# Patient Record
Sex: Female | Born: 1960 | Race: White | Hispanic: No | Marital: Married | Smoking: Never smoker
Health system: Southern US, Community
[De-identification: ages and names within clinical notes are randomized; demographics above are authoritative.]

## PROBLEM LIST (undated history)

## (undated) DIAGNOSIS — G2581 Restless legs syndrome: Secondary | ICD-10-CM

## (undated) DIAGNOSIS — K219 Gastro-esophageal reflux disease without esophagitis: Secondary | ICD-10-CM

## (undated) DIAGNOSIS — R000 Tachycardia, unspecified: Secondary | ICD-10-CM

## (undated) DIAGNOSIS — J45909 Unspecified asthma, uncomplicated: Secondary | ICD-10-CM

## (undated) DIAGNOSIS — G43909 Migraine, unspecified, not intractable, without status migrainosus: Secondary | ICD-10-CM

## (undated) DIAGNOSIS — C4491 Basal cell carcinoma of skin, unspecified: Secondary | ICD-10-CM

## (undated) DIAGNOSIS — F419 Anxiety disorder, unspecified: Secondary | ICD-10-CM

## (undated) HISTORY — DX: Anxiety disorder, unspecified: F41.9

## (undated) HISTORY — PX: ABDOMINAL HYSTERECTOMY: SHX81

## (undated) HISTORY — PX: TONSILLECTOMY: SUR1361

## (undated) HISTORY — DX: Migraine, unspecified, not intractable, without status migrainosus: G43.909

## (undated) HISTORY — DX: Gastro-esophageal reflux disease without esophagitis: K21.9

## (undated) HISTORY — DX: Basal cell carcinoma of skin, unspecified: C44.91

---

## 1999-05-15 ENCOUNTER — Other Ambulatory Visit: Admission: RE | Admit: 1999-05-15 | Discharge: 1999-05-15 | Payer: Self-pay | Admitting: Gynecology

## 2002-04-15 ENCOUNTER — Other Ambulatory Visit: Admission: RE | Admit: 2002-04-15 | Discharge: 2002-04-15 | Payer: Self-pay | Admitting: Obstetrics and Gynecology

## 2002-04-22 ENCOUNTER — Encounter: Payer: Self-pay | Admitting: Obstetrics and Gynecology

## 2002-04-22 ENCOUNTER — Ambulatory Visit (HOSPITAL_COMMUNITY): Admission: RE | Admit: 2002-04-22 | Discharge: 2002-04-22 | Payer: Self-pay | Admitting: Obstetrics and Gynecology

## 2004-09-06 ENCOUNTER — Other Ambulatory Visit: Admission: RE | Admit: 2004-09-06 | Discharge: 2004-09-06 | Payer: Self-pay | Admitting: Obstetrics and Gynecology

## 2004-10-22 ENCOUNTER — Encounter (INDEPENDENT_AMBULATORY_CARE_PROVIDER_SITE_OTHER): Payer: Self-pay | Admitting: *Deleted

## 2004-10-22 ENCOUNTER — Inpatient Hospital Stay (HOSPITAL_COMMUNITY): Admission: RE | Admit: 2004-10-22 | Discharge: 2004-10-24 | Payer: Self-pay | Admitting: Obstetrics and Gynecology

## 2004-11-27 ENCOUNTER — Ambulatory Visit (HOSPITAL_COMMUNITY): Admission: RE | Admit: 2004-11-27 | Discharge: 2004-11-27 | Payer: Self-pay | Admitting: Obstetrics and Gynecology

## 2010-02-28 ENCOUNTER — Observation Stay (HOSPITAL_COMMUNITY): Admission: EM | Admit: 2010-02-28 | Discharge: 2010-02-28 | Payer: Self-pay | Admitting: Emergency Medicine

## 2010-12-09 LAB — POCT CARDIAC MARKERS
CKMB, poc: <1 ng/mL — ABNORMAL LOW (ref 1.0–8.0)
CKMB, poc: <1 ng/mL — ABNORMAL LOW (ref 1.0–8.0)
Myoglobin, poc: 55.1 ng/mL (ref 12–200)
Myoglobin, poc: 70.6 ng/mL (ref 12–200)
Troponin i, poc: <0.05 ng/mL (ref 0.00–0.09)
Troponin i, poc: <0.05 ng/mL (ref 0.00–0.09)

## 2011-02-07 NOTE — Op Note (Signed)
NAMENAJEE, Claudia Vega               ACCOUNT NO.:  [id number]   MEDICAL RECORD NO.:  [id number]          PATIENT TYPE:  AMB   LOCATION:  SDC                           FACILITY:  [facility]   PHYSICIAN:  Zenaida Niece, M.D.DATE OF BIRTH:  1960/11/24   DATE OF PROCEDURE:  11/27/2004  DATE OF DISCHARGE:                                 OPERATIVE REPORT   PREOPERATIVE AND POSTOPERATIVE DIAGNOSES:  Dysuria and hematuria.   PROCEDURES:  Cystoscopy.   SURGEON:  Zenaida Niece, M.D.   ANESTHESIA:  Monitored anesthesia care.   ESTIMATED BLOOD LOSS:  Minimal.   FINDINGS:  The patient had a normal bladder with a slightly stenotic  urethra. No sutures were seen in the bladder or urethra.   PROCEDURE IN DETAIL:  The patient was taken to the operating room and placed  in the dorsosupine position. She was given IV sedation and placed in mobile  stirrups. Perineum was then prepped and draped in the usual sterile fashion  and bladder drained with a latex-free catheter. The 70 cystoscope was then  inserted and the bladder filled with sterile fluid. Good visualization was  achieved. The entire bladder was visualized including the ureteral orifices  and no sutures were seen in the bladder. The length of the urethra was also  inspected and no sutures were seen there. The urethra was slightly stenotic  and did have some bleeding from the cystoscopy. The cystoscope was then  removed and the bladder drained with the same latex-free catheter. The  patient was awakened in the operating room, tolerated the procedure well and  was taken to the recovery room in stable condition. Counts were correct.      TDM/MEDQ  D:  11/27/2004  T:  11/27/2004  Job:  [id number]

## 2011-02-07 NOTE — H&P (Signed)
NAMESYNCERE, KAMINSKI NO.:  [id number]   MEDICAL RECORD NO.:  [id number]           PATIENT TYPE:   LOCATION:                                 FACILITY:   PHYSICIAN:  Zenaida Niece, M.D.     DATE OF BIRTH:   DATE OF ADMISSION:  11/27/2004  DATE OF DISCHARGE:                                HISTORY & PHYSICAL   CHIEF COMPLAINT:  Persistent dysuria and hematuria, status post Burch  urethropexy.   HISTORY OF PRESENT ILLNESS:  This is a 50 year old white female, para 0-0-2-  0, who was admitted on October 22, 2004, and had a total abdominal  hysterectomy and Burch urethropexy with cystoscopy performed.  Postoperatively, she initially did well; however, she has had some fairly  persistent urinary symptoms.  She did have a urinary tract infection which  was treated with Septra; however, she continues to have dysuria and  hematuria.  A recent urine culture has returned normal.  She also complained  of flank pain.  Labs performed in the office reveal a normal white count of  8, a normal hemoglobin of 13.9, normal platelets at 245,000.  Her basic  metabolic profile was normal with a creatinine of 0.8, and urine culture  performed at that time on November 22, 2004 was negative.  Due to her persistent  urinary symptoms, and the fact that she is status post a Burch urethropexy,  I am going to take her to the operating room for cystoscopy to look for any  sutures in the bladder or urethra.   PAST OBSTETRICAL HISTORY:  Two spontaneous abortion, and a history of  infertility.   PAST MEDICAL HISTORY:  1.  Asthma.  2.  Gastroesophageal reflux disease.  3.  Restless leg syndrome.   PAST SURGICAL HISTORY:  1.  Hysteroscopy and D&C.  2.  The above-mentioned total abdominal hysterectomy and Burch procedure.   ALLERGIES:  CODEINE.   MEDICATIONS:  1.  Advair.  2.  Flonase.  3.  Singulair.  4.  Over-the-counter Alavert.  5.  Nexium.  6.  Requip.   SOCIAL HISTORY:  The  patient is married, and denies significant alcohol,  tobacco, or drug use.   FAMILY HISTORY:  No GYN or colon cancer.   PHYSICAL EXAMINATION:  VITAL SIGNS:  Weight is approximately 165 pounds.  GENERAL:  This is a well-developed, well-nourished, white female who is in  no acute distress.  NECK:  Supple without lymphadenopathy or thyromegaly.  LUNGS:  Clear to auscultation.  HEART:  Regular rate and rhythm without murmur.  ABDOMEN:  Benign with a transverse incision that is healing well.  PELVIC:  Deferred at this point due to her recent postoperative status.   ASSESSMENT:  Persistent postoperative urinary symptoms, as well as  hematuria.  She did have a Burch urethropexy, and I did perform a cystoscopy  after that procedure, and did not notice any sutures in the bladder or  urethra at that time.  However, due to her persistent symptoms and recent  negative urine culture, I am suspicious that she has a suture  in the bladder  or urethra.  The patient understands the risks of cystoscopy.   PLAN:  The plan is to take the patient to the operating room and perform  cystoscopy with possible cutting of a suture if one is present.      TDM/MEDQ  D:  11/26/2004  T:  11/26/2004  Job:  [id number]

## 2011-02-07 NOTE — Op Note (Signed)
Claudia Vega, Claudia Vega               ACCOUNT NO.:  [id number]   MEDICAL RECORD NO.:  [id number]          PATIENT TYPE:  INP   LOCATION:  9399                          FACILITY:  [facility]   PHYSICIAN:  Zenaida Niece, M.D.DATE OF BIRTH:  [birth year]   DATE OF PROCEDURE:  10/22/2004  DATE OF DISCHARGE:                                 OPERATIVE REPORT   PREOPERATIVE DIAGNOSES:  Symptomatic leiomyomatous uterus and stress urinary  incontinence.   POSTOPERATIVE DIAGNOSES:  Symptomatic leiomyomatous uterus and stress  urinary incontinence.   PROCEDURE:  Total abdominal hysterectomy and Burch urethropexy with  cystoscopy.   SURGEON:  Zenaida Niece, M.D.   ASSISTANT:  Malachi Pro. Ambrose Mantle, M.D.   ANESTHESIA:  General endotracheal tube.   ESTIMATED BLOOD LOSS:  150 cc.   SPECIMENS:  Uterus.   FINDINGS:  Approximately 10 weeks size irregular uterus with normal tubes  and ovaries. Otherwise a small serosal tear of the sigmoid found after  packing the bowel back.   PROCEDURE IN DETAIL:  The patient was taken to the operating room and placed  in the dorsosupine position. General anesthesia was induced and she was  placed in low lithotomy position in mobile stirrups. Abdomen, perineum and  vagina were then prepped and draped in the usual sterile fashion and a Foley  catheter inserted. Abdomen was then entered via standard Pfannenstiel  incision. A self-retaining retractor was placed and bowels packed out of the  pelvis. Again a small serosal tear of the sigmoid colon was noticed upon  packing the bowels out of the pelvis. The uterus was inspected and found to  be approximately 10 weeks size and irregular, but very mobile. Tubes and  ovaries were normal. Kelly clamps were placed in each uterine cornu. Both  round ligaments were divided with electrocautery and the anterior portion of  the broad ligament was dissected across the anterior portion of the uterus  and the bladder was pushed  inferior. A window was made in an avascular  portion of the broad ligament and Zeppelin clamps were used to clamp the  utero-ovarian pedicles. These were transected and doubly ligated with #1  chromic. Uterine arteries were skeletonized and clamped with Zeppelin  clamps, transected and ligated with #1 chromic. Bladder was pushed inferior  and the cardinal ligaments and uterosacral ligaments were clamped,  transected and ligated on each side with #1 chromic. Vaginal angles were  clamped with Zeppelin clamps and cut and the vagina was entered. The  remainder of the uterus and cervix was removed intact. Vaginal angles were  sutured with #1 chromic and tagged for later use. The remaining vagina was  closed with interrupted figure-of-eight sutures of #1 chromic with good  closure and adequate hemostasis. Bladder was pushed well inferior during the  case and was not felt to be near any suture lines. Uterosacral ligaments  were then plicated in the midline with 2-0 silk and the previously tagged  uterosacral pedicles were also tied in the midline. Pelvis was irrigated and  found to be hemostatic. All pedicles were inspected and found to be  hemostatic.  Ureters appeared well below any incision lines.   Attention was turned to the Burch procedure. The space of Retzius was  developed bluntly and the Foley bulb was palpated. I put an overglove on my  left hand and placed this in the vagina. Using this as a guide, I placed two  sutures of 0 Ethibond suture on each side of the urethra. These were then  tagged. I then removed this glove. The abdomen was covered and cystoscopy  was performed. This was done with a 70 degree cystoscope and the bladder  appeared normal and I was unable to identify any sutures in the bladder.  This was done after the Foley catheter was removed. Foley catheter was then  replaced. I changed my gloves and reentered the surgical field. The Burch  sutures were then placed through  Cooper's ligament on each side and tied to  elevate the bladder neck, but not under undue tension. Bleeding at the entry  site of one of the sutures on the right side into Cooper's ligament was  controlled with a figure-of-eight suture of 3-0 Vicryl. Space of Retzius was  then inspected and found to be hemostatic. The serosal tear of the sigmoid  was then repaired with several interrupted sutures of 0 silk with good  closure and good hemostasis. The packings were then removed from the abdomen  and the remainder of the bowel found to be intact. The self-retaining  retractor was removed. Subfascial space was inspected and made hemostatic  with electrocautery. Fascia was closed in running fashion starting at both  ends and meeting in the middle with 0 Vicryl. Subcutaneous tissue was then  irrigated and made hemostatic with electrocautery. Subcutaneous tissue was  closed with running 2-0 plain gut suture on a large needle. Skin was then  closed with staples and a sterile dressing. The patient tolerated the  procedure well and was taken to recovery room in stable condition. Counts  were correct x2, she received Ancef 1 gram prior to the procedure, she had  PAS hose on throughout procedure.      TDM/MEDQ  D:  10/22/2004  T:  10/22/2004  Job:  [id number]

## 2011-02-07 NOTE — H&P (Signed)
NAMEBRADLEE, Claudia Vega               ACCOUNT NO.:  [id number]   MEDICAL RECORD NO.:  [id number]          PATIENT TYPE:  INP   LOCATION:  NA                            FACILITY:  [facility]   PHYSICIAN:  Zenaida Niece, M.D.DATE OF BIRTH:  October 15, 1960   DATE OF ADMISSION:  10/22/2004  DATE OF DISCHARGE:                                HISTORY & PHYSICAL   CHIEF COMPLAINT:  Symptomatic leiomyomatous uterus and stress incontinence.   HISTORY OF PRESENT ILLNESS:  This is a 50 year old white female, gravida 2,  para 0-0-2-0, whom I saw for an annual exam in December of 2005.  She  reports that she has regular menses that are slightly heavy.  She was also  mildly anemic with a hemoglobin of 11.4.  On physical exam, she had a 10-12  week size, slightly irregular, non-tender uterus, and a grade I urethrocele  with a poor Kegel exercise.  All nonsurgical and surgical options were  discussed with the patient for her fibroid uterus, and the patient wishes to  undergo definitive surgical therapy with hysterectomy.   PAST OBSTETRICAL HISTORY:  Significant for two spontaneous abortions.  She  does have a history of infertility, and went through several courses of  ovulation induction without success.   PAST MEDICAL HISTORY:  1.  Asthma.  2.  Gastroesophageal reflux disease.  3.  Restless leg syndrome.   PAST SURGICAL HISTORY:  Hysteroscopy and D&C.   ALLERGIES:  CODEINE.   CURRENT MEDICATIONS:  1.  Advair.  2.  Flonase.  3.  Singulair.  4.  Over-the-counter Alavert.  5.  Nexium.  6.  __________.  7.  Requip injection.   SOCIAL HISTORY:  The patient is married, and denies significant alcohol,  tobacco, or drug use.  She did recently adopt.   FAMILY HISTORY:  No GYN or colon cancer.   PHYSICAL EXAMINATION:  VITAL SIGNS:  Weight is 167 pounds.  Blood pressure  is 110/82.  GENERAL:  This is a well-developed, well-nourished white female in no acute  distress.  NECK:  Supple without  lymphadenopathy or thyromegaly.  LUNGS:  Clear to auscultation.  HEART:  Regular rate and rhythm without murmur.  ABDOMEN:  Soft, nontender, nondistended, without palpable masses.  EXTREMITIES:  No edema, and are nontender.  PELVIC:  External genitalia is within normal limits.  On speculum exam, the  cervix was normal, and she does have a grade I urethrocele.  On bimanual  exam, she has a 10-12 week size slightly irregular, non-tender uterus, and  no adnexal masses.  This is confirmed on rectovaginal exam.   ASSESSMENT:  1.  Symptomatic leiomyomatous uterus.  All surgical and nonsurgical options      have been discussed with the patient, and she wishes proceed with      definitive surgical therapy with hysterectomy.  Risks of surgery      including bleeding, infection, and damage to the surrounding organs have      been discussed with the patient.  She also understands that this will      preclude the ability to have  her own child.  The patient understands      these risks, and is willing to proceed.  2.  Stress urinary incontinence.  The patient complains of stress      incontinence and does have a urethrocele.  Options have been discussed      with the patient, and she does wish to undergo a Burch procedure at the      time of her hysterectomy.   PLAN:  Admit the patient for total abdominal hysterectomy with possible  bilateral salpingo-oophorectomy.  We will leave the ovaries, unless they  appear abnormal.  We will also do a Burch procedure and a cystoscopy.      TDM/MEDQ  D:  10/21/2004  T:  10/21/2004  Job:  [id number]

## 2011-02-07 NOTE — Discharge Summary (Signed)
NAMEJACYLN, Claudia Vega               ACCOUNT NO.:  [id number]   MEDICAL RECORD NO.:  [id number]          PATIENT TYPE:  INP   LOCATION:  9315                          FACILITY:  [facility]   PHYSICIAN:  Zenaida Niece, M.D.DATE OF BIRTH:  March 28, 1961   DATE OF ADMISSION:  10/22/2004  DATE OF DISCHARGE:  10/24/2004                                 DISCHARGE SUMMARY   ADMISSION DIAGNOSES:  1.  Symptomatic leiomyomatous uterus.  2.  Stress urinary incontinence.   DISCHARGE DIAGNOSES:  1.  Symptomatic leiomyomatous uterus.  2.  Stress urinary incontinence.   PROCEDURES:  On October 22, 2004, she had a total abdominal hysterectomy  with Burch urethropexy and cystoscopy.   HISTORY OF PRESENT ILLNESS:  Please see chart for full history and physical.  Briefly, this is a 50 year old white female, gravida 2, para 0-0-2-0, with  regular heavy menses and mild anemia with a 10-12 week size slightly  irregular uterus consistent with fibroids.  She also has a grade 1  urethrocele with poor Kegel and stress incontinence.  She wishes to undergo  definitive surgical therapy.   PHYSICAL EXAMINATION:  Significant for a benign abdomen without masses.  On  pelvic exam, the uterus was 10-12 weeks' size and slightly irregular and she  had no adnexal masses.   HOSPITAL COURSE:  The patient was admitted and underwent abdominal  hysterectomy with Burch urethropexy under general anesthesia with an  estimated blood loss of 150 mL without significant complications.  Postoperatively she had no significant complications.  She was able to void  after having her Foley catheter removed.  Preoperative hemoglobin 14.8 and  postoperative 13.3.  On postoperative day #2, her incision was healing well  and her staples were removed and Steri-Strips applied.  She was felt to be  stable enough for discharge home.   DIET:  Regular.   ACTIVITY:  Pelvic rest.  No strenuous activity.   FOLLOWUP:  In two weeks for an incision  check.   DISCHARGE MEDICATIONS:  Percocet #40 one to two p.o. q.4-6h. p.r.n. pain and  she is to continue all of her preadmission medications.      TDM/MEDQ  D:  10/24/2004  T:  10/24/2004  Job:  [id number]

## 2012-01-31 ENCOUNTER — Other Ambulatory Visit: Payer: Self-pay | Admitting: Family Medicine

## 2012-02-02 ENCOUNTER — Ambulatory Visit (INDEPENDENT_AMBULATORY_CARE_PROVIDER_SITE_OTHER): Payer: Managed Care, Other (non HMO) | Admitting: Internal Medicine

## 2012-02-02 VITALS — BP 101/69 | HR 81 | Temp 98.0°F | Resp 16 | Ht 61.0 in | Wt 195.0 lb

## 2012-02-02 DIAGNOSIS — J309 Allergic rhinitis, unspecified: Secondary | ICD-10-CM | POA: Insufficient documentation

## 2012-02-02 DIAGNOSIS — R238 Other skin changes: Secondary | ICD-10-CM

## 2012-02-02 DIAGNOSIS — I10 Essential (primary) hypertension: Secondary | ICD-10-CM | POA: Insufficient documentation

## 2012-02-02 DIAGNOSIS — R5381 Other malaise: Secondary | ICD-10-CM

## 2012-02-02 DIAGNOSIS — R5383 Other fatigue: Secondary | ICD-10-CM

## 2012-02-02 DIAGNOSIS — Z6836 Body mass index (BMI) 36.0-36.9, adult: Secondary | ICD-10-CM | POA: Insufficient documentation

## 2012-02-02 DIAGNOSIS — R42 Dizziness and giddiness: Secondary | ICD-10-CM

## 2012-02-02 DIAGNOSIS — G2581 Restless legs syndrome: Secondary | ICD-10-CM | POA: Insufficient documentation

## 2012-02-02 DIAGNOSIS — K219 Gastro-esophageal reflux disease without esophagitis: Secondary | ICD-10-CM | POA: Insufficient documentation

## 2012-02-02 DIAGNOSIS — J45909 Unspecified asthma, uncomplicated: Secondary | ICD-10-CM | POA: Insufficient documentation

## 2012-02-02 LAB — POCT CBC
Granulocyte percent: 70.7 %G (ref 37–80)
HCT, POC: 40.9 % (ref 37.7–47.9)
Hemoglobin: 14.3 g/dL (ref 12.2–16.2)
Lymph, poc: 2.6 (ref 0.6–3.4)
MCHC: 35 g/dL (ref 31.8–35.4)
MCV: 87.7 fL (ref 80–97)
POC Granulocyte: 7.4 — AB (ref 2–6.9)

## 2012-02-02 LAB — COMPREHENSIVE METABOLIC PANEL
ALT: 30 U/L (ref 0–35)
AST: 17 U/L (ref 0–37)
Albumin: 4.3 g/dL (ref 3.5–5.2)
CO2: 25 mEq/L (ref 19–32)
Calcium: 9.6 mg/dL (ref 8.4–10.5)
Chloride: 101 mEq/L (ref 96–112)
Potassium: 4.2 mEq/L (ref 3.5–5.3)

## 2012-02-02 LAB — POCT SEDIMENTATION RATE: POCT SED RATE: 55 mm/hr — AB (ref 0–22)

## 2012-02-02 NOTE — Progress Notes (Signed)
  Subjective:    Patient ID: Claudia Vega, female    DOB: 1961-06-08, 51 y.o.   MRN: [id number]  HPIOne-week history of fatigue with easy bruisability Despite sleeping well she feels hypersomnolent and tired all day. Bruises are appearing on her arms that she cannot account for. There is no bleeding from the gums or the nose. This started while at weekend soccer tournament. On Monday at work she had a dizzy spell with nausea and pallor that lasted for 15 minutes.She had chills and cold sweats at the time. On Wednesday she developed fever to 101 with diarrhea that lasted for 24 hours. She had 2 more brief dizzy spells over the weekend while doing regular household chores, but never syncope or palpitations or chest pain or shortness of breath.    Review of Systems Patient Active Problem List  Diagnoses  . HTN (hypertension)  . AR (allergic rhinitis)  . RLS (restless legs syndrome)  . Adult BMI 36.0-36.9 kg/sq m  . Asthma, allergic  . GERD (gastroesophageal reflux disease)  These medical problems are stable and are currently asymptomatic She had a root canal 2 weeks ago and was treated with amoxicillin in the aftermath She denies any vision changes, upper resp. 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 Objective:   Physical ExamVital exams are stable except weight HEENT is clear including no thyromegaly or nodules Lungs are clear Heart is regular without murmurs rubs or gallops The abdomen is soft and nontender without organomegaly Extremities have no edema Skin shows bruising on the forearms both small and large without petechia or palpable purpura 1 bruise on the left hand dorsum        Results for orders placed in visit on 02/02/12  POCT CBC      Component Value Range   WBC 10.5 (*) 4.6 - 10.2 (K/uL)   Lymph, poc 2.6  0.6 - 3.4    POC LYMPH PERCENT 25.2  10 - 50 (%L)   MID (cbc) 0.4  0 - 0.9    POC MID %  4.1  0 - 12 (%M)   POC Granulocyte 7.4 (*) 2 - 6.9    Granulocyte percent 70.7  37 - 80 (%G)   RBC 4.66  4.04 - 5.48 (M/uL)   Hemoglobin 14.3  12.2 - 16.2 (g/dL)   HCT, POC 56.2  13.0 - 47.9 (%)   MCV 87.7  80 - 97 (fL)   MCH, POC 30.7  27 - 31.2 (pg)   MCHC 35.0  31.8 - 35.4 (g/dL)   RDW, POC 86.5     Platelet Count, POC 321  142 - 424 (K/uL)   MPV 8.4  0 - 99.8 (fL)    Assessment & Plan:  Problem #1 easy bruisability Problem #2 fatigue Problem #3 recent gastroenteritis with fever Problem #4 recent antibiotic therapy Problem #5 dizzy spells  Cmet/tsh/esr This likely represents a prolonged viral gastroenteritis May resume work tomorrow/should be well within 5-7 days

## 2012-02-03 LAB — TSH: TSH: 1.737 u[IU]/mL (ref 0.350–4.500)

## 2012-02-09 ENCOUNTER — Encounter: Payer: Self-pay | Admitting: Internal Medicine

## 2012-02-28 ENCOUNTER — Other Ambulatory Visit: Payer: Self-pay | Admitting: Family Medicine

## 2012-03-04 ENCOUNTER — Other Ambulatory Visit: Payer: Self-pay | Admitting: Physician Assistant

## 2012-04-06 ENCOUNTER — Other Ambulatory Visit: Payer: Self-pay | Admitting: Physician Assistant

## 2012-04-24 ENCOUNTER — Other Ambulatory Visit: Payer: Self-pay | Admitting: Family Medicine

## 2012-04-26 ENCOUNTER — Other Ambulatory Visit: Payer: Self-pay | Admitting: Internal Medicine

## 2012-04-26 NOTE — Telephone Encounter (Signed)
DENY

## 2012-07-10 ENCOUNTER — Ambulatory Visit: Payer: 59

## 2012-07-10 ENCOUNTER — Ambulatory Visit (INDEPENDENT_AMBULATORY_CARE_PROVIDER_SITE_OTHER): Payer: 59 | Admitting: Family Medicine

## 2012-07-10 VITALS — BP 110/74 | HR 91 | Temp 97.9°F | Resp 16 | Ht 60.0 in | Wt 200.4 lb

## 2012-07-10 DIAGNOSIS — R7309 Other abnormal glucose: Secondary | ICD-10-CM

## 2012-07-10 DIAGNOSIS — R739 Hyperglycemia, unspecified: Secondary | ICD-10-CM

## 2012-07-10 LAB — GLUCOSE, POCT (MANUAL RESULT ENTRY): POC Glucose: 86 mg/dl (ref 70–99)

## 2012-07-10 LAB — POCT GLYCOSYLATED HEMOGLOBIN (HGB A1C): Hemoglobin A1C: 5.8

## 2012-07-10 NOTE — Patient Instructions (Signed)
Blood sugar normal here tonight, but with hemoglobin  A1C over 5.7, at risk for developing diabetes in future.  Watch weight, exercise, watch diet choices and recheck for physical in next few months.  To look up more info on your condition, go to the website [email or web address], then on patient resources - select [facility]. Under patient resources, select diabetes or prediabetes.

## 2012-07-10 NOTE — Progress Notes (Signed)
Subjective:    Patient ID: Claudia Vega, female    DOB: [birth year], 51 y.o.   MRN: [id number]  HPI Claudia Vega is a 51 y.o. female Friend who is a nurse tested her blood sugar recently - 107 before meal between lunch and dinner (7 hours after and 156 two hours after eating. Thirsty during night at times.  No frequency.  Occasional floaters.  Just had new glasses last week.   Grandfather and great grandmother with diabetes.   Last bloodwork at physical here about a year and half ago, but had bloodwork in May: Results for orders placed in visit on 02/02/12  POCT CBC      Component Value Range   WBC 10.5 (*) 4.6 - 10.2 K/uL   Lymph, poc 2.6  0.6 - 3.4   POC LYMPH PERCENT 25.2  10 - 50 %L   MID (cbc) 0.4  0 - 0.9   POC MID % 4.1  0 - 12 %M   POC Granulocyte 7.4 (*) 2 - 6.9   Granulocyte percent 70.7  37 - 80 %G   RBC 4.66  4.04 - 5.48 M/uL   Hemoglobin 14.3  12.2 - 16.2 g/dL   HCT, POC 84.6  96.2 - 47.9 %   MCV 87.7  80 - 97 fL   MCH, POC 30.7  27 - 31.2 pg   MCHC 35.0  31.8 - 35.4 g/dL   RDW, POC 95.2     Platelet Count, POC 321  142 - 424 K/uL   MPV 8.4  0 - 99.8 fL  POCT SEDIMENTATION RATE      Component Value Range   POCT SED RATE 55 (*) 0 - 22 mm/hr  TSH      Component Value Range   TSH 1.737  0.350 - 4.500 uIU/mL  COMPREHENSIVE METABOLIC PANEL      Component Value Range   Sodium 139  135 - 145 mEq/L   Potassium 4.2  3.5 - 5.3 mEq/L   Chloride 101  96 - 112 mEq/L   CO2 25  19 - 32 mEq/L   Glucose, Bld 92  70 - 99 mg/dL   BUN 15  6 - 23 mg/dL   Creat 8.41  3.24 - 4.01 mg/dL   Total Bilirubin 0.4  0.3 - 1.2 mg/dL   Alkaline Phosphatase 96  39 - 117 U/L   AST 17  0 - 37 U/L   ALT 30  0 - 35 U/L   Total Protein 7.2  6.0 - 8.3 g/dL   Albumin 4.3  3.5 - 5.2 g/dL   Calcium 9.6  8.4 - 02.7 mg/dL   Review of Systems  As per HPI.    Objective:   Physical Exam  Vitals reviewed. Constitutional: She appears well-developed and well-nourished. No distress.  HENT:   Head: Normocephalic and atraumatic.  Eyes: EOM are normal. Pupils are equal, round, and reactive to light.  Neck: Normal range of motion.  Cardiovascular: Normal rate, regular rhythm, normal heart sounds and intact distal pulses.   Pulmonary/Chest: Effort normal and breath sounds normal.  Musculoskeletal: She exhibits no edema.  Skin: Skin is warm and dry. No erythema.  Psychiatric: She has a normal mood and affect. Her behavior is normal.   Results for orders placed in visit on 07/10/12  GLUCOSE, POCT (MANUAL RESULT ENTRY)      Component Value Range   POC Glucose 86  70 - 99 mg/dl  POCT GLYCOSYLATED HEMOGLOBIN (HGB  A1C)      Component Value Range   Hemoglobin A1C 5.8         Assessment & Plan:  Claudia Vega is a 51 y.o. female  Hyperglycemia by report - wnl here, but with A1c over 5.7, at risk for developing diabetes in future.  Discussed watching weight, exercise, diet choices and recheck for physical in next few months.  Rx for testing strips given to pt - can check few fastings and 2 hr pp for physical.

## 2012-07-13 NOTE — Progress Notes (Signed)
Left message for pt to schedule CPE. Claudia Vega

## 2012-07-15 NOTE — Progress Notes (Signed)
Letter sent to pt to schedule future CPE. Last CPE was 01/17/11. Claudia Vega

## 2012-07-18 ENCOUNTER — Other Ambulatory Visit: Payer: Self-pay | Admitting: Physician Assistant

## 2012-09-11 ENCOUNTER — Other Ambulatory Visit: Payer: Self-pay | Admitting: Physician Assistant

## 2012-10-21 ENCOUNTER — Other Ambulatory Visit: Payer: Self-pay | Admitting: Physician Assistant

## 2012-11-20 ENCOUNTER — Other Ambulatory Visit: Payer: Self-pay | Admitting: Physician Assistant

## 2013-02-25 ENCOUNTER — Other Ambulatory Visit: Payer: Self-pay | Admitting: Physician Assistant

## 2013-03-12 ENCOUNTER — Other Ambulatory Visit: Payer: Self-pay | Admitting: Physician Assistant

## 2013-04-10 ENCOUNTER — Other Ambulatory Visit: Payer: Self-pay | Admitting: Physician Assistant

## 2013-06-16 ENCOUNTER — Other Ambulatory Visit: Payer: Self-pay

## 2013-06-16 DIAGNOSIS — Z1231 Encounter for screening mammogram for malignant neoplasm of breast: Secondary | ICD-10-CM

## 2013-07-08 ENCOUNTER — Ambulatory Visit
Admission: RE | Admit: 2013-07-08 | Discharge: 2013-07-08 | Disposition: A | Discharge status: Home / Self Care | Payer: 59 | Source: Ambulatory Visit

## 2013-07-08 DIAGNOSIS — Z1231 Encounter for screening mammogram for malignant neoplasm of breast: Secondary | ICD-10-CM

## 2013-07-18 ENCOUNTER — Other Ambulatory Visit: Payer: Self-pay | Admitting: *Deleted

## 2013-07-18 ENCOUNTER — Other Ambulatory Visit: Payer: Self-pay | Admitting: Family Medicine

## 2013-07-18 DIAGNOSIS — R928 Other abnormal and inconclusive findings on diagnostic imaging of breast: Secondary | ICD-10-CM

## 2013-07-28 ENCOUNTER — Ambulatory Visit
Admission: RE | Admit: 2013-07-28 | Discharge: 2013-07-28 | Disposition: A | Discharge status: Home / Self Care | Payer: 59 | Source: Ambulatory Visit | Attending: *Deleted | Admitting: *Deleted

## 2013-07-28 DIAGNOSIS — R928 Other abnormal and inconclusive findings on diagnostic imaging of breast: Secondary | ICD-10-CM

## 2013-07-29 ENCOUNTER — Other Ambulatory Visit: Payer: Self-pay | Admitting: *Deleted

## 2013-07-31 ENCOUNTER — Other Ambulatory Visit: Payer: Self-pay | Admitting: Physician Assistant

## 2013-08-02 ENCOUNTER — Other Ambulatory Visit: Payer: 59

## 2013-11-28 ENCOUNTER — Emergency Department (HOSPITAL_COMMUNITY)
Admission: EM | Admit: 2013-11-28 | Discharge: 2013-11-28 | Disposition: A | Discharge status: Home / Self Care | Payer: 59 | Attending: Emergency Medicine | Admitting: Emergency Medicine

## 2013-11-28 ENCOUNTER — Emergency Department (HOSPITAL_COMMUNITY): Payer: 59

## 2013-11-28 ENCOUNTER — Encounter (HOSPITAL_COMMUNITY): Payer: Self-pay | Admitting: Emergency Medicine

## 2013-11-28 DIAGNOSIS — R079 Chest pain, unspecified: Secondary | ICD-10-CM | POA: Insufficient documentation

## 2013-11-28 DIAGNOSIS — Y9389 Activity, other specified: Secondary | ICD-10-CM | POA: Insufficient documentation

## 2013-11-28 DIAGNOSIS — IMO0002 Reserved for concepts with insufficient information to code with codable children: Secondary | ICD-10-CM | POA: Insufficient documentation

## 2013-11-28 DIAGNOSIS — J45909 Unspecified asthma, uncomplicated: Secondary | ICD-10-CM | POA: Insufficient documentation

## 2013-11-28 DIAGNOSIS — Z91040 Latex allergy status: Secondary | ICD-10-CM | POA: Insufficient documentation

## 2013-11-28 DIAGNOSIS — R000 Tachycardia, unspecified: Secondary | ICD-10-CM | POA: Insufficient documentation

## 2013-11-28 DIAGNOSIS — R0781 Pleurodynia: Secondary | ICD-10-CM

## 2013-11-28 DIAGNOSIS — Y92410 Unspecified street and highway as the place of occurrence of the external cause: Secondary | ICD-10-CM | POA: Insufficient documentation

## 2013-11-28 DIAGNOSIS — M549 Dorsalgia, unspecified: Secondary | ICD-10-CM | POA: Insufficient documentation

## 2013-11-28 DIAGNOSIS — G2581 Restless legs syndrome: Secondary | ICD-10-CM | POA: Insufficient documentation

## 2013-11-28 DIAGNOSIS — Z79899 Other long term (current) drug therapy: Secondary | ICD-10-CM | POA: Insufficient documentation

## 2013-11-28 HISTORY — DX: Unspecified asthma, uncomplicated: J45.909

## 2013-11-28 HISTORY — DX: Restless legs syndrome: G25.81

## 2013-11-28 HISTORY — DX: Tachycardia, unspecified: R00.0

## 2013-11-28 MED ORDER — ONDANSETRON 8 MG PO TBDP
8.0000 mg | ORAL_TABLET | Freq: Once | ORAL | Status: AC
  Administered 2013-11-28: 8 mg via ORAL
  Filled 2013-11-28: qty 1

## 2013-11-28 MED ORDER — HYDROCODONE-ACETAMINOPHEN 5-325 MG PO TABS: 2.0000 | ORAL_TABLET | ORAL | Status: DC | PRN

## 2013-11-28 MED ORDER — ONDANSETRON HCL 4 MG PO TABS: 4.0000 mg | ORAL_TABLET | Freq: Four times a day (QID) | ORAL | Status: DC

## 2013-11-28 MED ORDER — HYDROCODONE-ACETAMINOPHEN 5-325 MG PO TABS
2.0000 | ORAL_TABLET | Freq: Once | ORAL | Status: AC
  Administered 2013-11-28: 2 via ORAL
  Filled 2013-11-28: qty 2
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
## 2013-11-29 NOTE — ED Provider Notes (Signed)
Medical screening examination/treatment/procedure(s) were performed by non-physician practitioner and as supervising physician I was immediately available for consultation/collaboration.   Nykolas Bacallao L Matilyn Fehrman, MD 11/29/13 1250 

## 2013-12-19 ENCOUNTER — Encounter (HOSPITAL_COMMUNITY): Payer: Self-pay | Admitting: Emergency Medicine

## 2013-12-19 ENCOUNTER — Emergency Department (HOSPITAL_COMMUNITY)
Admission: EM | Admit: 2013-12-19 | Discharge: 2013-12-19 | Disposition: A | Discharge status: Home / Self Care | Payer: 59 | Attending: Emergency Medicine | Admitting: Emergency Medicine

## 2013-12-19 ENCOUNTER — Emergency Department (HOSPITAL_COMMUNITY): Payer: 59

## 2013-12-19 DIAGNOSIS — G2581 Restless legs syndrome: Secondary | ICD-10-CM | POA: Insufficient documentation

## 2013-12-19 DIAGNOSIS — IMO0002 Reserved for concepts with insufficient information to code with codable children: Secondary | ICD-10-CM | POA: Insufficient documentation

## 2013-12-19 DIAGNOSIS — Z91040 Latex allergy status: Secondary | ICD-10-CM | POA: Insufficient documentation

## 2013-12-19 DIAGNOSIS — J45909 Unspecified asthma, uncomplicated: Secondary | ICD-10-CM | POA: Insufficient documentation

## 2013-12-19 DIAGNOSIS — R079 Chest pain, unspecified: Secondary | ICD-10-CM

## 2013-12-19 DIAGNOSIS — Z79899 Other long term (current) drug therapy: Secondary | ICD-10-CM | POA: Insufficient documentation

## 2013-12-19 DIAGNOSIS — R42 Dizziness and giddiness: Secondary | ICD-10-CM | POA: Insufficient documentation

## 2013-12-19 LAB — CBC
HCT: 41.8 % (ref 36.0–46.0)
Hemoglobin: 14.5 g/dL (ref 12.0–15.0)
MCH: 29.7 pg (ref 26.0–34.0)
MCHC: 34.7 g/dL (ref 30.0–36.0)
MCV: 85.7 fL (ref 78.0–100.0)
PLATELETS: 296 10*3/uL (ref 150–400)
RBC: 4.88 MIL/uL (ref 3.87–5.11)
RDW: 13.8 % (ref 11.5–15.5)
WBC: 10.7 10*3/uL — ABNORMAL HIGH (ref 4.0–10.5)

## 2013-12-19 LAB — BASIC METABOLIC PANEL
BUN: 15 mg/dL (ref 6–23)
CHLORIDE: 100 meq/L (ref 96–112)
CO2: 23 mEq/L (ref 19–32)
CREATININE: 0.72 mg/dL (ref 0.50–1.10)
Calcium: 9.6 mg/dL (ref 8.4–10.5)
GFR calc non Af Amer: >90 mL/min (ref >90)
Glucose, Bld: 155 mg/dL — ABNORMAL HIGH (ref 70–99)
Potassium: 3.6 mEq/L — ABNORMAL LOW (ref 3.7–5.3)
Sodium: 139 mEq/L (ref 137–147)

## 2013-12-19 LAB — I-STAT TROPONIN, ED
TROPONIN I, POC: 0 ng/mL (ref 0.00–0.08)
Troponin i, poc: 0 ng/mL (ref 0.00–0.08)

## 2013-12-19 NOTE — Discharge Instructions (Signed)
Please follow up with your primary physician for your chest pain. You may need a stress test.  Chest Pain (Nonspecific) It is often hard to give a specific diagnosis for the cause of chest pain. There is always a chance that your pain could be related to something serious, such as a heart attack or a blood clot in the lungs. You need to follow up with your caregiver for further evaluation. CAUSES   Heartburn.  Pneumonia or bronchitis.  Anxiety or stress.  Inflammation around your heart (pericarditis) or lung (pleuritis or pleurisy).  A blood clot in the lung.  A collapsed lung (pneumothorax). It can develop suddenly on its own (spontaneous pneumothorax) or from injury (trauma) to the chest.  Shingles infection (herpes zoster virus). The chest wall is composed of bones, muscles, and cartilage. Any of these can be the source of the pain.  The bones can be bruised by injury.  The muscles or cartilage can be strained by coughing or overwork.  The cartilage can be affected by inflammation and become sore (costochondritis). DIAGNOSIS  Lab tests or other studies, such as X-rays, electrocardiography, stress testing, or cardiac imaging, may be needed to find the cause of your pain.  TREATMENT   Treatment depends on what may be causing your chest pain. Treatment may include:  Acid blockers for heartburn.  Anti-inflammatory medicine.  Pain medicine for inflammatory conditions.  Antibiotics if an infection is present.  You may be advised to change lifestyle habits. This includes stopping smoking and avoiding alcohol, caffeine, and chocolate.  You may be advised to keep your head raised (elevated) when sleeping. This reduces the chance of acid going backward from your stomach into your esophagus.  Most of the time, nonspecific chest pain will improve within 2 to 3 days with rest and mild pain medicine. HOME CARE INSTRUCTIONS   If antibiotics were prescribed, take your antibiotics as  directed. Finish them even if you start to feel better.  For the next few days, avoid physical activities that bring on chest pain. Continue physical activities as directed.  Do not smoke.  Avoid drinking alcohol.  Only take over-the-counter or prescription medicine for pain, discomfort, or fever as directed by your caregiver.  Follow your caregiver's suggestions for further testing if your chest pain does not go away.  Keep any follow-up appointments you made. If you do not go to an appointment, you could develop lasting (chronic) problems with pain. If there is any problem keeping an appointment, you must call to reschedule. SEEK MEDICAL CARE IF:   You think you are having problems from the medicine you are taking. Read your medicine instructions carefully.  Your chest pain does not go away, even after treatment.  You develop a rash with blisters on your chest. SEEK IMMEDIATE MEDICAL CARE IF:   You have increased chest pain or pain that spreads to your arm, neck, jaw, back, or abdomen.  You develop shortness of breath, an increasing cough, or you are coughing up blood.  You have severe back or abdominal pain, feel nauseous, or vomit.  You develop severe weakness, fainting, or chills.  You have a fever. THIS IS AN EMERGENCY. Do not wait to see if the pain will go away. Get medical help at once. Call your local emergency services (911 in [place] Do not drive yourself to the hospital. MAKE SURE YOU:   Understand these instructions.  Will watch your condition.  Will get help right away if you are not doing  well or get worse. Document Released: 06/18/2005 Document Revised: 12/01/2011 Document Reviewed: 04/13/2008 [facility] Patient Information 2014 [place].

## 2013-12-19 NOTE — ED Notes (Addendum)
Pt reports that she had an episode of chest pain while at work. States that she got hot and sweaty and felt a burning sensation. Reports that she was placed on Cardizem in 2006.  Reports that she was sent here from [facility] at [facility] for EKG changes.

## 2013-12-19 NOTE — ED Notes (Signed)
MD at bedside. 

## 2013-12-19 NOTE — ED Provider Notes (Signed)
CSN: [id number]     Arrival date & time 12/19/13  1847 History   First MD Initiated Contact with Patient 12/19/13 1951     Chief Complaint  Patient presents with  . Chest Pain     (Consider location/radiation/quality/duration/timing/severity/associated sxs/prior Treatment) HPI Comments: Seen at Urgent Care for CP, sent here for further eval. Received 324 mg of ASA by Urgent Care.  Patient is a 53 y.o. female presenting with chest pain. The history is provided by the patient.  Chest Pain Pain location:  Substernal area Pain quality: burning   Pain radiates to:  L arm (entire chest, with throbbing of L arm) Pain radiates to the back: no   Pain severity:  Moderate Onset quality:  Sudden Duration:  10 minutes Timing:  Constant Progression:  Resolved Chronicity:  New Context: at rest   Relieved by:  Nothing Worsened by:  Nothing tried Associated symptoms: dizziness   Associated symptoms: no abdominal pain, no back pain, no fever, no heartburn, no nausea, no shortness of breath, no syncope and not vomiting     Past Medical History  Diagnosis Date  . Asthma   . Restless leg syndrome   . Tachycardia    Past Surgical History  Procedure Laterality Date  . Abdominal hysterectomy    . Tonsillectomy     History reviewed. No pertinent family history. History  Substance Use Topics  . Smoking status: Never Smoker   . Smokeless tobacco: Never Used  . Alcohol Use: Yes     Comment: occasionally   OB History   Grav Para Term Preterm Abortions TAB SAB Ect Mult Living                 Review of Systems  Constitutional: Negative for fever.  Respiratory: Negative for shortness of breath.   Cardiovascular: Positive for chest pain. Negative for syncope.  Gastrointestinal: Negative for heartburn, nausea, vomiting and abdominal pain.  Musculoskeletal: Negative for back pain.  Neurological: Positive for dizziness.  All other systems reviewed and are negative.      Allergies   Avelox and Latex  Home Medications   Current Outpatient Rx  Name  Route  Sig  Dispense  Refill  . albuterol (PROVENTIL HFA;VENTOLIN HFA) 108 (90 BASE) MCG/ACT inhaler   Inhalation   Inhale 1 puff into the lungs every 6 (six) hours as needed for wheezing or shortness of breath.         Marland Kitchen albuterol (PROVENTIL) (2.5 MG/3ML) 0.083% nebulizer solution   Nebulization   Take 2.5 mg by nebulization every 6 (six) hours as needed for wheezing or shortness of breath.          . beclomethasone (QVAR) 80 MCG/ACT inhaler   Inhalation   Inhale 1 puff into the lungs 2 (two) times daily.          . cetirizine (ZYRTEC) 10 MG tablet   Oral   Take 10 mg by mouth every evening.         . diltiazem (DILACOR XR) 120 MG 24 hr capsule   Oral   Take 120 mg by mouth every evening.         Marland Kitchen esomeprazole (NEXIUM) 40 MG capsule   Oral   Take 40 mg by mouth every evening.          . fluticasone (FLONASE) 50 MCG/ACT nasal spray   Nasal   Place 2 sprays into the nose every morning.          Marland Kitchen  hydrochlorothiazide (MICROZIDE) 12.5 MG capsule   Oral   Take 12.5 mg by mouth every morning.         Marland Kitchen HYDROcodone-acetaminophen (NORCO/VICODIN) 5-325 MG per tablet   Oral   Take 2 tablets by mouth every 4 (four) hours as needed.   12 tablet   0   . hyoscyamine (LEVBID) 0.375 MG 12 hr tablet   Oral   Take 0.375 mg by mouth every 12 (twelve) hours as needed for cramping.         Marland Kitchen ipratropium-albuterol (DUONEB) 0.5-2.5 (3) MG/3ML SOLN   Nebulization   Take 3 mLs by nebulization every 4 (four) hours as needed (shortness of breath).          . mometasone-formoterol (DULERA) 100-5 MCG/ACT AERO   Inhalation   Inhale 2 puffs into the lungs 2 (two) times daily.         . Multiple Vitamin (MULTIVITAMIN WITH MINERALS) TABS tablet   Oral   Take 1 tablet by mouth every evening.         . ondansetron (ZOFRAN) 4 MG tablet   Oral   Take 4 mg by mouth every 6 (six) hours as needed for  nausea.         Marland Kitchen rOPINIRole (REQUIP) 2 MG tablet   Oral   Take 2 mg by mouth at bedtime.          BP 105/66  Pulse 76  Temp(Src) 98.5 F (36.9 C) (Oral)  Resp 15  Ht 5' (1.524 m)  Wt 198 lb (89.812 kg)  BMI 38.67 kg/m2  SpO2 95% Physical Exam  Nursing note and vitals reviewed. Constitutional: She is oriented to person, place, and time. She appears well-developed and well-nourished. No distress.  HENT:  Head: Normocephalic and atraumatic.  Eyes: EOM are normal. Pupils are equal, round, and reactive to light.  Neck: Normal range of motion. Neck supple.  Cardiovascular: Normal rate and regular rhythm.  Exam reveals no friction rub.   No murmur heard. Pulmonary/Chest: Effort normal and breath sounds normal. No respiratory distress. She has no wheezes. She has no rales.  Abdominal: Soft. She exhibits no distension. There is no tenderness. There is no rebound.  Musculoskeletal: Normal range of motion. She exhibits no edema.  Neurological: She is alert and oriented to person, place, and time. No cranial nerve deficit. She exhibits normal muscle tone. Coordination normal.  Skin: No rash noted. She is not diaphoretic.    ED Course  Procedures (including critical care time) Labs Review Labs Reviewed  CBC - Abnormal; Notable for the following:    WBC 10.7 (*)    All other components within normal limits  BASIC METABOLIC PANEL - Abnormal; Notable for the following:    Potassium 3.6 (*)    Glucose, Bld 155 (*)    All other components within normal limits  Randolm Idol, ED   Imaging Review Dg Chest 2 View  12/19/2013   CLINICAL DATA:  Chest pain  EXAM: CHEST  2 VIEW  COMPARISON:  November 28, 2013  FINDINGS: Lungs are clear. Heart size and pulmonary vascularity are normal. No adenopathy. No pneumothorax. No bone lesions.  IMPRESSION: No abnormality noted.   Electronically Signed   By: Lowella Grip M.D.   On: 12/19/2013 19:36     EKG Interpretation   Date/Time:  Monday  December 19 2013 18:53:37 EDT Ventricular Rate:  83 PR Interval:  152 QRS Duration: 74 QT Interval:  364 QTC Calculation: 427 R  Axis:   -7 Text Interpretation:  Normal sinus rhythm with sinus arrhythmia Low  voltage QRS Cannot rule out Anterior infarct , age undetermined Abnormal  ECG Similar to prior Confirmed by [facility]  MD, [place] ([id number]) on 12/19/2013  7:52:22 PM      MDM   Final diagnoses:  Chest pain    34F with chest pain at rest. Concerning with radiation to L arm, however described as burning. Self-limited, only one episode while at rest. No hx of cardiac disease. No SOB, nausea. Only associated dizziness. Back to baseline now.  Vitals stable. Exam benign. EKG similar to prior. With no risk factors, will do delta troponin.  Serial troponins negative. Stable for discharge, instructed to f/u with PCP for possible stress and also instructed to return if CP returns.   Osvaldo Shipper, MD 12/20/13 0001

## 2014-01-24 ENCOUNTER — Encounter: Payer: Self-pay | Admitting: Cardiovascular Disease

## 2014-01-25 ENCOUNTER — Encounter: Payer: Self-pay | Admitting: Cardiovascular Disease

## 2014-01-25 ENCOUNTER — Ambulatory Visit (INDEPENDENT_AMBULATORY_CARE_PROVIDER_SITE_OTHER): Payer: 59 | Admitting: Cardiovascular Disease

## 2014-01-25 VITALS — BP 124/80 | HR 82 | Ht 60.0 in | Wt 211.0 lb

## 2014-01-25 DIAGNOSIS — R000 Tachycardia, unspecified: Secondary | ICD-10-CM

## 2014-01-25 DIAGNOSIS — I498 Other specified cardiac arrhythmias: Secondary | ICD-10-CM

## 2014-01-25 DIAGNOSIS — R079 Chest pain, unspecified: Secondary | ICD-10-CM
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
## 2014-01-25 NOTE — Patient Instructions (Signed)
Your physician recommends that you schedule a follow-up appointment in: 6-8 weeks.   Your physician has requested that you have an echocardiogram. Echocardiography is a painless test that uses sound waves to create images of your heart. It provides your doctor with information about the size and shape of your heart and how well your heart's chambers and valves are working. This procedure takes approximately one hour. There are no restrictions for this procedure.   Your physician has requested that you have an exercise tolerance test. Can be done with NP or PA or at [facility] office.  For further information please visit [email or web address]. Please also follow instruction sheet, as given.

## 2014-02-09 ENCOUNTER — Telehealth (HOSPITAL_COMMUNITY): Payer: Self-pay

## 2014-02-10 ENCOUNTER — Encounter (HOSPITAL_COMMUNITY): Payer: 59

## 2014-02-10 ENCOUNTER — Ambulatory Visit (HOSPITAL_COMMUNITY)
Admission: RE | Admit: 2014-02-10 | Discharge: 2014-02-10 | Disposition: A | Discharge status: Home / Self Care | Payer: 59 | Source: Ambulatory Visit | Attending: Cardiovascular Disease | Admitting: Cardiovascular Disease

## 2014-02-10 ENCOUNTER — Other Ambulatory Visit: Payer: Self-pay

## 2014-02-10 ENCOUNTER — Ambulatory Visit (HOSPITAL_BASED_OUTPATIENT_CLINIC_OR_DEPARTMENT_OTHER)
Admission: RE | Admit: 2014-02-10 | Discharge: 2014-02-10 | Disposition: A | Discharge status: Home / Self Care | Payer: 59 | Source: Ambulatory Visit | Attending: Cardiovascular Disease | Admitting: Cardiovascular Disease

## 2014-02-10 DIAGNOSIS — R079 Chest pain, unspecified: Secondary | ICD-10-CM

## 2014-02-10 DIAGNOSIS — I519 Heart disease, unspecified: Secondary | ICD-10-CM

## 2014-02-10 DIAGNOSIS — R000 Tachycardia, unspecified: Secondary | ICD-10-CM

## 2014-02-10 NOTE — Progress Notes (Signed)
2D Echocardiogram Complete.  02/10/2014   Derriona Branscom, RDCS 

## 2014-02-14 ENCOUNTER — Telehealth: Payer: Self-pay | Admitting: *Deleted

## 2014-02-14 NOTE — Telephone Encounter (Signed)
Claudia Reading, MD     Sent: Tue Feb 14, 2014  8:58 AM      To: Thompson Grayer, RN           Message      Normal stress test. cdm    -------------------------------------------------------------------------------------      I placed call to pt to review stress test and echo results. Left message to call back

## 2014-02-15 NOTE — Telephone Encounter (Signed)
**Note De-Identified Claudia Vega Obfuscation** The pt is advised of her normal stress test and Echo, she verbalized understanding.   She has a f/u scheduled with Dr Angelena Form on 7/10 and wants to know if she needs to keep this appointment since her stress test and Echo was normal. Will forward message to Dr Angelena Form and his nurse, Fraser Din, to advise the pt.

## 2014-02-15 NOTE — Telephone Encounter (Signed)
She can cancel follow up if she is feeling better. Gerald Stabs

## 2014-02-15 NOTE — Telephone Encounter (Signed)
Patient is returning call to get test results, please call her at work [phone].

## 2014-02-15 NOTE — Telephone Encounter (Signed)
**Note De-Identified Daichi Moris Obfuscation** The pt states that she is feeling better and wants to cancel her f/u. Appointment canceled.

## 2014-03-30 NOTE — Telephone Encounter (Signed)
Encounter complete. 

## 2014-03-31 ENCOUNTER — Ambulatory Visit: Payer: 59 | Admitting: Cardiovascular Disease

## 2015-01-26 ENCOUNTER — Ambulatory Visit
Admission: RE | Admit: 2015-01-26 | Discharge: 2015-01-26 | Disposition: A | Discharge status: Home / Self Care | Payer: 59 | Source: Ambulatory Visit

## 2015-01-26 ENCOUNTER — Other Ambulatory Visit: Payer: Self-pay

## 2015-01-26 DIAGNOSIS — Z1231 Encounter for screening mammogram for malignant neoplasm of breast: Secondary | ICD-10-CM

## 2015-07-13 ENCOUNTER — Other Ambulatory Visit: Payer: Self-pay | Admitting: Allergy and Immunology

## 2015-07-13 MED ORDER — FLUTICASONE PROPIONATE 50 MCG/ACT NA SUSP: 1.0000 | Freq: Every day | NASAL | Status: DC
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
## 2015-07-30 ENCOUNTER — Other Ambulatory Visit: Payer: Self-pay | Admitting: *Deleted

## 2015-07-30 MED ORDER — MOMETASONE FURO-FORMOTEROL FUM 200-5 MCG/ACT IN AERO: 2.0000 | INHALATION_SPRAY | Freq: Two times a day (BID) | RESPIRATORY_TRACT | Status: DC

## 2015-11-11 ENCOUNTER — Other Ambulatory Visit: Payer: Self-pay | Admitting: Allergy and Immunology

## 2015-11-12 ENCOUNTER — Other Ambulatory Visit: Payer: Self-pay

## 2015-11-12 ENCOUNTER — Other Ambulatory Visit: Payer: Self-pay | Admitting: Allergy and Immunology

## 2015-11-12 MED ORDER — BECLOMETHASONE DIPROPIONATE 80 MCG/ACT IN AERS: 1.0000 | INHALATION_SPRAY | Freq: Two times a day (BID) | RESPIRATORY_TRACT | Status: DC

## 2015-11-12 MED ORDER — ALBUTEROL SULFATE HFA 108 (90 BASE) MCG/ACT IN AERS: 2.0000 | INHALATION_SPRAY | RESPIRATORY_TRACT | Status: DC | PRN

## 2015-11-12 NOTE — Telephone Encounter (Signed)
Last office visit3/2015.  Needs office visit for refill of Proair

## 2015-11-12 NOTE — Telephone Encounter (Signed)
Sent in medication to patient and patient notified.

## 2015-11-12 NOTE — Telephone Encounter (Signed)
Pt called and said that we denied her proair and she said that she did not need to come back for year per dr Neldon Mc. [phone]

## 2015-12-13 ENCOUNTER — Other Ambulatory Visit: Payer: Self-pay | Admitting: Allergy and Immunology

## 2016-06-16 ENCOUNTER — Other Ambulatory Visit: Payer: Self-pay | Admitting: Allergy and Immunology

## 2016-09-11 ENCOUNTER — Encounter: Payer: Self-pay | Admitting: Allergy and Immunology

## 2016-09-11 ENCOUNTER — Ambulatory Visit (INDEPENDENT_AMBULATORY_CARE_PROVIDER_SITE_OTHER): Payer: 59 | Admitting: Allergy and Immunology

## 2016-09-11 VITALS — BP 98/82 | HR 76 | Resp 18 | Ht 61.0 in | Wt 202.6 lb

## 2016-09-11 DIAGNOSIS — K219 Gastro-esophageal reflux disease without esophagitis: Secondary | ICD-10-CM

## 2016-09-11 DIAGNOSIS — J4530 Mild persistent asthma, uncomplicated: Secondary | ICD-10-CM | POA: Diagnosis not present

## 2016-09-11 DIAGNOSIS — J3089 Other allergic rhinitis: Secondary | ICD-10-CM

## 2016-09-11 MED ORDER — BECLOMETHASONE DIPROPIONATE 80 MCG/ACT IN AERS: INHALATION_SPRAY | RESPIRATORY_TRACT | 3 refills | Status: DC

## 2016-09-11 MED ORDER — FLUTICASONE PROPIONATE 50 MCG/ACT NA SUSP: NASAL | 3 refills | Status: DC

## 2016-09-11 NOTE — Patient Instructions (Addendum)
  1. Continue Qvar 80 2 inhalations twice a day and Flonase one-2 sprays each nostril once a day  2. If needed:   A. albuterol MDI or Duoneb nebulization  B. OTC antihistamine  3. "Action plan" for asthma flare up:   A. increase Qvar 80 to 3 inhalations 3 times a day  B. use albuterol if needed  4. Continue Protonix 40 mg daily  5. Return to clinic in 1 year or earlier if problem 

## 2016-09-11 NOTE — Progress Notes (Signed)
Follow-up Note  Referring Provider: Cyndy Freeze, MD Primary Provider: Cyndy Freeze, MD Date of Office Visit: 09/11/2016  Subjective:   Claudia Vega (DOB: [birth year]) is a 55 y.o. female who returns to the [facility] on 09/11/2016 in re-evaluation of the following:  HPI: Claudia Vega returns to this clinic in reevaluation of her asthma and allergic rhinitis and reflux. I have not seen her in his clinic since May 2016.  She is no longer working and stress appeared to be one other major triggers giving rise to some of her symptoms and she is much better regarding all of her respiratory tract symptoms at this point in time. She has not required a systemic steroid or an antibiotic to treat any respiratory tract issues since I've seen her in his clinic. Presently she rarely uses a short acting bronchodilator.  Her reflux is under excellent control while using her proton pump inhibitor.  She did obtain the flu vaccine this year.  Allergies as of 09/11/2016      Reactions   Latex Rash, Anaphylaxis   Avelox [moxifloxacin Hcl In Nacl] Itching, Rash      Medication List      albuterol 108 (90 Base) MCG/ACT inhaler Commonly known as:  PROAIR HFA Inhale 2 puffs into the lungs every 4 (four) hours as needed for wheezing or shortness of breath.   albuterol (2.5 MG/3ML) 0.083% nebulizer solution Commonly known as:  PROVENTIL Take 2.5 mg by nebulization every 6 (six) hours as needed for wheezing or shortness of breath.   beclomethasone 80 MCG/ACT inhaler Commonly known as:  QVAR Inhale two puffs twice daily to prevent cough or wheeze. Increase to three puffs three times daily during flare-up.  Rinse, gargle, and spit   cetirizine 10 MG tablet Commonly known as:  ZYRTEC Take 10 mg by mouth every evening.   diltiazem 120 MG 24 hr capsule Commonly known as:  CARDIZEM CD TAKE ONE CAPSULE BY MOUTH AT BEDTIME   fluticasone 50 MCG/ACT nasal spray Commonly known  as:  FLONASE Use one spray in each nostril once daily.   hydrochlorothiazide 12.5 MG capsule Commonly known as:  MICROZIDE Take 12.5 mg by mouth every morning.   ipratropium-albuterol 0.5-2.5 (3) MG/3ML Soln Commonly known as:  DUONEB Take 3 mLs by nebulization every 4 (four) hours as needed (shortness of breath).   mometasone-formoterol 200-5 MCG/ACT Aero Commonly known as:  DULERA Inhale 2 puffs into the lungs 2 (two) times daily.   multivitamin with minerals Tabs tablet Take 1 tablet by mouth every evening.   pantoprazole 40 MG tablet Commonly known as:  PROTONIX at bedtime.   pramipexole 1 MG tablet Commonly known as:  MIRAPEX Take 1 mg by mouth at bedtime.   PRISTIQ 50 MG 24 hr tablet Generic drug:  desvenlafaxine   topiramate 50 MG tablet Commonly known as:  TOPAMAX       Past Medical History:  Diagnosis Date  . Asthma   . Restless leg syndrome   . Tachycardia     Past Surgical History:  Procedure Laterality Date  . ABDOMINAL HYSTERECTOMY    . TONSILLECTOMY      Review of systems negative except as noted in HPI / PMHx or noted below:  Review of Systems  Constitutional: Negative.   HENT: Negative.   Eyes: Negative.   Respiratory: Negative.   Cardiovascular: Negative.   Gastrointestinal: Negative.   Genitourinary: Negative.   Musculoskeletal: Negative.   Skin: Negative.   Neurological:  Negative.   Endo/Heme/Allergies: Negative.   Psychiatric/Behavioral: Negative.      Objective:   Vitals:   09/11/16 1054  BP: 98/82  Pulse: 76  Resp: 18   Height: 5\' 1"  (154.9 cm)  Weight: 202 lb 9.6 oz (91.9 kg)   Physical Exam  Constitutional: She is well-developed, well-nourished, and in no distress.  HENT:  Head: Normocephalic.  Right Ear: Tympanic membrane, external ear and ear canal normal.  Left Ear: Tympanic membrane, external ear and ear canal normal.  Nose: Nose normal. No mucosal edema or rhinorrhea.  Mouth/Throat: Uvula is midline,  oropharynx is clear and moist and mucous membranes are normal. No oropharyngeal exudate.  Eyes: Conjunctivae are normal.  Neck: Trachea normal. No tracheal tenderness present. No tracheal deviation present. No thyromegaly present.  Cardiovascular: Normal rate, regular rhythm, S1 normal, S2 normal and normal heart sounds.   No murmur heard. Pulmonary/Chest: Breath sounds normal. No stridor. No respiratory distress. She has no wheezes. She has no rales.  Musculoskeletal: She exhibits no edema.  Lymphadenopathy:       Head (right side): No tonsillar adenopathy present.       Head (left side): No tonsillar adenopathy present.    She has no cervical adenopathy.  Neurological: She is alert. Gait normal.  Skin: No rash noted. She is not diaphoretic. No erythema. Nails show no clubbing.  Psychiatric: Mood and affect normal.    Diagnostics:    Spirometry was performed and demonstrated an FEV1 of 2.19 at 92 % of predicted.  Assessment and Plan:   1. Asthma, well controlled, mild persistent   2. Other allergic rhinitis   3. Gastroesophageal reflux disease, esophagitis presence not specified     1. Continue Qvar 80 2 inhalations twice a day and Flonase one-2 sprays each nostril once a day  2. If needed:   A. albuterol MDI or Duoneb nebulization  B. OTC antihistamine  3. "Action plan" for asthma flare up:   A. increase Qvar 80 to 3 inhalations 3 times a day  B. use albuterol if needed  4. Continue Protonix 40 mg daily  5. Return to clinic in 1 year or earlier if problem  Claudia Vega appears to be doing quite well regarding her atopic respiratory disease and we will continue to have her use a combination of Qvar and Flonase at this point. Her reflux also appears to be under good control while using her proton pump inhibitor and we'll continue her on this agent. She has an action plan to initiate which would be high dose Qvar for an asthma flare as noted above. If she does well I will see  her back in this clinic in 1 year or earlier if there is a problem.  Claudia Katz, MD [place]

## 2016-10-09 ENCOUNTER — Ambulatory Visit: Payer: 59 | Admitting: Allergy and Immunology

## 2016-11-10 ENCOUNTER — Other Ambulatory Visit: Payer: Self-pay

## 2016-11-10 MED ORDER — BECLOMETHASONE DIPROP HFA 80 MCG/ACT IN AERB: 2.0000 | INHALATION_SPRAY | Freq: Two times a day (BID) | RESPIRATORY_TRACT | 3 refills | Status: DC

## 2016-11-12 ENCOUNTER — Other Ambulatory Visit: Payer: Self-pay

## 2016-11-12 MED ORDER — BECLOMETHASONE DIPROP HFA 80 MCG/ACT IN AERB: 2.0000 | INHALATION_SPRAY | Freq: Two times a day (BID) | RESPIRATORY_TRACT | 3 refills | Status: DC

## 2016-11-14 ENCOUNTER — Other Ambulatory Visit: Payer: Self-pay

## 2016-11-14 MED ORDER — FLUTICASONE PROPIONATE HFA 110 MCG/ACT IN AERO: 2.0000 | INHALATION_SPRAY | Freq: Two times a day (BID) | RESPIRATORY_TRACT | 12 refills | Status: DC

## 2017-10-07 ENCOUNTER — Other Ambulatory Visit: Payer: Self-pay | Admitting: Allergy and Immunology

## 2017-10-13 ENCOUNTER — Other Ambulatory Visit: Payer: Self-pay

## 2017-10-13 MED ORDER — BECLOMETHASONE DIPROP HFA 80 MCG/ACT IN AERB: INHALATION_SPRAY | RESPIRATORY_TRACT | 0 refills | Status: DC

## 2017-10-14 ENCOUNTER — Other Ambulatory Visit: Payer: Self-pay

## 2017-10-14 NOTE — Telephone Encounter (Signed)
Declined flovent 110 mcg pt. Needs ov. Last seen 08/2016

## 2017-11-03 ENCOUNTER — Other Ambulatory Visit: Payer: Self-pay

## 2017-11-03 NOTE — Telephone Encounter (Signed)
Denied proair hfa refill. Pt. Hasn't been seen since 09/11/2016. Pt. Needs office visit. fax'd request back to [facility]. [place]. [phone].

## 2018-01-14 ENCOUNTER — Encounter: Payer: Self-pay | Admitting: Allergy and Immunology

## 2018-01-14 ENCOUNTER — Ambulatory Visit: Payer: Managed Care, Other (non HMO) | Admitting: Allergy and Immunology

## 2018-01-14 VITALS — BP 124/70 | HR 94 | Resp 17 | Ht 60.0 in | Wt 200.4 lb

## 2018-01-14 DIAGNOSIS — J3089 Other allergic rhinitis: Secondary | ICD-10-CM

## 2018-01-14 DIAGNOSIS — J4530 Mild persistent asthma, uncomplicated: Secondary | ICD-10-CM | POA: Diagnosis not present

## 2018-01-14 DIAGNOSIS — K219 Gastro-esophageal reflux disease without esophagitis: Secondary | ICD-10-CM

## 2018-01-14 MED ORDER — ALBUTEROL SULFATE HFA 108 (90 BASE) MCG/ACT IN AERS: INHALATION_SPRAY | RESPIRATORY_TRACT | 1 refills | Status: AC

## 2018-01-14 MED ORDER — BECLOMETHASONE DIPROP HFA 80 MCG/ACT IN AERB: INHALATION_SPRAY | RESPIRATORY_TRACT | 11 refills | Status: DC

## 2018-01-14 MED ORDER — PANTOPRAZOLE SODIUM 40 MG PO TBEC: DELAYED_RELEASE_TABLET | ORAL | 11 refills | Status: DC

## 2018-01-14 MED ORDER — CETIRIZINE HCL 10 MG PO TABS: ORAL_TABLET | ORAL | 11 refills | Status: DC

## 2018-01-14 MED ORDER — FLUTICASONE PROPIONATE 50 MCG/ACT NA SUSP: NASAL | 3 refills | Status: DC

## 2018-01-14 NOTE — Patient Instructions (Signed)
  1. Continue Qvar 80 2 inhalations twice a day and Flonase one-2 sprays each nostril once a day  2. If needed:   A. albuterol MDI or Duoneb nebulization  B. OTC antihistamine  3. "Action plan" for asthma flare up:   A. increase Qvar 80 to 3 inhalations 3 times a day  B. use albuterol if needed  4. Continue Protonix 40 mg daily  5. Return to clinic in 1 year or earlier if problem

## 2018-01-14 NOTE — Progress Notes (Signed)
Follow-up Note  Referring Provider: Cyndy Freeze, MD Primary Provider: Cyndy Freeze, MD Date of Office Visit: 01/14/2018  Subjective:   Claudia Vega (DOB: 10/08/1960) is a 57 y.o. female who returns to the [facility] [place] on 01/14/2018 in re-evaluation of the following:  HPI: [place] return to this clinic in reevaluation of her asthma and allergic rhinitis and reflux.  Her last visit to this clinic was 11 September 2016.  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  Otherwise she has not required the use of a short acting bronchodilator and she can exert herself without any problem.  For the past 2 weeks she did run out of her Qvar and she has noticed a little bit of a problem with some cough.  She has had no problems with her nose.  Her reflux is under excellent control while using her proton pump inhibitor.  She did obtain the flu vaccine this past year.  Allergies as of 01/14/2018      Reactions   Latex Rash, Anaphylaxis   Avelox [moxifloxacin Hcl In Nacl] Itching, Rash      Medication List      albuterol 108 (90 Base) MCG/ACT inhaler Commonly known as:  PROAIR HFA Inhale 2 puffs into the lungs every 4 (four) hours as needed for wheezing or shortness of breath.   albuterol (2.5 MG/3ML) 0.083% nebulizer solution Commonly known as:  PROVENTIL Take 2.5 mg by nebulization every 6 (six) hours as needed for wheezing or shortness of breath.   amphetamine-dextroamphetamine 30 MG 24 hr capsule Commonly known as:  ADDERALL XR Take 30 mg by mouth 2 (two) times daily.   beclomethasone 80 MCG/ACT inhaler Commonly known as:  QVAR REDIHALER Inhale two doses twice daily as directed.  May increase to 3 inhalations three times a day during asthma flare.   cetirizine 10 MG  tablet Commonly known as:  ZYRTEC Take 10 mg by mouth every evening.   diltiazem 120 MG 24 hr capsule Commonly known as:  CARDIZEM CD TAKE ONE CAPSULE BY MOUTH AT BEDTIME   fluticasone 50 MCG/ACT nasal spray Commonly known as:  FLONASE Use one spray in each nostril once daily.   hydrochlorothiazide 12.5 MG capsule Commonly known as:  MICROZIDE Take 12.5 mg by mouth every morning.   ipratropium-albuterol 0.5-2.5 (3) MG/3ML Soln Commonly known as:  DUONEB Take 3 mLs by nebulization every 4 (four) hours as needed (shortness of breath).   lisdexamfetamine 20 MG capsule Commonly known as:  VYVANSE Take by mouth.   multivitamin with minerals Tabs tablet Take 1 tablet by mouth every evening.   pantoprazole 40 MG tablet Commonly known as:  PROTONIX at bedtime.   pramipexole 1 MG tablet Commonly known as:  MIRAPEX Take 1 mg by mouth at bedtime.       Past Medical History:  Diagnosis Date  . Asthma   . Restless leg syndrome   . Tachycardia     Past Surgical History:  Procedure Laterality Date  . ABDOMINAL HYSTERECTOMY    . TONSILLECTOMY      Review of systems negative except as noted in HPI / PMHx or noted below:  Review of Systems  Constitutional: Negative.   HENT: Negative.   Eyes: Negative.   Respiratory: Negative.   Cardiovascular: Negative.   Gastrointestinal: Negative.  Genitourinary: Negative.   Musculoskeletal: Negative.   Skin: Negative.   Neurological: Negative.   Endo/Heme/Allergies: Negative.   Psychiatric/Behavioral: Negative.      Objective:   Vitals:   01/14/18 1653  BP: 124/70  Pulse: 94  Resp: 17  SpO2: 92%   Height: 5' (152.4 cm)  Weight: 200 lb 6.4 oz (90.9 kg)   Physical Exam  HENT:  Head: Normocephalic.  Right Ear: Tympanic membrane, external ear and ear canal normal.  Left Ear: Tympanic membrane, external ear and ear canal normal.  Nose: Nose normal. No mucosal edema or rhinorrhea.  Mouth/Throat: Uvula is midline,  oropharynx is clear and moist and mucous membranes are normal. No oropharyngeal exudate.  Eyes: Conjunctivae are normal.  Neck: Trachea normal. No tracheal tenderness present. No tracheal deviation present. No thyromegaly present.  Cardiovascular: Normal rate, regular rhythm, S1 normal, S2 normal and normal heart sounds.  No murmur heard. Pulmonary/Chest: Breath sounds normal. No stridor. No respiratory distress. She has no wheezes. She has no rales.  Musculoskeletal: She exhibits no edema.  Lymphadenopathy:       Head (right side): No tonsillar adenopathy present.       Head (left side): No tonsillar adenopathy present.    She has no cervical adenopathy.  Neurological: She is alert.  Skin: No rash noted. She is not diaphoretic. No erythema. Nails show no clubbing.    Diagnostics:    Spirometry was performed and demonstrated an FEV1 of 2.17 at 98 % of predicted.  The patient had an Asthma Control Test with the following results: ACT Total Score: 16.    Assessment and Plan:   1. Asthma, well controlled, mild persistent   2. Other allergic rhinitis   3. Gastroesophageal reflux disease, esophagitis presence not specified     1. Continue Qvar 80 2 inhalations twice a day and Flonase one-2 sprays each nostril once a day  2. If needed:   A. albuterol MDI or Duoneb nebulization  B. OTC antihistamine  3. "Action plan" for asthma flare up:   A. increase Qvar 80 to 3 inhalations 3 times a day  B. use albuterol if needed  4. Continue Protonix 40 mg daily  5. Return to clinic in 1 year or earlier if problem  Matia really appears to be doing quite well on her current therapy and we will renew her nasal and inhaled steroid and her proton pump inhibitor and I will see her back in this clinic in approximately 1 year or earlier if there is a problem.  [name], MD Allergy / Immunology [place]

## 2018-01-18 ENCOUNTER — Encounter: Payer: Self-pay | Admitting: Allergy and Immunology

## 2018-11-07 ENCOUNTER — Emergency Department (HOSPITAL_COMMUNITY): Payer: BC Managed Care – PPO

## 2018-11-07 ENCOUNTER — Other Ambulatory Visit: Payer: Self-pay

## 2018-11-07 ENCOUNTER — Emergency Department (HOSPITAL_COMMUNITY)
Admission: EM | Admit: 2018-11-07 | Discharge: 2018-11-07 | Disposition: A | Discharge status: Home / Self Care | Payer: BC Managed Care – PPO | Attending: Emergency Medicine | Admitting: Emergency Medicine

## 2018-11-07 DIAGNOSIS — I10 Essential (primary) hypertension: Secondary | ICD-10-CM | POA: Insufficient documentation

## 2018-11-07 DIAGNOSIS — R509 Fever, unspecified: Secondary | ICD-10-CM | POA: Diagnosis present

## 2018-11-07 DIAGNOSIS — J45909 Unspecified asthma, uncomplicated: Secondary | ICD-10-CM | POA: Diagnosis not present

## 2018-11-07 DIAGNOSIS — J189 Pneumonia, unspecified organism: Secondary | ICD-10-CM | POA: Diagnosis not present

## 2018-11-07 DIAGNOSIS — Z79899 Other long term (current) drug therapy: Secondary | ICD-10-CM | POA: Insufficient documentation

## 2018-11-07 MED ORDER — PREDNISONE 20 MG PO TABS: ORAL_TABLET | ORAL | 0 refills | Status: DC

## 2018-11-07 MED ORDER — IBUPROFEN 400 MG PO TABS
400.0000 mg | ORAL_TABLET | Freq: Once | ORAL | Status: AC
  Administered 2018-11-07: 400 mg via ORAL
  Filled 2018-11-07: qty 1

## 2018-11-07 MED ORDER — PREDNISONE 20 MG PO TABS
60.0000 mg | ORAL_TABLET | Freq: Once | ORAL | Status: AC
  Administered 2018-11-07: 60 mg via ORAL
  Filled 2018-11-07: qty 3

## 2018-11-07 MED ORDER — IPRATROPIUM-ALBUTEROL 0.5-2.5 (3) MG/3ML IN SOLN: 3.0000 mL | RESPIRATORY_TRACT | 0 refills | Status: AC | PRN

## 2018-11-07 MED ORDER — IPRATROPIUM-ALBUTEROL 0.5-2.5 (3) MG/3ML IN SOLN
3.0000 mL | Freq: Once | RESPIRATORY_TRACT | Status: AC
  Administered 2018-11-07: 3 mL via RESPIRATORY_TRACT
  Filled 2018-11-07: qty 3

## 2018-11-07 MED ORDER — AZITHROMYCIN 250 MG PO TABS
500.0000 mg | ORAL_TABLET | Freq: Once | ORAL | Status: AC
  Administered 2018-11-07: 500 mg via ORAL
  Filled 2018-11-07: qty 2

## 2018-11-07 MED ORDER — AZITHROMYCIN 250 MG PO TABS: 250.0000 mg | ORAL_TABLET | Freq: Every day | ORAL | 0 refills | Status: DC

## 2018-11-07 NOTE — ED Notes (Signed)
Patient transported to X-ray 

## 2018-11-07 NOTE — ED Provider Notes (Addendum)
[facility] EMERGENCY DEPARTMENT Provider Note   CSN: [id number] Arrival date & time: 11/07/18  1355     History   Chief Complaint Chief Complaint  Patient presents with  . Fever    HPI Claudia Vega is a 58 y.o. female.  Patient is a 58 year old female with a history of hypertension, restless leg syndrome, asthma and tachycardia who presents with cough and fever.  She has had URI symptoms for the last 3 to 4 days.  This includes coughing which is mostly dry cough and some low-grade fevers.  She is also had some increased wheezing and shortness of breath associated with her asthma.  She has a nebulizer treatment which he use this morning with no significant improvement in symptoms.  She denies any vomiting or diarrhea.  No associated chest pain.  She was seen in urgent care earlier today and was told she had pneumonia.  She was given a shot of Rocephin and a prescription for cefdinir was sent to the pharmacy which they have not yet picked up.  She also has a prescription for cough medicine that she has not picked up yet.  Her fever got higher and she became more wheezy and came to the emergency room for evaluation.  She has received a flu shot this year and she did have a negative flu swab at urgent care.     Past Medical History:  Diagnosis Date  . Asthma   . Restless leg syndrome   . Tachycardia     Patient Active Problem List   Diagnosis Date Noted  . HTN (hypertension) 02/02/2012  . AR (allergic rhinitis) 02/02/2012  . RLS (restless legs syndrome) 02/02/2012  . Adult BMI 36.0-36.9 kg/sq m 02/02/2012  . Asthma, allergic 02/02/2012  . GERD (gastroesophageal reflux disease) 02/02/2012    Past Surgical History:  Procedure Laterality Date  . ABDOMINAL HYSTERECTOMY    . TONSILLECTOMY       OB History   No obstetric history on file.      Home Medications    Prior to Admission medications   Medication Sig Start Date End Date Taking? Authorizing  Provider  albuterol (PROAIR HFA) 108 (90 Base) MCG/ACT inhaler Inhale two puffs every four to six hours as needed for cough or wheeze. 01/14/18   Kozlow, Donnamarie Poag, MD  albuterol (PROVENTIL) (2.5 MG/3ML) 0.083% nebulizer solution Take 2.5 mg by nebulization every 6 (six) hours as needed for wheezing or shortness of breath.     [provider]  amphetamine-dextroamphetamine (ADDERALL XR) 30 MG 24 hr capsule Take 30 mg by mouth 2 (two) times daily. 01/01/18   [provider]  azithromycin (ZITHROMAX) 250 MG tablet Take 1 tablet (250 mg total) by mouth daily. Take  1 every day until finished. 11/07/18   Malvin Johns, MD  beclomethasone (QVAR REDIHALER) 80 MCG/ACT inhaler Inhale two doses twice daily as directed.  May increase to 3 inhalations three times a day during asthma flare. 01/14/18   Kozlow, Donnamarie Poag, MD  cetirizine (ZYRTEC) 10 MG tablet Can take one tablet once daily if needed. 01/14/18   Kozlow, Donnamarie Poag, MD  diltiazem (CARDIZEM CD) 120 MG 24 hr capsule TAKE ONE CAPSULE BY MOUTH AT BEDTIME 08/12/16   [provider]  fluticasone (FLONASE) 50 MCG/ACT nasal spray Use one to two sprays in each nostril once daily. 01/14/18   Kozlow, Donnamarie Poag, MD  hydrochlorothiazide (MICROZIDE) 12.5 MG capsule Take 12.5 mg by mouth every morning.  [provider]  ipratropium-albuterol (DUONEB) 0.5-2.5 (3) MG/3ML SOLN Take 3 mLs by nebulization every 4 (four) hours as needed. 11/07/18   Malvin Johns, MD  lisdexamfetamine (VYVANSE) 20 MG capsule Take by mouth.    [provider]  Multiple Vitamin (MULTIVITAMIN WITH MINERALS) TABS tablet Take 1 tablet by mouth every evening.    [provider]  pantoprazole (PROTONIX) 40 MG tablet Take one tablet by mouth once daily. 01/14/18   Kozlow, Donnamarie Poag, MD  pramipexole (MIRAPEX) 1 MG tablet Take 1 mg by mouth at bedtime.    [provider]  predniSONE (DELTASONE) 20 MG tablet 2 tabs po daily x 4 days 11/07/18   Malvin Johns,  MD    Family History Family History  Problem Relation Age of Onset  . Hypertension Maternal Grandmother   . Hypertension Mother   . CAD Neg Hx     Social History Social History   Tobacco Use  . Smoking status: Never Smoker  . Smokeless tobacco: Never Used  Substance Use Topics  . Alcohol use: Yes    Comment: occasionally  . Drug use: No     Allergies   Latex and Avelox [moxifloxacin hcl in nacl]   Review of Systems Review of Systems  Constitutional: Positive for fatigue and fever. Negative for chills and diaphoresis.  HENT: Negative for congestion, rhinorrhea and sneezing.   Eyes: Negative.   Respiratory: Positive for cough, shortness of breath and wheezing. Negative for chest tightness.   Cardiovascular: Negative for chest pain and leg swelling.  Gastrointestinal: Negative for abdominal pain, blood in stool, diarrhea, nausea and vomiting.  Genitourinary: Negative for difficulty urinating, flank pain, frequency and hematuria.  Musculoskeletal: Negative for arthralgias and back pain.  Skin: Negative for rash.  Neurological: Negative for dizziness, speech difficulty, weakness, numbness and headaches.     Physical Exam Updated Vital Signs BP 119/81 (BP Location: Right Arm)   Pulse (!) 109   Temp (!) 102.6 F (39.2 C) (Oral)   Resp 16   Ht 5' (1.524 m)   Wt 88 kg   SpO2 94%   BMI 37.89 kg/m   Physical Exam Constitutional:      Appearance: She is well-developed.  HENT:     Head: Normocephalic and atraumatic.     Right Ear: Tympanic membrane normal.     Left Ear: Tympanic membrane normal.     Mouth/Throat:     Mouth: Mucous membranes are moist.     Pharynx: No oropharyngeal exudate or posterior oropharyngeal erythema.  Eyes:     Pupils: Pupils are equal, round, and reactive to light.  Neck:     Musculoskeletal: Normal range of motion and neck supple.  Cardiovascular:     Rate and Rhythm: Regular rhythm. Tachycardia present.     Heart sounds: Normal  heart sounds.  Pulmonary:     Effort: Pulmonary effort is normal. Tachypnea present. No respiratory distress.     Breath sounds: Wheezing present. No rales.  Chest:     Chest wall: No tenderness.  Abdominal:     General: Bowel sounds are normal.     Palpations: Abdomen is soft.     Tenderness: There is no abdominal tenderness. There is no guarding or rebound.  Musculoskeletal: Normal range of motion.  Lymphadenopathy:     Cervical: No cervical adenopathy.  Skin:    General: Skin is warm and dry.     Findings: No rash.  Neurological:     Mental Status: She  is alert and oriented to person, place, and time.      ED Treatments / Results  Labs (all labs ordered are listed, but only abnormal results are displayed) Labs Reviewed - No data to display  EKG None  Radiology Dg Chest 2 View  Result Date: 11/07/2018 CLINICAL DATA:  Dry cough for 2 days.  Fever yesterday. EXAM: CHEST - 2 VIEW COMPARISON:  Radiographs 12/19/2013. FINDINGS: The heart size and mediastinal contours are stable. There is new ill-defined right perihilar opacity on the frontal examination, not well seen on the lateral view. The left lung is clear. There is no pleural effusion or pneumothorax. No acute osseous findings are evident. IMPRESSION: New ill-defined right perihilar opacity, possible early or partially treated pneumonia. Followup PA and lateral chest X-ray is recommended in 3-4 weeks following trial of antibiotic therapy to ensure resolution and exclude underlying malignancy. Electronically Signed   By: Richardean Sale M.D.   On: 11/07/2018 15:33    Procedures Procedures (including critical care time)  Medications Ordered in ED Medications  azithromycin (ZITHROMAX) tablet 500 mg (has no administration in time range)  ipratropium-albuterol (DUONEB) 0.5-2.5 (3) MG/3ML nebulizer solution 3 mL (3 mLs Nebulization Given 11/07/18 1449)  predniSONE (DELTASONE) tablet 60 mg (60 mg Oral Given 11/07/18 1449)    ibuprofen (ADVIL,MOTRIN) tablet 400 mg (400 mg Oral Given 11/07/18 1448)     Initial Impression / Assessment and Plan / ED Course  I have reviewed the triage vital signs and the nursing notes.  Pertinent labs & imaging results that were available during my care of the patient were reviewed by me and considered in my medical decision making (see chart for details).     Patient is a 58 year old female who presents with cough and a febrile illness.  She has some exacerbation of her asthma as well.  She was given a dose of prednisone and nebulizer treatment in the ED and feels 100% better after that.  She currently denies any shortness of breath.  She is maintaining normal oxygen saturations on room air.  She has no tachypnea or increased work of breathing.  Her chest x-ray does show a right early infiltrate.  However she is otherwise well-appearing I feel that is appropriate to do an outpatient trial.  She received a dose of Rocephin earlier today.  She also got a prescription for Ceftin ear.  I will also add Zithromax to cover atypicals.  I will start her on a short course of prednisone and gave her a refill on her DuoNeb nebulizer solution.  She was instructed to follow-up with her PCP within the next 2 to 3 days for recheck per return here as needed if she has any worsening symptoms.  She was given strict return precautions.  She is also advised that she needs a follow-up chest x-ray following this illness to ensure resolution of the chest x-ray findings.  Her heart rate at discharge is in the low 100s.  She says this is normal for her and that she takes diltiazem for tachycardia.  She states it is normally higher when she is sick.  She is currently well-appearing and was discharged with above treatment.  Final Clinical Impressions(s) / ED Diagnoses   Final diagnoses:  Community acquired pneumonia of right lung, unspecified part of lung    ED Discharge Orders         Ordered    azithromycin  (ZITHROMAX) 250 MG tablet  Daily     11/07/18 1556  ipratropium-albuterol (DUONEB) 0.5-2.5 (3) MG/3ML SOLN  Every 4 hours PRN     11/07/18 1556    predniSONE (DELTASONE) 20 MG tablet     11/07/18 1559           Malvin Johns, MD 11/07/18 1607    Malvin Johns, MD 11/07/18 1607

## 2018-11-07 NOTE — ED Triage Notes (Signed)
Pt to ED for evaluation of dry cough x 2 days, fever yesterday and today. Was seen at [facility] and dx/tx for pneumonia this morning and told to come here if her fever increased. 102.4 in triage with Tylenol 1 hour ago. Recently been around children dx with flu but tested negative.

## 2018-11-25 ENCOUNTER — Ambulatory Visit (INDEPENDENT_AMBULATORY_CARE_PROVIDER_SITE_OTHER): Payer: BC Managed Care – PPO | Admitting: Allergy and Immunology

## 2018-11-25 ENCOUNTER — Encounter: Payer: Self-pay | Admitting: Allergy and Immunology

## 2018-11-25 VITALS — BP 134/84 | HR 92 | Resp 16

## 2018-11-25 DIAGNOSIS — J4540 Moderate persistent asthma, uncomplicated: Secondary | ICD-10-CM

## 2018-11-25 DIAGNOSIS — G933 Postviral fatigue syndrome: Secondary | ICD-10-CM | POA: Diagnosis not present

## 2018-11-25 DIAGNOSIS — G9331 Postviral fatigue syndrome: Secondary | ICD-10-CM

## 2018-11-25 DIAGNOSIS — K219 Gastro-esophageal reflux disease without esophagitis: Secondary | ICD-10-CM | POA: Diagnosis not present

## 2018-11-25 DIAGNOSIS — J3089 Other allergic rhinitis: Secondary | ICD-10-CM

## 2018-11-25 MED ORDER — PANTOPRAZOLE SODIUM 40 MG PO TBEC: DELAYED_RELEASE_TABLET | ORAL | 11 refills | Status: AC

## 2018-11-25 MED ORDER — BUDESONIDE-FORMOTEROL FUMARATE 160-4.5 MCG/ACT IN AERO: INHALATION_SPRAY | RESPIRATORY_TRACT | 5 refills | Status: DC

## 2018-11-25 MED ORDER — MONTELUKAST SODIUM 10 MG PO TABS: 10.0000 mg | ORAL_TABLET | Freq: Every day | ORAL | 11 refills | Status: DC

## 2018-11-25 MED ORDER — TIOTROPIUM BROMIDE MONOHYDRATE 1.25 MCG/ACT IN AERS: 2.0000 | INHALATION_SPRAY | Freq: Every day | RESPIRATORY_TRACT | 5 refills | Status: DC

## 2018-11-25 MED ORDER — FLUTICASONE PROPIONATE 50 MCG/ACT NA SUSP: NASAL | 11 refills | Status: DC

## 2018-11-25 NOTE — Progress Notes (Signed)
[place]   Follow-up Note  Referring Provider: Cyndy Freeze, MD Primary Provider: Cyndy Freeze, MD Date of Office Visit: 11/25/2018  Subjective:   Claudia Vega (DOB: 01-Jan-1961) is a 58 y.o. female who returns to the [facility] [place] on 11/25/2018 in re-evaluation of the following:  HPI: Claudia Vega presents to this clinic in evaluation of her asthma and allergic rhinitis and reflux.  Her last visit to this clinic was 14 January 2018 at which point in time she was doing very well.  She continued to do well until the end of February, approximately 10 to 12 days ago, at which point time she developed coughing and nasal congestion and nose blowing and just felt terrible.  She went to the urgent care center where she was flu swab negative.  Several days later she spiked a fever of 103.3 and continued to feel just awful along with lots of respiratory tract symptoms including coughing and wheezing.  She has had a total of 2 chest x-rays which may have identified a "pneumonia" of very small size.  She has been treated with multiple injections of antibiotics and systemic steroids by the urgent care center and the emergency room and her primary care doctor.  Currently she has cough and she has some wheezing but she has completely resolved all of her fever.  She may have a little bit of nasal discharge that is white without any ugly nasal discharge or anosmia or throat symptoms.  She still must use her bronchodilator and Tessalon Perles.  Her reflux has been under pretty good control.  She did obtain the flu vaccine this year.  Allergies as of 11/25/2018      Reactions   Latex Rash, Anaphylaxis   Avelox [moxifloxacin Hcl In Nacl] Itching, Rash      Medication List      albuterol 108 (90 Base) MCG/ACT inhaler Commonly known as:  ProAir HFA Inhale two puffs every four to six hours as needed for cough or wheeze.     amphetamine-dextroamphetamine 30 MG 24 hr capsule Commonly known as:  ADDERALL XR Take 30 mg by mouth daily.   beclomethasone 80 MCG/ACT inhaler Commonly known as:  Qvar RediHaler Inhale two doses twice daily as directed.  May increase to 3 inhalations three times a day during asthma flare.   benzonatate 200 MG capsule Commonly known as:  TESSALON TAKE 1 CAPSULE BY MOUTH 3 TIMES A DAY AS NEEDED FOR COUGH FOR UP TO 10 DAYS.   desvenlafaxine 100 MG 24 hr tablet Commonly known as:  PRISTIQ   diltiazem 120 MG 24 hr capsule Commonly known as:  CARDIZEM CD TAKE ONE CAPSULE BY MOUTH AT BEDTIME   fluticasone 50 MCG/ACT nasal spray Commonly known as:  FLONASE Use one to two sprays in each nostril once daily.   guaiFENesin-codeine 100-10 MG/5ML syrup TAKE 10MLS BY MOUTH 4 TIMES A DAY AS NEEDED FOR COUGH   hydrochlorothiazide 12.5 MG capsule Commonly known as:  MICROZIDE Take 12.5 mg by mouth every morning.   ipratropium-albuterol 0.5-2.5 (3) MG/3ML Soln Commonly known as:  DUONEB Take 3 mLs by nebulization every 4 (four) hours as needed.   multivitamin with minerals Tabs tablet Take 1 tablet by mouth every evening.   pantoprazole 40 MG tablet Commonly known as:  PROTONIX Take one tablet by mouth once daily.   pramipexole 1 MG tablet Commonly known as:  MIRAPEX Take 1 mg by mouth at  bedtime.   rosuvastatin 10 MG tablet Commonly known as:  CRESTOR       Past Medical History:  Diagnosis Date  . Asthma   . Restless leg syndrome   . Tachycardia     Past Surgical History:  Procedure Laterality Date  . ABDOMINAL HYSTERECTOMY    . TONSILLECTOMY      Review of systems negative except as noted in HPI / PMHx or noted below:  Review of Systems  Constitutional: Negative.   HENT: Negative.   Eyes: Negative.   Respiratory: Negative.   Cardiovascular: Negative.   Gastrointestinal: Negative.   Genitourinary: Negative.   Musculoskeletal: Negative.   Skin: Negative.    Neurological: Negative.   Endo/Heme/Allergies: Negative.   Psychiatric/Behavioral: Negative.      Objective:   Vitals:   11/25/18 1637  BP: 134/84  Pulse: 92  Resp: 16          Physical Exam Constitutional:      Appearance: She is not diaphoretic.  HENT:     Head: Normocephalic.     Right Ear: Tympanic membrane, ear canal and external ear normal.     Left Ear: Tympanic membrane, ear canal and external ear normal.     Nose: Nose normal. No mucosal edema or rhinorrhea.     Mouth/Throat:     Pharynx: Uvula midline. No oropharyngeal exudate.  Eyes:     Conjunctiva/sclera: Conjunctivae normal.  Neck:     Thyroid: No thyromegaly.     Trachea: Trachea normal. No tracheal tenderness or tracheal deviation.  Cardiovascular:     Rate and Rhythm: Normal rate and regular rhythm.     Heart sounds: Normal heart sounds, S1 normal and S2 normal. No murmur.  Pulmonary:     Effort: No respiratory distress.     Breath sounds: Normal breath sounds. No stridor. No wheezing or rales.  Lymphadenopathy:     Head:     Right side of head: No tonsillar adenopathy.     Left side of head: No tonsillar adenopathy.     Cervical: No cervical adenopathy.  Skin:    Findings: No erythema or rash.     Nails: There is no clubbing.   Neurological:     Mental Status: She is alert.     Diagnostics:    Spirometry was performed and demonstrated an FEV1 of 1.66 at 74 % of predicted.  The patient had an Asthma Control Test with the following results: ACT Total Score: 5.    Assessment and Plan:   1. Post-influenza syndrome   2. Not well controlled moderate persistent asthma   3. Other allergic rhinitis   4. Gastroesophageal reflux disease, esophagitis presence not specified     1.  For this most recent episode utilize the following combination of medications:    A.  Symbicort 160 -2 inhalations twice a day with spacer  B.  Spiriva 1.25 -2 inhalations once a day  C.  Montelukast 10mg  - 1  tablet 1 time per day  D.  Increase protonix to 2 times per day  E.  Can add Mucinex DM 2 times per day if needed  2. Continue Qvar 80 2 inhalations twice a day and Flonase one-2 sprays each nostril once a day  3. If needed:   A.  albuterol MDI or Duoneb nebulization  B.  OTC antihistamine  C.  Tessalon Perles  4. When better, decrease Protonix to daily use, stop Symbicort and Spiriva  5. Further treatment?  6. Return to clinic in 1 year or earlier if problem  Kadeisha appears to have contracted influenza and she probably has a fair amount of epithelial damage from this event and will probably be coughing for somewhere between 8 to 12 weeks total as a result of this infection.  I will give her a collection of agents to use that will hopefully help with her respiratory tract symptoms as noted above and when she resolves this issue entirely she can go back to using just Qvar and Flonase and Protonix only 1 time per day which was working great for a prolonged period in time until her most recent viral infection.  She will keep in contact with me noting her response to this approach.  Further evaluation and treatment will be based upon this response.  [name], MD Allergy / Immunology [place]

## 2018-11-25 NOTE — Patient Instructions (Addendum)
  1.  For this most recent episode utilize the following combination of medications:    A.  Symbicort 160 -2 inhalations twice a day with spacer  B.  Spiriva 1.25 -2 inhalations once a day  C.  Montelukast 10mg  - 1 tablet 1 time per day  D.  Increase protonix to 2 times per day  E.  Can add Mucinex DM 2 times per day if needed  2. Continue Qvar 80 2 inhalations twice a day and Flonase one-2 sprays each nostril once a day  3. If needed:   A.  albuterol MDI or Duoneb nebulization  B.  OTC antihistamine  C.  Tessalon Perles  4. When better, decrease Protonix to daily use, stop Symbicort and Spiriva  5. Further treatment?  6. Return to clinic in 1 year or earlier if problem

## 2018-11-29 ENCOUNTER — Encounter: Payer: Self-pay | Admitting: Allergy and Immunology

## 2018-12-27 ENCOUNTER — Telehealth: Payer: Self-pay

## 2018-12-27 MED ORDER — CETIRIZINE HCL 10 MG PO TABS: 10.0000 mg | ORAL_TABLET | Freq: Every day | ORAL | 5 refills | Status: DC

## 2018-12-27 NOTE — Telephone Encounter (Signed)
Received fax from pharmacy for refill of Cetirizine.  Medications have been sent to pharmacy.

## 2019-01-06 ENCOUNTER — Telehealth: Payer: Self-pay | Admitting: Allergy and Immunology

## 2019-01-06 NOTE — Telephone Encounter (Signed)
Spoke with patient and informed patient of Dr. Bruna Potter suggestions.  I rescheduled the appointment for her yearly for 2021.

## 2019-01-06 NOTE — Telephone Encounter (Signed)
Small independent - [place], [place],   Small group with large organization - [facility], Tarkio Ester, Bea Graff [employer]) [facility]  Larger groups - [place], [facility]

## 2019-01-06 NOTE — Telephone Encounter (Signed)
Claudia Vega called in and would like to know Dr. Bruna Potter opinion on who she should choose as a primary care doctor in [place].  Please advise.

## 2019-01-06 NOTE — Telephone Encounter (Signed)
Left message for patient to call.  In addition to Dr.Kozlow's message, please also ask patient about upcoming appointment next Monday- I think this might be an old appointment that was not removed. She came last month and Dr.Kozlow told her to return in one year. Please double check with patient. Thank you.

## 2019-01-10 ENCOUNTER — Ambulatory Visit: Payer: Managed Care, Other (non HMO) | Admitting: Allergy and Immunology

## 2019-06-05 ENCOUNTER — Other Ambulatory Visit: Payer: Self-pay | Admitting: Allergy and Immunology

## 2019-07-14 ENCOUNTER — Other Ambulatory Visit: Payer: Self-pay | Admitting: Family Medicine

## 2019-07-14 DIAGNOSIS — Z1231 Encounter for screening mammogram for malignant neoplasm of breast: Secondary | ICD-10-CM

## 2019-07-26 ENCOUNTER — Other Ambulatory Visit: Payer: Self-pay

## 2019-07-26 MED ORDER — QVAR REDIHALER 80 MCG/ACT IN AERB: INHALATION_SPRAY | RESPIRATORY_TRACT | 4 refills | Status: DC

## 2019-08-17 ENCOUNTER — Ambulatory Visit: Payer: BC Managed Care – PPO

## 2019-09-10 ENCOUNTER — Other Ambulatory Visit: Payer: Self-pay | Admitting: Allergy and Immunology

## 2019-10-05 ENCOUNTER — Ambulatory Visit: Payer: Managed Care, Other (non HMO) | Admitting: Family Medicine

## 2019-10-05 NOTE — Progress Notes (Deleted)
   [address] [place] [address] Dept: [phone]  FOLLOW UP NOTE  Patient ID: Claudia Vega, female    DOB: 1960-10-02  Age: 59 y.o. MRN: [id number] Date of Office Visit: 10/05/2019  Assessment  Chief Complaint: No chief complaint on file.  HPI Claudia Vega is a 59 year old female who presents to the clinic for an acute evaluation of ***. She was last seen in this clinic on 11/25/2018 by Dr. Neldon Mc for evaluation of asthma, allergic rhinitis, and reflux.    Drug Allergies:  Allergies  Allergen Reactions  . Latex Rash and Anaphylaxis  . Avelox [Moxifloxacin Hcl In Nacl] Itching and Rash    Physical Exam: There were no vitals taken for this visit.   Physical Exam  Diagnostics:    Assessment and Plan: No diagnosis found.  No orders of the defined types were placed in this encounter.   There are no Patient Instructions on file for this visit.  No follow-ups on file.    Thank you for the opportunity to care for this patient.  Please do not hesitate to contact me with questions.  Gareth Morgan, FNP [facility]

## 2019-10-07 ENCOUNTER — Ambulatory Visit: Payer: BC Managed Care – PPO

## 2019-10-12 ENCOUNTER — Other Ambulatory Visit: Payer: Self-pay | Admitting: Allergy and Immunology

## 2019-10-21 DIAGNOSIS — R112 Nausea with vomiting, unspecified: Secondary | ICD-10-CM | POA: Diagnosis not present

## 2019-10-21 DIAGNOSIS — R000 Tachycardia, unspecified: Secondary | ICD-10-CM | POA: Diagnosis not present

## 2019-10-21 DIAGNOSIS — I10 Essential (primary) hypertension: Secondary | ICD-10-CM | POA: Diagnosis not present

## 2019-10-22 DIAGNOSIS — R000 Tachycardia, unspecified: Secondary | ICD-10-CM | POA: Diagnosis not present

## 2019-10-22 DIAGNOSIS — R112 Nausea with vomiting, unspecified: Secondary | ICD-10-CM | POA: Diagnosis not present

## 2019-10-22 DIAGNOSIS — I10 Essential (primary) hypertension: Secondary | ICD-10-CM | POA: Diagnosis not present

## 2019-10-23 DIAGNOSIS — I10 Essential (primary) hypertension: Secondary | ICD-10-CM | POA: Diagnosis not present

## 2019-10-23 DIAGNOSIS — R112 Nausea with vomiting, unspecified: Secondary | ICD-10-CM | POA: Diagnosis not present

## 2019-10-23 DIAGNOSIS — R000 Tachycardia, unspecified: Secondary | ICD-10-CM | POA: Diagnosis not present

## 2019-10-24 DIAGNOSIS — R000 Tachycardia, unspecified: Secondary | ICD-10-CM | POA: Diagnosis not present

## 2019-10-24 DIAGNOSIS — R112 Nausea with vomiting, unspecified: Secondary | ICD-10-CM | POA: Diagnosis not present

## 2019-10-24 DIAGNOSIS — I10 Essential (primary) hypertension: Secondary | ICD-10-CM | POA: Diagnosis not present

## 2019-11-11 ENCOUNTER — Ambulatory Visit
Admission: RE | Admit: 2019-11-11 | Discharge: 2019-11-11 | Disposition: A | Discharge status: Home / Self Care | Payer: BC Managed Care – PPO | Source: Ambulatory Visit | Attending: Family Medicine | Admitting: Family Medicine

## 2019-11-11 ENCOUNTER — Other Ambulatory Visit: Payer: Self-pay

## 2019-11-11 DIAGNOSIS — Z1231 Encounter for screening mammogram for malignant neoplasm of breast: Secondary | ICD-10-CM

## 2019-11-14 ENCOUNTER — Other Ambulatory Visit: Payer: Self-pay | Admitting: Allergy and Immunology

## 2019-11-23 ENCOUNTER — Other Ambulatory Visit: Payer: Self-pay | Admitting: Allergy and Immunology

## 2019-11-26 ENCOUNTER — Other Ambulatory Visit: Payer: Self-pay | Admitting: Allergy and Immunology

## 2019-11-28 ENCOUNTER — Ambulatory Visit: Payer: Managed Care, Other (non HMO) | Admitting: Allergy and Immunology

## 2019-12-01 ENCOUNTER — Other Ambulatory Visit: Payer: Self-pay

## 2019-12-01 ENCOUNTER — Ambulatory Visit (INDEPENDENT_AMBULATORY_CARE_PROVIDER_SITE_OTHER): Payer: BC Managed Care – PPO | Admitting: Neurology

## 2019-12-01 ENCOUNTER — Encounter: Payer: Self-pay | Admitting: Neurology

## 2019-12-01 VITALS — BP 110/70 | HR 67 | Temp 97.8°F | Ht 60.0 in | Wt 202.0 lb

## 2019-12-01 DIAGNOSIS — G4719 Other hypersomnia: Secondary | ICD-10-CM

## 2019-12-01 DIAGNOSIS — G2581 Restless legs syndrome: Secondary | ICD-10-CM

## 2019-12-01 DIAGNOSIS — G4733 Obstructive sleep apnea (adult) (pediatric): Secondary | ICD-10-CM | POA: Insufficient documentation

## 2019-12-01 DIAGNOSIS — G43109 Migraine with aura, not intractable, without status migrainosus: Secondary | ICD-10-CM | POA: Diagnosis not present

## 2019-12-01 DIAGNOSIS — F988 Other specified behavioral and emotional disorders with onset usually occurring in childhood and adolescence: Secondary | ICD-10-CM

## 2019-12-01 MED ORDER — METHYLPREDNISOLONE 4 MG PO TBPK: ORAL_TABLET | ORAL | 0 refills | Status: DC

## 2019-12-01 NOTE — Progress Notes (Signed)
Provided samples of Ubrelvy: 2 boxes Lot: [id number], expiration: 06/2020, NDC: [id number].  Also provided copay card for her to hang onto if medication effective and rx called into her pharmacy.

## 2019-12-01 NOTE — Progress Notes (Signed)
[facility]  PATIENT: Claudia Vega DOB: Sep 17, 1961  REFERRING DOCTOR OR PCP: Chrystie Nose, NP SOURCE: Patient, notes from primary care  _________________________________   HISTORICAL  CHIEF COMPLAINT:  Chief Complaint  Patient presents with  . New Patient (Initial Visit)    RM 12, alone. Paper referral from Chrystie Nose, NP for headache/nausea and vomiting.     HISTORY OF PRESENT ILLNESS:  I had the pleasure seeing your patient, Claudia Vega, at [facility] for neurologic consultation regarding her headaches.  She is a 59 year okd woman who had the onset of headaches 10/10/2019.   She ahd only had rare migraine sin the past preceded by visual aura.    She had a fairly rapid onset of headache associated with difficulty getting words out.   She had nausea and vomiting while at work [employer]) and her husband brought her home.    She was treated for a possible sinus infection and her Covid19 test was negative.   The next week, the headache worsened further.     Besides the HA, nausea/vomiting and word finding difficulty, she also had some abdominal pain, much worse when she eats.    She saw GI while in the hospital and had an EGD, CT scan and abdominal ultrasound.    She was placed on pantoprazole.      She had an outpatient MRI 2 weeks ago and I personally reviewed it.   I personally reviewed the study.  It was normal for age.  There was very minimal atrophy or ischemic changes.     She still has some headache but it is milder and is fluctuating in intensity.   Nothing seems to be associated with the fluctuations.    She still is noting a mental fog and trouble coming up with words at times.      She has had some migraine headaches over the past decade.   She soul get a visual aura followed a few minutes later by moderate headache.   OTC med's often helped.   At one point she was on topiramate but did not feel good so stopped a year or  two after staring.   Migraines remained rare over the last few years.     She has RLS and takes Mirapex with benefit.  She snores at night.  Additionally, the husband has noted that she had some pausing in her breathing she never had a PSG but uses her husband's old CPAP which has resolved her snoring.   She notes she sleeps better with it.    She stopped Pristiq last month but after the headaches were present.     She has tachycardia and is on diltiazem.     EPWORTH SLEEPINESS SCALE  On a scale of 0 - 3 what is the chance of dozing:  Sitting and Reading:   3 Watching TV:    3 Sitting inactive in a public place: 2 Passenger in car for one hour: 3 Lying down to rest in the afternoon: 3 Sitting and talking to someone: 0 Sitting quietly after lunch:  2 In a car, stopped in traffic:  1  Total (out of 24):    17/24 moderate EDS    REVIEW OF SYSTEMS: Constitutional: No fevers, chills, sweats, or change in appetite Eyes: No visual changes, double vision, eye pain Ear, nose and throat: No hearing loss, ear pain, nasal congestion, sore throat Cardiovascular: No chest pain, palpitations Respiratory: No shortness of breath  at rest or with exertion.   She snores and has excessive daytime sleepiness.  Snoring improved with CPAP use (husband's machine) GastrointestinaI: No nausea, vomiting, diarrhea, abdominal pain, fecal incontinence Genitourinary: No dysuria, urinary retention or frequency.  No nocturia. Musculoskeletal: No neck pain, back pain Integumentary: No rash, pruritus, skin lesions Neurological: as above Psychiatric: No depression at this time.  No anxiety Endocrine: No palpitations, diaphoresis, change in appetite, change in weigh or increased thirst Hematologic/Lymphatic: No anemia, purpura, petechiae. Allergic/Immunologic: No itchy/runny eyes, nasal congestion, recent allergic reactions, rashes  ALLERGIES: Allergies  Allergen Reactions  . Latex Rash and Anaphylaxis  .  Other     BEE STINGS  . Avelox [Moxifloxacin Hcl In Nacl] Itching and Rash    HOME MEDICATIONS:  Current Outpatient Medications:  .  albuterol (PROAIR HFA) 108 (90 Base) MCG/ACT inhaler, Inhale two puffs every four to six hours as needed for cough or wheeze., Disp: 1 Inhaler, Rfl: 1 .  beclomethasone (QVAR REDIHALER) 80 MCG/ACT inhaler, PLEASE SEE ATTACHED FOR DETAILED DIRECTIONS, Disp: 10.6 g, Rfl: 4 .  budesonide-formoterol (SYMBICORT) 160-4.5 MCG/ACT inhaler, INHALE TWO PUFFS USING SPACER TWICE DAILY TO PREVENT COUGH OR WHEEZE. RINSE, GARGLE, AND SPIT AFTER USE., Disp: 30.6 Inhaler, Rfl: 1 .  diltiazem (CARDIZEM CD) 120 MG 24 hr capsule, TAKE ONE CAPSULE BY MOUTH AT BEDTIME, Disp: , Rfl:  .  fluticasone (FLONASE) 50 MCG/ACT nasal spray, USE ONE TO TWO SPRAYS IN EACH NOSTRIL ONCE DAILY., Disp: 48 mL, Rfl: 3 .  ipratropium-albuterol (DUONEB) 0.5-2.5 (3) MG/3ML SOLN, Take 3 mLs by nebulization every 4 (four) hours as needed., Disp: 360 mL, Rfl: 0 .  montelukast (SINGULAIR) 10 MG tablet, TAKE 1 TABLET BY MOUTH EVERYDAY AT BEDTIME, Disp: 30 tablet, Rfl: 0 .  pantoprazole (PROTONIX) 40 MG tablet, Take one tablet by mouth once daily.  Increase to one tablet twice daily during flare-up., Disp: 60 tablet, Rfl: 11 .  pramipexole (MIRAPEX) 1 MG tablet, Take 1 mg by mouth at bedtime., Disp: , Rfl:  .  Tiotropium Bromide Monohydrate (SPIRIVA RESPIMAT) 1.25 MCG/ACT AERS, Inhale 2 Doses into the lungs daily. Rinse, gargle, and spit after use., Disp: 1 Inhaler, Rfl: 5 .  methylPREDNISolone (MEDROL DOSEPAK) 4 MG TBPK tablet, 6 pills po x 1 day, then 5 pills po x 1 day, then 4 pills po x 1 day, then 3 pills po x 1 day, then 2 pills po x 1 day, then 1 pill po x 1 day, Disp: 21 tablet, Rfl: 0  PAST MEDICAL HISTORY: Past Medical History:  Diagnosis Date  . Acid reflux   . Anxiety   . Asthma   . Migraine   . Restless leg syndrome   . Skin cancer, basal cell   . Tachycardia     PAST SURGICAL HISTORY:  Past Surgical History:  Procedure Laterality Date  . ABDOMINAL HYSTERECTOMY    . TONSILLECTOMY      FAMILY HISTORY: Family History  Problem Relation Age of Onset  . Hypertension Maternal Grandmother   . Hypertension Mother   . CAD Neg Hx     SOCIAL HISTORY:  Social History   Socioeconomic History  . Marital status: Married    Spouse name: Not on file  . Number of children: 1  . Years of education: Bachelors   . Highest education level: Not on file  Occupational History  . Occupation: [employer]  Tobacco Use  . Smoking status: Never Smoker  . Smokeless tobacco: Never Used  Substance and Sexual Activity  . Alcohol use: Yes    Comment: occasionally  . Drug use: No  . Sexual activity: Not on file  Other Topics Concern  . Not on file  Social History Narrative   Right handed    Caffeine: tea sometimes   Social Determinants of Health   Financial Resource Strain:   . Difficulty of Paying Living Expenses:   Food Insecurity:   . Worried About Charity fundraiser in the Last Year:   . Arboriculturist in the Last Year:   Transportation Needs:   . Film/video editor (Medical):   Marland Kitchen Lack of Transportation (Non-Medical):   Physical Activity:   . Days of Exercise per Week:   . Minutes of Exercise per Session:   Stress:   . Feeling of Stress :   Social Connections:   . Frequency of Communication with Friends and Family:   . Frequency of Social Gatherings with Friends and Family:   . Attends Religious Services:   . Active Member of Clubs or Organizations:   . Attends Archivist Meetings:   Marland Kitchen Marital Status:   Intimate Partner Violence:   . Fear of Current or Ex-Partner:   . Emotionally Abused:   Marland Kitchen Physically Abused:   . Sexually Abused:      PHYSICAL EXAM  Vitals:   12/01/19 1027  BP: 110/70  Pulse: 67  Temp: 97.8 F (36.6 C)  SpO2: 97%  Weight: 202 lb (91.6 kg)  Height: 5' (1.524 m)    Body mass index is 39.45 kg/m.    General: The patient is well-developed and well-nourished and in no acute distress  HEENT:  Head is [place]/AT.  Sclera are anicteric.  Funduscopic exam shows normal optic discs and retinal vessels.  Neck: No carotid bruits are noted.  The neck is nontender.  Cardiovascular: The heart has a regular rate and rhythm with a normal S1 and S2. There were no murmurs, gallops or rubs.    Skin: Extremities are without rash or  edema.  Musculoskeletal:  Back is nontender  Neurologic Exam  Mental status: The patient is alert and oriented x 3 at the time of the examination. The patient has apparent normal recent and remote memory, with an apparently normal attention span and concentration ability.   Speech is normal.  Cranial nerves: Extraocular movements are full. Pupils are equal, round, and reactive to light and accomodation.  Visual fields are full.  Facial symmetry is present. There is good facial sensation to soft touch bilaterally.Facial strength is normal.  Trapezius and sternocleidomastoid strength is normal. No dysarthria is noted.  The tongue is midline, and the patient has symmetric elevation of the soft palate. No obvious hearing deficits are noted.  Motor:  Muscle bulk is normal.   Tone is normal. Strength is  5 / 5 in all 4 extremities.   Sensory: Sensory testing is intact to pinprick, soft touch and vibration sensation in all 4 extremities.  Coordination: Cerebellar testing reveals good finger-nose-finger and heel-to-shin bilaterally.  Gait and station: Station is normal.   Gait is normal. Tandem gait is normal. Romberg is negative.   Reflexes: Deep tendon reflexes are symmetric and normal bilaterally.   Plantar responses are flexor.    DIAGNOSTIC DATA (LABS, IMAGING, TESTING) - I reviewed patient records, labs, notes, testing and imaging myself where available.      ASSESSMENT AND PLAN  Complicated migraine - Plan: Split night study  OSA (obstructive  sleep apnea) -  Plan: CANCELED: PSG SLEEP STUDY  Excessive daytime sleepiness - Plan: Split night study, CANCELED: PSG SLEEP STUDY  Restless leg syndrome  Attention deficit disorder (ADD) in adult   In summary, Claudia Vega is a 59 year old woman who had a headache associated with neurologic symptoms in January.  Headache persisted for several weeks and is still present though much less in intensity.  Her neurologic symptoms resolved.  To try to help resolve the headache I sent in a prescription for steroid pack.  Additionally, some samples of Nurtec were provided for her to try if she has another similar event.    She also complained of mental fog and worsening attention.  She will go back on her Adderall.  She has snoring, excessive daytime sleepiness and the husband has noted some possible apnea.  Therefore we will check a PSG and titrate depending on the results.  She will return to see me in 3 months or sooner for new or worsening neurologic symptoms.  Thank you for asking me to see Claudia Vega for neurologic consult.  Please let me know if I can be of further assistance with her or other patients in the future.    Biran Mayberry A. Felecia Shelling, MD, [facility] [id number], [id number] PM Certified in Neurology, Clinical Neurophysiology, Sleep Medicine and Neuroimaging  [facility] [address], [place], [place] [address] [phone]

## 2019-12-12 ENCOUNTER — Other Ambulatory Visit: Payer: Self-pay | Admitting: Allergy and Immunology

## 2019-12-12 NOTE — Telephone Encounter (Signed)
Courtesy refill  

## 2019-12-26 ENCOUNTER — Other Ambulatory Visit: Payer: Self-pay | Admitting: Allergy and Immunology

## 2019-12-27 ENCOUNTER — Ambulatory Visit (INDEPENDENT_AMBULATORY_CARE_PROVIDER_SITE_OTHER): Payer: BC Managed Care – PPO | Admitting: Neurology

## 2019-12-27 DIAGNOSIS — G4719 Other hypersomnia: Secondary | ICD-10-CM

## 2019-12-27 DIAGNOSIS — G43109 Migraine with aura, not intractable, without status migrainosus: Secondary | ICD-10-CM

## 2019-12-27 DIAGNOSIS — G4733 Obstructive sleep apnea (adult) (pediatric): Secondary | ICD-10-CM

## 2019-12-29 NOTE — Progress Notes (Signed)
PATIENT'S NAME:  Claudia Vega, Claudia Vega DOB:      1961-01-29      MR#:    [id number]     DATE OF RECORDING: 12/27/2019 REFERRING M.D.:  Cyndy Freeze, MD  Study Performed:   Baseline Polysomnogram  HISTORY:   Claudia Vega is a 59 year old woman with headaches.   She has RLS and takes Mirapex with benefit.  She snores at night.  Her husband has noted that she had some pausing in her breathing.  She never had a PSG but uses her husband's old CPAP which has resolved her snoring.   She notes she sleeps better with it.     She stopped Pristiq last month but after the headaches were present.      She has tachycardia and is on diltiazem.     The patient endorsed the Epworth Sleepiness Scale at 17/24 points.    The patient's weight 202 pounds with a height of 60 (inches), resulting in a BMI of 39.8 kg/m2.  CURRENT MEDICATIONS: Proair, QVAR, Symbicort, Cardizem, Flonase, Medrol, Singulair, Protonix, Mirapex, Spiriva.   PROCEDURE:  This is a multichannel digital polysomnogram utilizing the Somnostar 11.2 system.  Electrodes and sensors were applied and monitored per AASM Specifications.   EEG, EOG, Chin and Limb EMG, were sampled at 200 Hz.  ECG, Snore and Nasal Pressure, Thermal Airflow, Respiratory Effort, CPAP Flow and Pressure, Oximetry was sampled at 50 Hz. Digital video and audio were recorded.      BASELINE STUDY  Lights Out was at 21:28 and Lights On at 04:57.  Total recording time (TRT) was 449.5 minutes, with a total sleep time (TST) of 426 minutes.   The patient's sleep latency was 3 minutes.  REM latency was 197.5 minutes.  The sleep efficiency was 94.8 %.     SLEEP ARCHITECTURE: WASO (Wake after sleep onset) was 19.5 minutes.  There were 9.5 minutes in Stage N1, 291.5 minutes Stage N2, 89.5 minutes Stage N3 and 35.5 minutes in Stage REM.  The percentage of Stage N1 was 2.2%, Stage N2 was 68.4%, Stage N3 was 21.% and Stage R (REM sleep) was 8.3%.  The arousals were noted as: 47 were  spontaneous, 5 were associated with PLMs, 39 were associated with respiratory events.   RESPIRATORY ANALYSIS:  There were a total of 115 respiratory events:  4 obstructive apneas, 5 central apneas and 0 mixed apneas with a total of 9 apneas and an apnea index (AI) of 1.3 /hour. There were 106 hypopneas with a hypopnea index of 14.9 /hour. The patient also had 0 respiratory event related arousals (RERAs).      The total APNEA/HYPOPNEA INDEX (AHI) was 16.2/hour and the total RESPIRATORY DISTURBANCE INDEX was  16.2 /hour.  24 events occurred in REM sleep and 169 events in NREM. The REM AHI was  40.6 /hour, versus a non-REM AHI of 14.. The patient spent 128 minutes of total sleep time in the supine position and 298 minutes in non-supine.. The supine AHI was 28.6 versus a non-supine AHI of 10.9.  OXYGEN SATURATION & C02:  The Wake baseline 02 saturation was 94%, with the lowest being 85%. Time spent below 89% saturation equaled 10 minutes.  PERIODIC LIMB MOVEMENTS:   The patient had a total of 5 Periodic Limb Movements.  The Periodic Limb Movement (PLM) index was .7 and the PLM Arousal index was .7/hour  OTHER: EKG showed normal sinus rhythm.  Video did not show any parasomnias.  She has no nocturia    IMPRESSION:  1. Moderate OSA with an AHI = 16.2.   OSA was severe during REM (REM AHI = 40) 2. No significant periodic limb movements    RECOMMENDATIONS: 1.  Advise CPAP.   She could return for an attended titration or be initiated on Auto-PAP with a range 5-20 cm H2O 2. Follow up with Dr. Felecia Shelling   I certify that I have reviewed the entire raw data recording prior to the issuance of this report in accordance with the Standards of Accreditation of the [place] [employer] (AASM)   Sussie Minor A. Felecia Shelling, MD, PhD, FAAN Certified in Neurology, Clinical Neurophysiology, Sleep Medicine and Neuroimaging Director, [facility] [address], [place], [place] [address] [phone]     Demographics and Medical History           Name: Claudia Vega, Claudia Vega. Age: 66 BMI: 39.8 Interp Physician: Arlice Colt, MD  DOB: [birth year] Ht-IN: 60 CM: 152 Referred By: Cyndy Freeze, MD  Pt. Tag:  Wt-LB: 202 KG: 92 Tested By: Fredirick Maudlin, RPSGT  Pt. #: [id number] Sex: Female Scored By: Fredirick Maudlin, RPSGT  Bed Tag: Room #4 Race: Caucasian    Sleep Summary    Sleep Time Statistics Minutes Hours    Time in Bed 449.5    7.5    Total Sleep Time 426    7.1    Total Sleep Time NREM 390.5    6.5    Total Sleep Time REM 35.5    0.6    Sleep Onset 3    0.1    Wake After Sleep Onset 19.5    0.3    Wake After Sleep Period 1    0.0    Latency Persistent Sleep 3    0.1    Sleep Efficiency 94.8 Percent    Lights out 21:28     Lights on 04:57    Sleep Disruption Events Count Index    Arousals 99 13.9    Awakenings 0 0    Arousals + Awakenings 99 13.9    REM Awakenings 0 0.0     Sleep Stage Statistics Wake N1 N2 N3 REM    Percent Stage to SPT 4.4  2.1  65.4  20.1  8.0  Percent   Sleep Period Time in Stage 19.5 9.5 291.5 89.5 35.5 Minutes   Latency to Stage  3 4.5 19.5 197.5 Minutes   Percent Stage to TST  2.2 68.4 21. 8.3 Percent   EKG Summary          EKG Statistics         Heart Rate, Wake 88.5 BPM  TST Epochs in HR Interval 0 < 29   Heart Rate, Steady Sleep Avg 80 BPM   1 30-59   PAC Events 0 Count   413 60-79   PVC Events 0 Count   438 80-99   Bradycardia 0 Count   0 100-119   Tachycardia 0 Count   0 120-139        0 140-159    NREM REM   0 > 160   Shortest R-R .6 .6       Longest R-R 1. 1.        Respiration Summary  Event Statistics Total  With Arousal  With Awakening    Count Index  Count Index  Count Index   Apneas, Total 9  1.3  7 1.0   0 0.0    Hypopneas, Total 106 14.9  32 4.5   0 0.0    Apnea + Hypopnea Index 115 16.2   39 5.5   0 0.0      Apneas, Supine 7 3.3     Apneas, Non Supine 2 .4     Hypopneas, Supine 54 25.3     Hypopneas, Non Supine 52 10.5     % Sleep Apnea .4 Percent     % Sleep Hypopnea 4.1 Percent    Oximetry Statistics       SpO2, Mean Wake 94 Percent     SpO2, Minimum 85 Percent     SpO2, Max 95 Percent     SpO2, Mean 90 Percent            Desaturation Index, REM 42.3  Index     Desaturation Index, NREM 13.7  Index     Desaturation Index, Total 16.1  Index             SpO2 Intervals > 89% 80-89% 70-79% 60-69% 50-59% 40-49% 30-39% < 30%  426 Percent Sleep Time 79.6 20.4 0 0 0 0 0 0  Body Position Statistics   Back Side L Side R Side Prone    Total Sleep Time   128 298.0 20 278 0 Minutes   Percent Time to TST   30.0  70.0  4.7  65.3  0.0  Percent   Number of Events   61 54.0 12 42 0 Count   Number of Apneas   7 2 1 1  0 Count   Number of Hypopneas   54 52 11 41 0 Count   Apnea Index   3.3  0.4  3.0  0.2  0.0  Index   Hypopnea Index   25.3  10.5  33.0  8.8  0.0  Index   Apnea + Hypopnea Index   28.6  10.9  36.0  9.1  0.0  Index  Respiration Events    Non REM, Pre Rx Statistics Non Supine  Supine    Central Mixed Obstr  Central Mixed Obstr   Apneas 0 0 2  5 0 2 Count  Apneas, Minimum SpO2 0 0 88  88 0 89 Percent     Hypopneas 0 0 28  0 0 54 Count  Hypopneas, Minimum SpO2 0 0 85  0 0 87 Percent     Apnea + Hypopneas Index 0.0 0.0 6.9  2.3 0.0 26.2 Index    REM, Pre Rx Statistics Non Supine  Supine    Central Mixed Obstr  Central Mixed Obstr   Apneas 0 0 0  0 0 0 Count  Apneas, Minimum SpO2 0 0 0  0 0 0 Percent     Hypopneas 0 0 24  0 0 0 Count  Hypopneas, Minimum SpO2 0 0 85  0 0 0 Percent     Apnea + Hypopnea Index 0.0 0.0 40.6  0.0 0.0 0.0 Index  Leg Movement Summary    PLM Non REM (Incl. Wake) REM Total    No Arousal Arousal Wake No Arousal Arousal Wake No Arousal Arousal Wake Total   Isolated 7 7 0 5 1 0 12 8 0 20    PLMS 0 5 0 0 0 0 0 5 0 5    Total 7 12 0 5  1 0 12 13 0 25  PLM Statistics PLMS Total     Count Index Count  Index    PLM 5 .7 25 3.5     PLM with Arousal 5 .7 13 1.8     PLM, with Wake 0 0 0 0.0     PLM, Arousal + Wake 5 0.7 13 1.8     PLM, No Arousal 0 0.0  12 1.7     PLM, Non REM 5 0.8  19 2.9     PLM, REM 0 0.0  6 10.1     Technician Comments:  Patient came in for a Baseline PSG sleep study. Patient slept on back and sides. Sleep was observed in all stages. EKG NSR. No nocturia.

## 2020-01-02 ENCOUNTER — Telehealth: Payer: Self-pay | Admitting: *Deleted

## 2020-01-02 DIAGNOSIS — G4733 Obstructive sleep apnea (adult) (pediatric): Secondary | ICD-10-CM

## 2020-01-02 NOTE — Telephone Encounter (Signed)
Called pt, son answered who is not on DPR. He will have her call office back.

## 2020-01-02 NOTE — Telephone Encounter (Signed)
-----   Message from Britt Bottom, MD sent at 12/29/2019  7:02 PM EDT ----- Regarding: PSG Please let him know that the PSG showed moderate sleep apnea.  Therefore, she should do CPAP.  She has been using her husband's old CPAP machine.  We can order AutoPap with a range of 5 to 20 cm.

## 2020-01-03 NOTE — Telephone Encounter (Signed)
Called pt at [phone]. Relayed results per Dr. Felecia Shelling note. She verbalized understanding.  I faxed over order to [facility] at [phone]. Received fax confirmation. Aware they will run via insurance to make sure they are in network.

## 2020-01-05 ENCOUNTER — Encounter: Payer: Self-pay | Admitting: Allergy and Immunology

## 2020-01-05 ENCOUNTER — Ambulatory Visit (INDEPENDENT_AMBULATORY_CARE_PROVIDER_SITE_OTHER): Payer: BC Managed Care – PPO | Admitting: Allergy and Immunology

## 2020-01-05 ENCOUNTER — Other Ambulatory Visit: Payer: Self-pay

## 2020-01-05 VITALS — BP 112/78 | HR 69 | Resp 16

## 2020-01-05 DIAGNOSIS — K219 Gastro-esophageal reflux disease without esophagitis: Secondary | ICD-10-CM

## 2020-01-05 DIAGNOSIS — J3089 Other allergic rhinitis: Secondary | ICD-10-CM | POA: Diagnosis not present

## 2020-01-05 DIAGNOSIS — J4540 Moderate persistent asthma, uncomplicated: Secondary | ICD-10-CM

## 2020-01-05 MED ORDER — EPINEPHRINE 0.3 MG/0.3ML IJ SOAJ: 0.3000 mg | INTRAMUSCULAR | 3 refills | Status: AC | PRN

## 2020-01-05 NOTE — Telephone Encounter (Signed)
Pt has called in response to [facility], RN's message.  Pt is asking for a call to confirm if Terrence Dupont was speaking of [place] states these 3 are covered under her insurance: Otilio Carpen and [facility] (in [place]) pt said if she will have to go to the office [facility] is closer to her.  Please call(pt aware office not open on Fridays and that she may not be called until Monday)

## 2020-01-05 NOTE — Progress Notes (Signed)
[place]   Follow-up Note  Referring Provider: Esperanza Richters, NP Primary Provider: Greig Right, MD Date of Office Visit: 01/05/2020  Subjective:   Claudia Vega (DOB: Feb 18, 1961) is a 59 y.o. female who returns to the [facility] [place] on 01/05/2020 in re-evaluation of the following:  HPI: Claudia Vega returns to this clinic in reevaluation of asthma and allergic rhinitis and reflux.  I last saw her in this clinic on 25 November 2018.  During her last visit she had a flulike illness that fortunately resolved with therapy and she has been doing relatively well since this point in time without the need for any additional systemic steroid or antibiotic for any airway issue and rare use of a short acting bronchodilator while she continues to use her anti-inflammatory medications for her airway on a consistent basis.  Likewise, she has had very little issue with her nose.  Her reflux is under excellent control while using a proton pump inhibitor twice a day.  She has apparently had the development of very significant migraine requiring a hospitalization in January 2021 is under the care of a neurologist and recently had a sleep study performed.  Allergies as of 01/05/2020      Reactions   Latex Rash, Anaphylaxis   Other    BEE STINGS   Avelox [moxifloxacin Hcl In Nacl] Itching, Rash      Medication List    albuterol 108 (90 Base) MCG/ACT inhaler Commonly known as: ProAir HFA Inhale two puffs every four to six hours as needed for cough or wheeze.   budesonide-formoterol 160-4.5 MCG/ACT inhaler Commonly known as: Symbicort INHALE TWO PUFFS USING SPACER TWICE DAILY TO PREVENT COUGH OR WHEEZE. RINSE, GARGLE, AND SPIT AFTER USE.   buPROPion 150 MG 24 hr tablet Commonly known as: WELLBUTRIN XL Take 150 mg by mouth at bedtime.   diltiazem 120 MG 24 hr capsule Commonly known as: CARDIZEM CD TAKE ONE CAPSULE BY MOUTH AT  BEDTIME   fluticasone 50 MCG/ACT nasal spray Commonly known as: FLONASE USE ONE TO TWO SPRAYS IN EACH NOSTRIL ONCE DAILY.   hydrochlorothiazide 12.5 MG capsule Commonly known as: MICROZIDE TAKE 1 CAPSULE BY MOUTH EVERY DAY   ipratropium-albuterol 0.5-2.5 (3) MG/3ML Soln Commonly known as: DUONEB Take 3 mLs by nebulization every 4 (four) hours as needed.   montelukast 10 MG tablet Commonly known as: SINGULAIR TAKE 1 TABLET BY MOUTH EVERYDAY AT BEDTIME   pantoprazole 40 MG tablet Commonly known as: PROTONIX Take one tablet by mouth once daily.  Increase to one tablet twice daily during flare-up.   pramipexole 1 MG tablet Commonly known as: MIRAPEX Take 1 mg by mouth at bedtime.   Qvar RediHaler 80 MCG/ACT inhaler Generic drug: beclomethasone PLEASE SEE ATTACHED FOR DETAILED DIRECTIONS       Past Medical History:  Diagnosis Date  . Acid reflux   . Anxiety   . Asthma   . Migraine   . Restless leg syndrome   . Skin cancer, basal cell   . Tachycardia     Past Surgical History:  Procedure Laterality Date  . ABDOMINAL HYSTERECTOMY    . TONSILLECTOMY      Review of systems negative except as noted in HPI / PMHx or noted below:  Review of Systems  Constitutional: Negative.   HENT: Negative.   Eyes: Negative.   Respiratory: Negative.   Cardiovascular: Negative.   Gastrointestinal: Negative.   Genitourinary: Negative.  Musculoskeletal: Negative.   Skin: Negative.   Neurological: Negative.   Endo/Heme/Allergies: Negative.   Psychiatric/Behavioral: Negative.      Objective:   Vitals:   01/05/20 1132  BP: 112/78  Pulse: 69  Resp: 16  SpO2: 97%          Physical Exam Constitutional:      Appearance: She is not diaphoretic.  HENT:     Head: Normocephalic.     Right Ear: Tympanic membrane, ear canal and external ear normal.     Left Ear: Tympanic membrane, ear canal and external ear normal.     Nose: Nose normal. No mucosal edema or rhinorrhea.      Mouth/Throat:     Pharynx: Uvula midline. No oropharyngeal exudate.  Eyes:     Conjunctiva/sclera: Conjunctivae normal.  Neck:     Thyroid: No thyromegaly.     Trachea: Trachea normal. No tracheal tenderness or tracheal deviation.  Cardiovascular:     Rate and Rhythm: Normal rate and regular rhythm.     Heart sounds: Normal heart sounds, S1 normal and S2 normal. No murmur.  Pulmonary:     Effort: No respiratory distress.     Breath sounds: Normal breath sounds. No stridor. No wheezing or rales.  Lymphadenopathy:     Head:     Right side of head: No tonsillar adenopathy.     Left side of head: No tonsillar adenopathy.     Cervical: No cervical adenopathy.  Skin:    Findings: No erythema or rash.     Nails: There is no clubbing.  Neurological:     Mental Status: She is alert.     Diagnostics:    Spirometry was performed and demonstrated an FEV1 of 1.98 at 91 % of predicted.  Assessment and Plan:   1. Asthma, moderate persistent, well-controlled   2. Other allergic rhinitis   3. Gastroesophageal reflux disease, unspecified whether esophagitis present     1.  Continuing to treat and prevent inflammation:   A.  Symbicort 160 -2 inhalations twice a day with spacer  B.  Montelukast 10mg  - 1 tablet 1 time per day  C.  Flonase - 1-2 sprays each nostril 1 time per day  2.  Continue to treat reflux:   A.  Protonix 40 mg 2 times per day  3.  Continue Qvar 80 Redihaler during increased asthma activity. Can use 2 inhalations 1-2 times per day depending on asthma activity  4. If needed:   A.  albuterol MDI or Duoneb nebulization  B.  OTC antihistamine  5. Return to clinic in 6 months or earlier if problem  6. Obtain Covid vaccine  Claudia Vega appears to be doing very well and her anti-inflammatory medications for airway and therapy against reflux are controlling all of her respiratory and GI issues at this point in time.  Assuming she continues to do well with this plan I  will see her back in his clinic in 6 months or earlier if there is a problem.  Claudia Katz, MD Allergy / Immunology [place]

## 2020-01-05 NOTE — Patient Instructions (Addendum)
  1.  Continuing to treat and prevent inflammation:   A.  Symbicort 160 -2 inhalations twice a day with spacer  B.  Montelukast 10mg  - 1 tablet 1 time per day  C.  Flonase - 1-2 sprays each nostril 1 time per day  2.  Continue to treat reflux:   A.  Protonix 40 mg 2 times per day  3.  Continue Qvar 80 Redihaler during increased asthma activity. Can use 2 inhalations 1-2 times per day depending on asthma activity  4. If needed:   A.  albuterol MDI or Duoneb nebulization  B.  OTC antihistamine  5. Return to clinic in 6 months or earlier if problem  6. Obtain Covid vaccine

## 2020-01-05 NOTE — Telephone Encounter (Addendum)
Called pt back. She spoke with [place] who is doing a Pharmacist, community.  She would like [place] to forward to [facility] as well. Phone: [phone] Fax: [phone]. Advised I will do this on Monday for her. She verbalized understanding.

## 2020-01-06 NOTE — Telephone Encounter (Signed)
Re-faxed referral to [facility] at [phone]. Received fax confirmation.

## 2020-01-09 ENCOUNTER — Encounter: Payer: Self-pay | Admitting: Allergy and Immunology

## 2020-01-09 ENCOUNTER — Other Ambulatory Visit: Payer: Self-pay | Admitting: Allergy and Immunology

## 2020-02-01 ENCOUNTER — Other Ambulatory Visit: Payer: Self-pay | Admitting: Allergy and Immunology

## 2020-03-06 ENCOUNTER — Ambulatory Visit: Payer: Self-pay | Admitting: Neurology

## 2020-05-02 ENCOUNTER — Encounter: Payer: Self-pay | Admitting: Neurology

## 2020-05-02 ENCOUNTER — Ambulatory Visit: Payer: Self-pay | Admitting: Neurology

## 2020-05-17 ENCOUNTER — Ambulatory Visit: Payer: Managed Care, Other (non HMO) | Admitting: Allergy and Immunology

## 2020-05-21 ENCOUNTER — Other Ambulatory Visit: Payer: Self-pay

## 2020-05-21 MED ORDER — QVAR REDIHALER 80 MCG/ACT IN AERB: INHALATION_SPRAY | RESPIRATORY_TRACT | 0 refills | Status: AC

## 2020-05-21 MED ORDER — MONTELUKAST SODIUM 10 MG PO TABS: ORAL_TABLET | ORAL | 0 refills | Status: AC

## 2020-05-24 ENCOUNTER — Encounter: Payer: Self-pay | Admitting: Allergy and Immunology

## 2020-05-24 ENCOUNTER — Other Ambulatory Visit: Payer: Self-pay

## 2020-05-24 ENCOUNTER — Ambulatory Visit (INDEPENDENT_AMBULATORY_CARE_PROVIDER_SITE_OTHER): Payer: Managed Care, Other (non HMO) | Admitting: Allergy and Immunology

## 2020-05-24 VITALS — BP 92/52 | HR 96 | Temp 98.4°F | Resp 14 | Ht 59.9 in | Wt 196.8 lb

## 2020-05-24 DIAGNOSIS — K219 Gastro-esophageal reflux disease without esophagitis: Secondary | ICD-10-CM | POA: Diagnosis not present

## 2020-05-24 DIAGNOSIS — J3089 Other allergic rhinitis: Secondary | ICD-10-CM

## 2020-05-24 DIAGNOSIS — J4540 Moderate persistent asthma, uncomplicated: Secondary | ICD-10-CM | POA: Diagnosis not present

## 2020-05-24 NOTE — Patient Instructions (Signed)
  1.  Continuing to treat and prevent inflammation:   A.  Symbicort 160 -2 inhalations twice a day with spacer  B.  Montelukast 10mg  - 1 tablet 1 time per day  C.  Flonase - 1-2 sprays each nostril 1 time per day  2.  Continue to treat reflux:   A.  Protonix 40 mg 2 times per day  3.  Continue Qvar 80 Redihaler 2 inhalations 2 times per day during flare up  4. If needed:   A.  albuterol MDI or Duoneb nebulization  B.  OTC Loratadine 10 mg - 1 tablet 2 times per day  C.  OTC Mucinex DM - 2 times per day  5. Return to clinic in 6 months or earlier if problem  6. Obtain Covid vaccine and fall flu vaccine

## 2020-05-24 NOTE — Progress Notes (Signed)
[place]   Follow-up Note  Referring Provider: Greig Right, MD Primary Provider: Greig Right, MD Date of Office Visit: 05/24/2020  Subjective:   Claudia Vega (DOB: 1961/07/12) is a 59 y.o. female who returns to the [facility] [place] on 05/24/2020 in re-evaluation of the following:  HPI: Lynn[place] returns to this clinic in reevaluation of asthma and allergic rhinitis and reflux.  I last saw her in this clinic on 04 January 2020 at which point time she was doing quite well.  She continued to do well without any significant issues affecting either respiratory track or her reflux on a collection of agents designed to prevent her from developing inflammation of her airway and reflux induced respiratory disease.  Unfortunately, about 2-1/2 weeks ago she started with coughing spells and nasal congestion for which she went to the urgent care center and was treated with cephalexin and dexamethasone and activated her "action plan" in the context of being Covid swab negative.  She subsequently required a E-visit with her insurance company and was treated with amoxicillin and prednisone which she finished yesterday.  Yesterday she felt really bad and she was freezing and she had a temperature of 101.9 for which she took some Advil.  Today she has a normal temperature and she actually feels much better and her head congestion has minimized and she is not coughing.  She still has a little bit of chest tightness.  There is no chest pain or ugly sputum production or ugly nasal discharge or anosmia.  Allergies as of 05/24/2020      Reactions   Latex Rash, Anaphylaxis   Other    BEE STINGS   Avelox [moxifloxacin Hcl In Nacl] Itching, Rash      Medication List      albuterol 108 (90 Base) MCG/ACT inhaler Commonly known as: ProAir HFA Inhale two puffs every four to six hours as needed for cough or wheeze.   budesonide-formoterol  160-4.5 MCG/ACT inhaler Commonly known as: Symbicort INHALE TWO PUFFS USING SPACER TWICE DAILY TO PREVENT COUGH OR WHEEZE. RINSE, GARGLE, AND SPIT AFTER USE.   buPROPion 150 MG 24 hr tablet Commonly known as: WELLBUTRIN XL Take 150 mg by mouth at bedtime.   diltiazem 120 MG 24 hr capsule Commonly known as: CARDIZEM CD TAKE ONE CAPSULE BY MOUTH AT BEDTIME   EPINEPHrine 0.3 mg/0.3 mL Soaj injection Commonly known as: EPI-PEN Inject 0.3 mLs (0.3 mg total) into the muscle as needed for anaphylaxis.   fluticasone 50 MCG/ACT nasal spray Commonly known as: FLONASE USE ONE TO TWO SPRAYS IN EACH NOSTRIL ONCE DAILY.   hydrochlorothiazide 12.5 MG capsule Commonly known as: MICROZIDE TAKE 1 CAPSULE BY MOUTH EVERY DAY   ipratropium-albuterol 0.5-2.5 (3) MG/3ML Soln Commonly known as: DUONEB Take 3 mLs by nebulization every 4 (four) hours as needed.   montelukast 10 MG tablet Commonly known as: SINGULAIR TAKE 1 TABLET BY MOUTH EVERYDAY AT BEDTIME   pantoprazole 40 MG tablet Commonly known as: PROTONIX Take one tablet by mouth once daily.  Increase to one tablet twice daily during flare-up.   pramipexole 1 MG tablet Commonly known as: MIRAPEX Take 1 mg by mouth at bedtime.   Qvar RediHaler 80 MCG/ACT inhaler Generic drug: beclomethasone Inhale two puffs one to two times daily during asthma flare-up as directed.  Rinse, gargle, and spit after use.   tretinoin 0.05 % cream Commonly known as: RETIN-A Apply topically at bedtime.  Ubrelvy 100 MG Tabs Generic drug: Ubrogepant Take 1 tablet by mouth once.       Past Medical History:  Diagnosis Date  . Acid reflux   . Anxiety   . Asthma   . Migraine   . Restless leg syndrome   . Skin cancer, basal cell   . Tachycardia     Past Surgical History:  Procedure Laterality Date  . ABDOMINAL HYSTERECTOMY    . TONSILLECTOMY      Review of systems negative except as noted in HPI / PMHx or noted below:  Review of Systems    Constitutional: Negative.   HENT: Negative.   Eyes: Negative.   Respiratory: Negative.   Cardiovascular: Negative.   Gastrointestinal: Negative.   Genitourinary: Negative.   Musculoskeletal: Negative.   Skin: Negative.   Neurological: Negative.   Endo/Heme/Allergies: Negative.   Psychiatric/Behavioral: Negative.      Objective:   Vitals:   05/24/20 1051  BP: (!) 92/52  Pulse: 96  Resp: 14  Temp: 98.4 F (36.9 C)  SpO2: 93%   Height: 4' 11.9" (152.1 cm)  Weight: 196 lb 12.8 oz (89.3 kg)   Physical Exam Constitutional:      Appearance: She is not diaphoretic.  HENT:     Head: Normocephalic.     Right Ear: Tympanic membrane, ear canal and external ear normal.     Left Ear: Tympanic membrane, ear canal and external ear normal.     Nose: Nose normal. No mucosal edema or rhinorrhea.     Mouth/Throat:     Pharynx: Uvula midline. No oropharyngeal exudate.  Eyes:     Conjunctiva/sclera: Conjunctivae normal.  Neck:     Thyroid: No thyromegaly.     Trachea: Trachea normal. No tracheal tenderness or tracheal deviation.  Cardiovascular:     Rate and Rhythm: Normal rate and regular rhythm.     Heart sounds: Normal heart sounds, S1 normal and S2 normal. No murmur heard.   Pulmonary:     Effort: No respiratory distress.     Breath sounds: Normal breath sounds. No stridor. No wheezing or rales.  Lymphadenopathy:     Head:     Right side of head: No tonsillar adenopathy.     Left side of head: No tonsillar adenopathy.     Cervical: No cervical adenopathy.  Skin:    Findings: No erythema or rash.     Nails: There is no clubbing.  Neurological:     Mental Status: She is alert.     Diagnostics:    Spirometry was performed and demonstrated an FEV1 of 1.70 at 78 % of predicted.  Assessment and Plan:   1. Asthma, moderate persistent, well-controlled   2. Other allergic rhinitis   3. Gastroesophageal reflux disease, unspecified whether esophagitis present     1.   Continuing to treat and prevent inflammation:   A.  Symbicort 160 -2 inhalations twice a day with spacer  B.  Montelukast 10mg  - 1 tablet 1 time per day  C.  Flonase - 1-2 sprays each nostril 1 time per day  2.  Continue to treat reflux:   A.  Protonix 40 mg 2 times per day  3.  Continue Qvar 80 Redihaler 2 inhalations 2 times per day during flare up  4. If needed:   A.  albuterol MDI or Duoneb nebulization  B.  OTC Loratadine 10 mg - 1 tablet 2 times per day  C.  OTC Mucinex DM - 2 times  per day  5. Return to clinic in 6 months or earlier if problem  6. Obtain Covid vaccine and fall flu vaccine  [place] actually appears to be doing better today than she has been for the past several days.  We will keep her on a "action plan" with high-dose inhaled steroids at the same time she continues on her preventative plan for airway inflammation and reflux as noted above.  We will hold off on any further evaluation or treatment for what appears to be a viral induced respiratory tract flare unless of course she does not completely resolve this issue or develops new symptoms.  [name], MD Allergy / Immunology [place]

## 2020-05-27 ENCOUNTER — Emergency Department (HOSPITAL_COMMUNITY): Payer: 59

## 2020-05-27 ENCOUNTER — Inpatient Hospital Stay (HOSPITAL_COMMUNITY)
Admission: EM | Admit: 2020-05-27 | Discharge: 2020-07-23 | DRG: 870 | Disposition: E | Payer: 59 | Attending: Internal Medicine | Admitting: Internal Medicine

## 2020-05-27 ENCOUNTER — Encounter (HOSPITAL_COMMUNITY): Payer: Self-pay | Admitting: Emergency Medicine

## 2020-05-27 ENCOUNTER — Other Ambulatory Visit: Payer: Self-pay

## 2020-05-27 DIAGNOSIS — J470 Bronchiectasis with acute lower respiratory infection: Secondary | ICD-10-CM | POA: Diagnosis present

## 2020-05-27 DIAGNOSIS — E1165 Type 2 diabetes mellitus with hyperglycemia: Secondary | ICD-10-CM | POA: Diagnosis present

## 2020-05-27 DIAGNOSIS — G2581 Restless legs syndrome: Secondary | ICD-10-CM | POA: Diagnosis present

## 2020-05-27 DIAGNOSIS — K5900 Constipation, unspecified: Secondary | ICD-10-CM | POA: Diagnosis present

## 2020-05-27 DIAGNOSIS — J80 Acute respiratory distress syndrome: Secondary | ICD-10-CM | POA: Diagnosis not present

## 2020-05-27 DIAGNOSIS — A4189 Other specified sepsis: Secondary | ICD-10-CM | POA: Diagnosis present

## 2020-05-27 DIAGNOSIS — K219 Gastro-esophageal reflux disease without esophagitis: Secondary | ICD-10-CM | POA: Diagnosis present

## 2020-05-27 DIAGNOSIS — J9311 Primary spontaneous pneumothorax: Secondary | ICD-10-CM | POA: Diagnosis not present

## 2020-05-27 DIAGNOSIS — Z90710 Acquired absence of both cervix and uterus: Secondary | ICD-10-CM | POA: Diagnosis not present

## 2020-05-27 DIAGNOSIS — Z79899 Other long term (current) drug therapy: Secondary | ICD-10-CM | POA: Diagnosis not present

## 2020-05-27 DIAGNOSIS — U071 COVID-19: Secondary | ICD-10-CM

## 2020-05-27 DIAGNOSIS — T380X5A Adverse effect of glucocorticoids and synthetic analogues, initial encounter: Secondary | ICD-10-CM | POA: Diagnosis present

## 2020-05-27 DIAGNOSIS — E874 Mixed disorder of acid-base balance: Secondary | ICD-10-CM | POA: Diagnosis present

## 2020-05-27 DIAGNOSIS — Z66 Do not resuscitate: Secondary | ICD-10-CM | POA: Diagnosis not present

## 2020-05-27 DIAGNOSIS — R6521 Severe sepsis with septic shock: Secondary | ICD-10-CM | POA: Diagnosis not present

## 2020-05-27 DIAGNOSIS — R739 Hyperglycemia, unspecified: Secondary | ICD-10-CM | POA: Diagnosis not present

## 2020-05-27 DIAGNOSIS — Z7952 Long term (current) use of systemic steroids: Secondary | ICD-10-CM

## 2020-05-27 DIAGNOSIS — N179 Acute kidney failure, unspecified: Secondary | ICD-10-CM | POA: Diagnosis not present

## 2020-05-27 DIAGNOSIS — R040 Epistaxis: Secondary | ICD-10-CM | POA: Diagnosis not present

## 2020-05-27 DIAGNOSIS — J1282 Pneumonia due to coronavirus disease 2019: Secondary | ICD-10-CM | POA: Diagnosis present

## 2020-05-27 DIAGNOSIS — Z6841 Body Mass Index (BMI) 40.0 and over, adult: Secondary | ICD-10-CM

## 2020-05-27 DIAGNOSIS — J95811 Postprocedural pneumothorax: Secondary | ICD-10-CM | POA: Diagnosis not present

## 2020-05-27 DIAGNOSIS — Z452 Encounter for adjustment and management of vascular access device: Secondary | ICD-10-CM

## 2020-05-27 DIAGNOSIS — L8990 Pressure ulcer of unspecified site, unspecified stage: Secondary | ICD-10-CM | POA: Insufficient documentation

## 2020-05-27 DIAGNOSIS — I472 Ventricular tachycardia: Secondary | ICD-10-CM | POA: Diagnosis not present

## 2020-05-27 DIAGNOSIS — D6489 Other specified anemias: Secondary | ICD-10-CM | POA: Diagnosis not present

## 2020-05-27 DIAGNOSIS — R0902 Hypoxemia: Secondary | ICD-10-CM

## 2020-05-27 DIAGNOSIS — R339 Retention of urine, unspecified: Secondary | ICD-10-CM | POA: Diagnosis not present

## 2020-05-27 DIAGNOSIS — Z515 Encounter for palliative care: Secondary | ICD-10-CM | POA: Diagnosis not present

## 2020-05-27 DIAGNOSIS — Z85828 Personal history of other malignant neoplasm of skin: Secondary | ICD-10-CM

## 2020-05-27 DIAGNOSIS — R7989 Other specified abnormal findings of blood chemistry: Secondary | ICD-10-CM | POA: Diagnosis not present

## 2020-05-27 DIAGNOSIS — I10 Essential (primary) hypertension: Secondary | ICD-10-CM | POA: Diagnosis present

## 2020-05-27 DIAGNOSIS — Z4659 Encounter for fitting and adjustment of other gastrointestinal appliance and device: Secondary | ICD-10-CM

## 2020-05-27 DIAGNOSIS — Z978 Presence of other specified devices: Secondary | ICD-10-CM

## 2020-05-27 DIAGNOSIS — J9601 Acute respiratory failure with hypoxia: Secondary | ICD-10-CM | POA: Diagnosis not present

## 2020-05-27 DIAGNOSIS — J45909 Unspecified asthma, uncomplicated: Secondary | ICD-10-CM | POA: Diagnosis present

## 2020-05-27 DIAGNOSIS — R0602 Shortness of breath: Secondary | ICD-10-CM

## 2020-05-27 DIAGNOSIS — J939 Pneumothorax, unspecified: Secondary | ICD-10-CM

## 2020-05-27 DIAGNOSIS — E871 Hypo-osmolality and hyponatremia: Secondary | ICD-10-CM | POA: Diagnosis not present

## 2020-05-27 DIAGNOSIS — J9690 Respiratory failure, unspecified, unspecified whether with hypoxia or hypercapnia: Secondary | ICD-10-CM

## 2020-05-27 DIAGNOSIS — R34 Anuria and oliguria: Secondary | ICD-10-CM | POA: Diagnosis not present

## 2020-05-27 DIAGNOSIS — Z9689 Presence of other specified functional implants: Secondary | ICD-10-CM | POA: Diagnosis not present

## 2020-05-27 DIAGNOSIS — E875 Hyperkalemia: Secondary | ICD-10-CM | POA: Diagnosis not present

## 2020-05-27 DIAGNOSIS — N170 Acute kidney failure with tubular necrosis: Secondary | ICD-10-CM | POA: Diagnosis present

## 2020-05-27 LAB — CBC WITH DIFFERENTIAL/PLATELET
Abs Immature Granulocytes: 0.05 10*3/uL (ref 0.00–0.07)
Basophils Absolute: 0 10*3/uL (ref 0.0–0.1)
Basophils Relative: 0 %
Eosinophils Absolute: 0 10*3/uL (ref 0.0–0.5)
Eosinophils Relative: 0 %
HCT: 44.1 % (ref 36.0–46.0)
Hemoglobin: 14.3 g/dL (ref 12.0–15.0)
Immature Granulocytes: 1 %
Lymphocytes Relative: 10 %
Lymphs Abs: 0.9 10*3/uL (ref 0.7–4.0)
MCH: 28.9 pg (ref 26.0–34.0)
MCHC: 32.4 g/dL (ref 30.0–36.0)
MCV: 89.3 fL (ref 80.0–100.0)
Monocytes Absolute: 0.4 10*3/uL (ref 0.1–1.0)
Monocytes Relative: 5 %
Neutro Abs: 7.6 10*3/uL (ref 1.7–7.7)
Neutrophils Relative %: 84 %
Platelets: 147 10*3/uL — ABNORMAL LOW (ref 150–400)
RBC: 4.94 MIL/uL (ref 3.87–5.11)
RDW: 13.1 % (ref 11.5–15.5)
WBC: 9 10*3/uL (ref 4.0–10.5)
nRBC: 0 % (ref 0.0–0.2)

## 2020-05-27 LAB — FERRITIN: Ferritin: 547 ng/mL — ABNORMAL HIGH (ref 11–307)

## 2020-05-27 LAB — COMPREHENSIVE METABOLIC PANEL
ALT: 48 U/L — ABNORMAL HIGH (ref 0–44)
AST: 43 U/L — ABNORMAL HIGH (ref 15–41)
Albumin: 3 g/dL — ABNORMAL LOW (ref 3.5–5.0)
Alkaline Phosphatase: 74 U/L (ref 38–126)
Anion gap: 12 (ref 5–15)
BUN: 12 mg/dL (ref 6–20)
CO2: 25 mmol/L (ref 22–32)
Calcium: 8.5 mg/dL — ABNORMAL LOW (ref 8.9–10.3)
Chloride: 98 mmol/L (ref 98–111)
Creatinine, Ser: 1.09 mg/dL — ABNORMAL HIGH (ref 0.44–1.00)
GFR calc Af Amer: >60 mL/min (ref >60)
GFR calc non Af Amer: 55 mL/min — ABNORMAL LOW (ref >60)
Glucose, Bld: 202 mg/dL — ABNORMAL HIGH (ref 70–99)
Potassium: 3.8 mmol/L (ref 3.5–5.1)
Sodium: 135 mmol/L (ref 135–145)
Total Bilirubin: 0.6 mg/dL (ref 0.3–1.2)
Total Protein: 6.5 g/dL (ref 6.5–8.1)

## 2020-05-27 LAB — LACTIC ACID, PLASMA
Lactic Acid, Venous: 2.9 mmol/L (ref 0.5–1.9)
Lactic Acid, Venous: 2.4 mmol/L (ref 0.5–1.9)

## 2020-05-27 LAB — C-REACTIVE PROTEIN: CRP: 10 mg/dL — ABNORMAL HIGH (ref <1.0)

## 2020-05-27 LAB — LACTATE DEHYDROGENASE: LDH: 342 U/L — ABNORMAL HIGH (ref 98–192)

## 2020-05-27 LAB — D-DIMER, QUANTITATIVE: D-Dimer, Quant: 0.76 ug/mL-FEU — ABNORMAL HIGH (ref 0.00–0.50)

## 2020-05-27 LAB — PROCALCITONIN: Procalcitonin: <0.1 ng/mL

## 2020-05-27 LAB — CBG MONITORING, ED: Glucose-Capillary: 255 mg/dL — ABNORMAL HIGH (ref 70–99)

## 2020-05-27 LAB — I-STAT BETA HCG BLOOD, ED (MC, WL, AP ONLY): I-stat hCG, quantitative: <5 m[IU]/mL (ref <5)

## 2020-05-27 LAB — TRIGLYCERIDES: Triglycerides: 223 mg/dL — ABNORMAL HIGH (ref <150)

## 2020-05-27 LAB — FIBRINOGEN: Fibrinogen: 675 mg/dL — ABNORMAL HIGH (ref 210–475)

## 2020-05-27 LAB — SARS CORONAVIRUS 2 BY RT PCR (HOSPITAL ORDER, PERFORMED IN ~~LOC~~ HOSPITAL LAB): SARS Coronavirus 2: POSITIVE — AB

## 2020-05-27 MED ORDER — ALBUTEROL SULFATE HFA 108 (90 BASE) MCG/ACT IN AERS
2.0000 | INHALATION_SPRAY | Freq: Once | RESPIRATORY_TRACT | Status: AC
  Administered 2020-05-27: 2 via RESPIRATORY_TRACT
  Filled 2020-05-27: qty 6.7

## 2020-05-27 MED ORDER — IPRATROPIUM-ALBUTEROL 20-100 MCG/ACT IN AERS
1.0000 | INHALATION_SPRAY | Freq: Four times a day (QID) | RESPIRATORY_TRACT | Status: DC
  Administered 2020-05-28 – 2020-06-10 (×47): 1 via RESPIRATORY_TRACT
  Filled 2020-05-27: qty 4

## 2020-05-27 MED ORDER — GUAIFENESIN-DM 100-10 MG/5ML PO SYRP
10.0000 mL | ORAL_SOLUTION | ORAL | Status: DC | PRN
  Administered 2020-05-28 – 2020-05-29 (×3): 10 mL via ORAL
  Filled 2020-05-27 (×3): qty 10

## 2020-05-27 MED ORDER — MONTELUKAST SODIUM 10 MG PO TABS
10.0000 mg | ORAL_TABLET | Freq: Every day | ORAL | Status: DC
  Administered 2020-05-27 – 2020-06-14 (×19): 10 mg via ORAL
  Filled 2020-05-27 (×19): qty 1

## 2020-05-27 MED ORDER — INSULIN ASPART 100 UNIT/ML ~~LOC~~ SOLN
0.0000 [IU] | Freq: Three times a day (TID) | SUBCUTANEOUS | Status: DC
  Administered 2020-05-28: 11 [IU] via SUBCUTANEOUS
  Administered 2020-05-28: 8 [IU] via SUBCUTANEOUS
  Administered 2020-05-28: 5 [IU] via SUBCUTANEOUS
  Administered 2020-05-29: 3 [IU] via SUBCUTANEOUS
  Administered 2020-05-29: 8 [IU] via SUBCUTANEOUS
  Administered 2020-05-29: 5 [IU] via SUBCUTANEOUS
  Administered 2020-05-30 (×3): 3 [IU] via SUBCUTANEOUS
  Administered 2020-05-31 – 2020-06-01 (×5): 5 [IU] via SUBCUTANEOUS
  Administered 2020-06-01: 3 [IU] via SUBCUTANEOUS
  Administered 2020-06-02 (×3): 5 [IU] via SUBCUTANEOUS
  Administered 2020-06-03 – 2020-06-04 (×3): 3 [IU] via SUBCUTANEOUS
  Administered 2020-06-04: 8 [IU] via SUBCUTANEOUS
  Administered 2020-06-05: 5 [IU] via SUBCUTANEOUS
  Administered 2020-06-05: 3 [IU] via SUBCUTANEOUS
  Administered 2020-06-05: 2 [IU] via SUBCUTANEOUS
  Administered 2020-06-06: 3 [IU] via SUBCUTANEOUS
  Administered 2020-06-06: 5 [IU] via SUBCUTANEOUS
  Administered 2020-06-06: 2 [IU] via SUBCUTANEOUS
  Administered 2020-06-07: 5 [IU] via SUBCUTANEOUS
  Administered 2020-06-07: 2 [IU] via SUBCUTANEOUS
  Administered 2020-06-08 (×2): 3 [IU] via SUBCUTANEOUS
  Administered 2020-06-09: 5 [IU] via SUBCUTANEOUS
  Administered 2020-06-10 – 2020-06-11 (×2): 2 [IU] via SUBCUTANEOUS
  Administered 2020-06-11: 8 [IU] via SUBCUTANEOUS
  Administered 2020-06-11: 5 [IU] via SUBCUTANEOUS
  Administered 2020-06-12 – 2020-06-13 (×4): 3 [IU] via SUBCUTANEOUS
  Administered 2020-06-13: 2 [IU] via SUBCUTANEOUS
  Administered 2020-06-13: 3 [IU] via SUBCUTANEOUS
  Administered 2020-06-14: 5 [IU] via SUBCUTANEOUS
  Administered 2020-06-14: 2 [IU] via SUBCUTANEOUS
  Administered 2020-06-15: 5 [IU] via SUBCUTANEOUS
  Administered 2020-06-15: 2 [IU] via SUBCUTANEOUS

## 2020-05-27 MED ORDER — ONDANSETRON HCL 4 MG/2ML IJ SOLN
4.0000 mg | Freq: Four times a day (QID) | INTRAMUSCULAR | Status: DC | PRN
  Administered 2020-05-29 – 2020-06-14 (×4): 4 mg via INTRAVENOUS
  Filled 2020-05-27 (×4): qty 2

## 2020-05-27 MED ORDER — ACETAMINOPHEN 325 MG PO TABS
650.0000 mg | ORAL_TABLET | Freq: Four times a day (QID) | ORAL | Status: DC | PRN
  Administered 2020-05-30 – 2020-06-12 (×6): 650 mg via ORAL
  Filled 2020-05-27 (×7): qty 2

## 2020-05-27 MED ORDER — SODIUM CHLORIDE 0.9 % IV SOLN
100.0000 mg | INTRAVENOUS | Status: AC
  Administered 2020-05-28 – 2020-05-31 (×4): 100 mg via INTRAVENOUS
  Filled 2020-05-27: qty 20
  Filled 2020-05-27: qty 100
  Filled 2020-05-27 (×3): qty 20

## 2020-05-27 MED ORDER — SODIUM CHLORIDE 0.9 % IV SOLN: 400.0000 mg | PREFILLED_SYRINGE | Freq: Once | SUBCUTANEOUS | Status: DC

## 2020-05-27 MED ORDER — BARICITINIB 2 MG PO TABS
4.0000 mg | ORAL_TABLET | Freq: Every day | ORAL | Status: DC
  Filled 2020-05-27: qty 2

## 2020-05-27 MED ORDER — MOMETASONE FURO-FORMOTEROL FUM 200-5 MCG/ACT IN AERO
2.0000 | INHALATION_SPRAY | Freq: Two times a day (BID) | RESPIRATORY_TRACT | Status: DC
  Administered 2020-05-28 – 2020-06-15 (×35): 2 via RESPIRATORY_TRACT
  Filled 2020-05-27: qty 8.8

## 2020-05-27 MED ORDER — BARICITINIB 2 MG PO TABS
2.0000 mg | ORAL_TABLET | Freq: Every day | ORAL | Status: DC
  Administered 2020-05-28 – 2020-05-29 (×2): 2 mg via ORAL
  Filled 2020-05-27 (×3): qty 1

## 2020-05-27 MED ORDER — ONDANSETRON HCL 4 MG PO TABS
4.0000 mg | ORAL_TABLET | Freq: Four times a day (QID) | ORAL | Status: DC | PRN
  Administered 2020-06-01: 4 mg via ORAL
  Filled 2020-05-27: qty 1

## 2020-05-27 MED ORDER — ALBUTEROL SULFATE HFA 108 (90 BASE) MCG/ACT IN AERS
2.0000 | INHALATION_SPRAY | RESPIRATORY_TRACT | Status: DC | PRN
  Administered 2020-05-28 – 2020-06-10 (×14): 2 via RESPIRATORY_TRACT
  Filled 2020-05-27: qty 6.7

## 2020-05-27 MED ORDER — DEXAMETHASONE SODIUM PHOSPHATE 10 MG/ML IJ SOLN
10.0000 mg | Freq: Once | INTRAMUSCULAR | Status: AC
  Administered 2020-05-27: 10 mg via INTRAVENOUS
  Filled 2020-05-27: qty 1

## 2020-05-27 MED ORDER — HYDROCOD POLST-CPM POLST ER 10-8 MG/5ML PO SUER
5.0000 mL | Freq: Two times a day (BID) | ORAL | Status: DC | PRN
  Administered 2020-05-27 – 2020-05-28 (×2): 5 mL via ORAL
  Filled 2020-05-27 (×2): qty 5

## 2020-05-27 MED ORDER — INSULIN ASPART 100 UNIT/ML ~~LOC~~ SOLN
0.0000 [IU] | Freq: Every day | SUBCUTANEOUS | Status: DC
  Administered 2020-05-27: 3 [IU] via SUBCUTANEOUS
  Administered 2020-06-01: 2 [IU] via SUBCUTANEOUS
  Administered 2020-06-03: 4 [IU] via SUBCUTANEOUS
  Administered 2020-06-10: 2 [IU] via SUBCUTANEOUS
  Administered 2020-06-15: 4 [IU] via SUBCUTANEOUS

## 2020-05-27 MED ORDER — SODIUM CHLORIDE 0.9 % IV SOLN
200.0000 mg | Freq: Once | INTRAVENOUS | Status: AC
  Administered 2020-05-27: 200 mg via INTRAVENOUS
  Filled 2020-05-27: qty 40

## 2020-05-27 MED ORDER — ENOXAPARIN SODIUM 40 MG/0.4ML ~~LOC~~ SOLN
40.0000 mg | SUBCUTANEOUS | Status: DC
  Administered 2020-05-27 – 2020-05-29 (×3): 40 mg via SUBCUTANEOUS
  Filled 2020-05-27 (×3): qty 0.4

## 2020-05-27 MED ORDER — DEXAMETHASONE SODIUM PHOSPHATE 10 MG/ML IJ SOLN: 6.0000 mg | INTRAMUSCULAR | Status: DC

## 2020-05-27 NOTE — ED Provider Notes (Addendum)
[facility] EMERGENCY DEPARTMENT Provider Note   CSN: [id number] Arrival date & time: 06/13/2020  1924     History Chief Complaint  Patient presents with  . Shortness of Breath  . Cough    Claudia Vega is a 59 y.o. female with pertinent past medical history of asthma, anxiety, hypertension that presents emergency department today for shortness of breath. Patient was diagnosed by PCP with COVID yesterday. Patient has not been vaccinated. 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 States that she has been having symptoms for 2 weeks, was diagnosed with bronchitis 2 weeks ago, was tested negative for Covid then. States that she started feeling worse on Thursday with worsening shortness of breath. Not complaining of any pain anywhere. No chest pain. Denies any myalgias, headache, neck pain, numbness, tingling, hemoptysis, sore throat, congestion, vision changes, abdominal pain, nausea, vomiting, diarrhea. Patient states that she has been more fatigued than normal. Denies any fevers, chills. Denies tobacco use. Denies any coagulation disorder, recent surgery, recent long travel, previous DVT. States that she was generally healthy before this.   HPI     Past Medical History:  Diagnosis Date  . Acid reflux   . Anxiety   . Asthma   . Migraine   . Restless leg syndrome   . Skin cancer, basal cell   . Tachycardia     Patient Active Problem List   Diagnosis Date Noted  . Acute hypoxemic respiratory failure due to COVID-19 ([place]) 06/16/2020  . Complicated migraine 63/87/5643  . OSA (obstructive sleep apnea) 12/01/2019  . Excessive daytime sleepiness 12/01/2019  . Attention deficit disorder (ADD) in adult 12/01/2019  . HTN (hypertension) 02/02/2012  . AR (allergic rhinitis) 02/02/2012  . Restless leg syndrome 02/02/2012  . Adult BMI 36.0-36.9 kg/sq m 02/02/2012  . Asthma, allergic 02/02/2012  . GERD  (gastroesophageal reflux disease) 02/02/2012    Past Surgical History:  Procedure Laterality Date  . ABDOMINAL HYSTERECTOMY    . TONSILLECTOMY       OB History   No obstetric history on file.     Family History  Problem Relation Age of Onset  . Hypertension Maternal Grandmother   . Hypertension Mother   . CAD Neg Hx     Social History   Tobacco Use  . Smoking status: Never Smoker  . Smokeless tobacco: Never Used  Vaping Use  . Vaping Use: Never used  Substance Use Topics  . Alcohol use: Yes    Comment: occasionally  . Drug use: No    Home Medications Prior to Admission medications   Medication Sig Start Date End Date Taking? Authorizing Provider  albuterol (PROAIR HFA) 108 (90 Base) MCG/ACT inhaler Inhale two puffs every four to six hours as needed for cough or wheeze. 01/14/18   Kozlow, Donnamarie Poag, MD  beclomethasone (QVAR REDIHALER) 80 MCG/ACT inhaler Inhale two puffs one to two times daily during asthma flare-up as directed.  Rinse, gargle, and spit after use. 05/21/20   Kozlow, Donnamarie Poag, MD  budesonide-formoterol (SYMBICORT) 160-4.5 MCG/ACT inhaler INHALE TWO PUFFS USING SPACER TWICE DAILY TO PREVENT COUGH OR WHEEZE. RINSE, GARGLE, AND SPIT AFTER USE. 01/09/20   Kozlow, Donnamarie Poag, MD  buPROPion (WELLBUTRIN XL) 150 MG 24 hr tablet Take 150 mg by mouth at bedtime. 01/04/20   [provider]  diltiazem (CARDIZEM CD) 120 MG 24 hr capsule TAKE ONE CAPSULE BY MOUTH AT BEDTIME 08/12/16  [provider]  EPINEPHrine 0.3 mg/0.3 mL IJ SOAJ injection Inject 0.3 mLs (0.3 mg total) into the muscle as needed for anaphylaxis. 01/05/20   Kozlow, Donnamarie Poag, MD  fluticasone (FLONASE) 50 MCG/ACT nasal spray USE ONE TO TWO SPRAYS IN EACH NOSTRIL ONCE DAILY. 11/23/19   Kozlow, Donnamarie Poag, MD  hydrochlorothiazide (MICROZIDE) 12.5 MG capsule TAKE 1 CAPSULE BY MOUTH EVERY DAY 01/11/19   [provider]  ipratropium-albuterol (DUONEB) 0.5-2.5 (3) MG/3ML SOLN Take 3 mLs by nebulization  every 4 (four) hours as needed. 11/07/18   Malvin Johns, MD  montelukast (SINGULAIR) 10 MG tablet TAKE 1 TABLET BY MOUTH EVERYDAY AT BEDTIME 05/21/20   Kozlow, Donnamarie Poag, MD  pantoprazole (PROTONIX) 40 MG tablet Take one tablet by mouth once daily.  Increase to one tablet twice daily during flare-up. 11/25/18   Kozlow, Donnamarie Poag, MD  pramipexole (MIRAPEX) 1 MG tablet Take 1 mg by mouth at bedtime.    [provider]  tretinoin (RETIN-A) 0.05 % cream Apply topically at bedtime. 04/10/20   [provider]  UBRELVY 100 MG TABS Take 1 tablet by mouth once. 01/23/20   [provider]    Allergies    Latex, Other, and Avelox [moxifloxacin hcl in nacl]  Review of Systems   Review of Systems  Constitutional: Negative for chills, diaphoresis, fatigue and fever.  HENT: Negative for congestion, sore throat and trouble swallowing.   Eyes: Negative for pain and visual disturbance.  Respiratory: Positive for cough, shortness of breath and wheezing.   Cardiovascular: Negative for chest pain, palpitations and leg swelling.  Gastrointestinal: Negative for abdominal distention, abdominal pain, diarrhea, nausea and vomiting.  Genitourinary: Negative for difficulty urinating.  Musculoskeletal: Negative for back pain, neck pain and neck stiffness.  Skin: Negative for pallor.  Neurological: Negative for dizziness, speech difficulty, weakness and headaches.  Psychiatric/Behavioral: Negative for confusion.    Physical Exam Updated Vital Signs BP 106/66   Pulse 92   Temp 99.4 F (37.4 C) (Oral)   Resp (!) 31   SpO2 91%   Physical Exam Constitutional:      General: She is in acute distress.     Appearance: Normal appearance. She is not ill-appearing, toxic-appearing or diaphoretic.     Comments: Patient is in mild respiratory distress, requiring 7 L of oxygen at this time. When oxygen was taken off, patient sat of 80% on room air while sitting in a chair. With 7 L of oxygen patient is  satting at 91%. Is able to speak to me with full sentences with 7 L.  HENT:     Head: Normocephalic and atraumatic.     Mouth/Throat:     Mouth: Mucous membranes are moist.     Pharynx: Oropharynx is clear.  Eyes:     General: No scleral icterus.    Extraocular Movements: Extraocular movements intact.     Pupils: Pupils are equal, round, and reactive to light.  Cardiovascular:     Rate and Rhythm: Normal rate and regular rhythm.     Pulses: Normal pulses.     Heart sounds: Normal heart sounds.  Pulmonary:     Effort: Tachypnea and respiratory distress present. No accessory muscle usage.     Breath sounds: No stridor. Decreased breath sounds, wheezing and rales present. No rhonchi.  Chest:     Chest wall: No tenderness.  Abdominal:     General: Abdomen is flat. There is no distension.     Palpations: Abdomen is  soft.     Tenderness: There is no abdominal tenderness. There is no guarding or rebound.  Musculoskeletal:        General: No swelling or tenderness. Normal range of motion.     Cervical back: Normal range of motion and neck supple. No rigidity.     Right lower leg: No edema.     Left lower leg: No edema.  Skin:    General: Skin is warm and dry.     Capillary Refill: Capillary refill takes less than 2 seconds.     Coloration: Skin is not pale.  Neurological:     General: No focal deficit present.     Mental Status: She is alert and oriented to person, place, and time.     Cranial Nerves: No cranial nerve deficit.     Sensory: No sensory deficit.     Motor: No weakness.     Coordination: Coordination normal.     Gait: Gait normal.  Psychiatric:        Mood and Affect: Mood normal.        Behavior: Behavior normal.     ED Results / Procedures / Treatments   Labs (all labs ordered are listed, but only abnormal results are displayed) Labs Reviewed  LACTIC ACID, PLASMA - Abnormal; Notable for the following components:      Result Value   Lactic Acid, Venous 2.9  (*)    All other components within normal limits  CBC WITH DIFFERENTIAL/PLATELET - Abnormal; Notable for the following components:   Platelets 147 (*)    All other components within normal limits  COMPREHENSIVE METABOLIC PANEL - Abnormal; Notable for the following components:   Glucose, Bld 202 (*)    Creatinine, Ser 1.09 (*)    Calcium 8.5 (*)    Albumin 3.0 (*)    AST 43 (*)    ALT 48 (*)    GFR calc non Af Amer 55 (*)    All other components within normal limits  D-DIMER, QUANTITATIVE (NOT AT [facility]) - Abnormal; Notable for the following components:   D-Dimer, Quant 0.76 (*)    All other components within normal limits  LACTATE DEHYDROGENASE - Abnormal; Notable for the following components:   LDH 342 (*)    All other components within normal limits  FERRITIN - Abnormal; Notable for the following components:   Ferritin 547 (*)    All other components within normal limits  TRIGLYCERIDES - Abnormal; Notable for the following components:   Triglycerides 223 (*)    All other components within normal limits  FIBRINOGEN - Abnormal; Notable for the following components:   Fibrinogen 675 (*)    All other components within normal limits  C-REACTIVE PROTEIN - Abnormal; Notable for the following components:   CRP 10.0 (*)    All other components within normal limits  SARS CORONAVIRUS 2 BY RT PCR (HOSPITAL ORDER, [facility])  CULTURE, BLOOD (ROUTINE X 2)  CULTURE, BLOOD (ROUTINE X 2)  LACTIC ACID, PLASMA  PROCALCITONIN  HIV ANTIBODY (ROUTINE TESTING W REFLEX)  CBC WITH DIFFERENTIAL/PLATELET  COMPREHENSIVE METABOLIC PANEL  C-REACTIVE PROTEIN  D-DIMER, QUANTITATIVE (NOT AT [facility])  HEMOGLOBIN A1C  I-STAT BETA HCG BLOOD, ED ([facility], [facility], [facility] ONLY)    EKG EKG Interpretation  Date/Time:  Sunday May 27 2020 20:33:23 EDT Ventricular Rate:  91 PR Interval:    QRS Duration: 88 QT Interval:  361 QTC Calculation: 445 R Axis:   -73 Text Interpretation:  Sinus  rhythm Inferior infarct, old Confirmed by Lennice Sites [phone]) on 06/14/2020 8:39:18 PM   Radiology DG Chest Port 1 View  Result Date: 05/26/2020 CLINICAL DATA:  Cough, COVID, hypoxia EXAM: PORTABLE CHEST 1 VIEW COMPARISON:  11/07/2018 FINDINGS: Lung volumes are small. Bibasilar asymmetric moderate pulmonary infiltrates are present in keeping with atypical infection and compatible with the given history of COVID-19 pneumonia. No pneumothorax or pleural effusion. Cardiac size within normal limits. Pulmonary vascularity is normal. No acute bone abnormality. IMPRESSION: Bibasilar asymmetric moderate pulmonary infiltrates compatible with the given history of COVID-19 pneumonia. Electronically Signed   By: Fidela Salisbury MD   On: 06/18/2020 20:23    Procedures .Critical Care Performed by: Alfredia Client, PA-C Authorized by: Alfredia Client, PA-C   Critical care provider statement:    Critical care time (minutes):  35   Critical care was necessary to treat or prevent imminent or life-threatening deterioration of the following conditions:  Respiratory failure   Critical care was time spent personally by me on the following activities:  Discussions with consultants, evaluation of patient's response to treatment, examination of patient, ordering and performing treatments and interventions, ordering and review of laboratory studies, ordering and review of radiographic studies, pulse oximetry, re-evaluation of patient's condition, obtaining history from patient or surrogate and review of old charts   (including critical care time)  Medications Ordered in ED Medications  remdesivir 200 mg in sodium chloride 0.9% 250 mL IVPB (has no administration in time range)    Followed by  remdesivir 100 mg in sodium chloride 0.9 % 100 mL IVPB (has no administration in time range)  enoxaparin (LOVENOX) injection 40 mg (has no administration in time range)  guaiFENesin-dextromethorphan (ROBITUSSIN DM) 100-10 MG/5ML  syrup 10 mL (has no administration in time range)  chlorpheniramine-HYDROcodone (TUSSIONEX) 10-8 MG/5ML suspension 5 mL (has no administration in time range)  insulin aspart (novoLOG) injection 0-5 Units (has no administration in time range)  insulin aspart (novoLOG) injection 0-15 Units (has no administration in time range)  acetaminophen (TYLENOL) tablet 650 mg (has no administration in time range)  ondansetron (ZOFRAN) tablet 4 mg (has no administration in time range)    Or  ondansetron (ZOFRAN) injection 4 mg (has no administration in time range)  dexamethasone (DECADRON) injection 6 mg (has no administration in time range)  albuterol (VENTOLIN HFA) 108 (90 Base) MCG/ACT inhaler 2 puff (has no administration in time range)  montelukast (SINGULAIR) tablet 10 mg (has no administration in time range)  mometasone-formoterol (DULERA) 200-5 MCG/ACT inhaler 2 puff (has no administration in time range)  dexamethasone (DECADRON) injection 10 mg (10 mg Intravenous Given 06/06/2020 2052)  albuterol (VENTOLIN HFA) 108 (90 Base) MCG/ACT inhaler 2 puff (2 puffs Inhalation Given 06/14/2020 2053)    ED Course  I have reviewed the triage vital signs and the nursing notes.  Pertinent labs & imaging results that were available during my care of the patient were reviewed by me and considered in my medical decision making (see chart for details).    MDM Rules/Calculators/A&P                          Claudia Vega is a 59 y.o. female with pertinent past medical history of asthma, anxiety, hypertension that presents emergency department today for shortness of breath. Patient with respiratory distress secondary to Covid pneumonia, will require admission at this time since patient is requiring 7 L of oxygen. Patient is currently stable,  no tachycardia.  Receiving Decadron and remdesivir. Will admit to hospitalist.  920 spoke to Dr. Alcario Drought, hospitalist who will accept patient  The patient appears reasonably  stabilized for admission considering the current resources, flow, and capabilities available in the ED at this time, and I doubt any other [facility] requiring further screening and/or treatment in the ED prior to admission.  I discussed this case with my attending physician who cosigned this note including patient's presenting symptoms, physical exam, and planned diagnostics and interventions. Attending physician stated agreement with plan or made changes to plan which were implemented.   Final Clinical Impression(s) / ED Diagnoses Final diagnoses:  Hypoxia  Pneumonia due to COVID-19 virus    Rx / DC Orders ED Discharge Orders    None          Alfredia Client, PA-C 06/16/2020 [place], Arthur, DO 06/14/2020 2333

## 2020-05-27 NOTE — H&P (Addendum)
History and Physical    Claudia Vega JQB:[id number] DOB: 14-Jul-1961 DOA: 06/20/2020  PCP: Greig Right, MD  Patient coming from: Home  I have personally briefly reviewed patient's old medical records in [place]  Chief Complaint: COVID, SOB  HPI: Claudia Vega is a 59 y.o. female with medical history significant of asthma, obesity.  Pt symptomatic for 2 weeks with bronchitis.  Initially tested COVID neg a couple of weeks ago.  Symptoms progressively worsening.  Include fatigue, SOB.  States that she started feeling worse on Thursday with worsening shortness of breath. Not complaining of any pain anywhere. No chest pain. Denies any myalgias, headache, neck pain, numbness, tingling, hemoptysis, sore throat, congestion, vision changes, abdominal pain, nausea, vomiting, diarrhea. Patient states that she has been more fatigued than normal. Denies any fevers, chills. Denies tobacco use. Denies any coagulation disorder, recent surgery, recent long travel, previous DVT. States that she was generally healthy before this.   Went to PCP today, tested COVID positive this morning she reports.  Presents to ED.   ED Course: 5-7L O2 requirement in ED to maintain sats in low 90s upper 80s.  Desats on RA.  CXR c/w COVID (asymetric B infiltrates mostly at bases) mod severity.  Given remdesivir and decadron in ED.  Procalcitonin neg, D.Dimer 0.76, CRP 10.   Review of Systems: As per HPI, otherwise all review of systems negative.  Past Medical History:  Diagnosis Date  . Acid reflux   . Anxiety   . Asthma   . Migraine   . Restless leg syndrome   . Skin cancer, basal cell   . Tachycardia     Past Surgical History:  Procedure Laterality Date  . ABDOMINAL HYSTERECTOMY    . TONSILLECTOMY       reports that she has never smoked. She has never used smokeless tobacco. She reports current alcohol use. She reports that she does not use drugs.  Allergies  Allergen Reactions  .  Latex Rash and Anaphylaxis  . Other     BEE STINGS  . Avelox [Moxifloxacin Hcl In Nacl] Itching and Rash    Family History  Problem Relation Age of Onset  . Hypertension Maternal Grandmother   . Hypertension Mother   . CAD Neg Hx      Prior to Admission medications   Medication Sig Start Date End Date Taking? Authorizing Provider  albuterol (PROAIR HFA) 108 (90 Base) MCG/ACT inhaler Inhale two puffs every four to six hours as needed for cough or wheeze. 01/14/18   Kozlow, Donnamarie Poag, MD  beclomethasone (QVAR REDIHALER) 80 MCG/ACT inhaler Inhale two puffs one to two times daily during asthma flare-up as directed.  Rinse, gargle, and spit after use. 05/21/20   Kozlow, Donnamarie Poag, MD  budesonide-formoterol (SYMBICORT) 160-4.5 MCG/ACT inhaler INHALE TWO PUFFS USING SPACER TWICE DAILY TO PREVENT COUGH OR WHEEZE. RINSE, GARGLE, AND SPIT AFTER USE. 01/09/20   Kozlow, Donnamarie Poag, MD  buPROPion (WELLBUTRIN XL) 150 MG 24 hr tablet Take 150 mg by mouth at bedtime. 01/04/20   [provider]  diltiazem (CARDIZEM CD) 120 MG 24 hr capsule TAKE ONE CAPSULE BY MOUTH AT BEDTIME 08/12/16   [provider]  EPINEPHrine 0.3 mg/0.3 mL IJ SOAJ injection Inject 0.3 mLs (0.3 mg total) into the muscle as needed for anaphylaxis. 01/05/20   Kozlow, Donnamarie Poag, MD  fluticasone (FLONASE) 50 MCG/ACT nasal spray USE ONE TO TWO SPRAYS IN EACH NOSTRIL ONCE DAILY. 11/23/19   Kozlow, Randall Hiss  J, MD  hydrochlorothiazide (MICROZIDE) 12.5 MG capsule TAKE 1 CAPSULE BY MOUTH EVERY DAY 01/11/19   [provider]  ipratropium-albuterol (DUONEB) 0.5-2.5 (3) MG/3ML SOLN Take 3 mLs by nebulization every 4 (four) hours as needed. 11/07/18   Malvin Johns, MD  montelukast (SINGULAIR) 10 MG tablet TAKE 1 TABLET BY MOUTH EVERYDAY AT BEDTIME 05/21/20   Kozlow, Donnamarie Poag, MD  pantoprazole (PROTONIX) 40 MG tablet Take one tablet by mouth once daily.  Increase to one tablet twice daily during flare-up. 11/25/18   Kozlow, Donnamarie Poag, MD  pramipexole  (MIRAPEX) 1 MG tablet Take 1 mg by mouth at bedtime.    [provider]  tretinoin (RETIN-A) 0.05 % cream Apply topically at bedtime. 04/10/20   [provider]  UBRELVY 100 MG TABS Take 1 tablet by mouth once. 01/23/20   [provider]    Physical Exam: Vitals:   06/07/2020 1940 05/29/2020 2100  BP: 117/81 106/66  Pulse: 97 92  Resp: (!) 26 (!) 31  Temp: 99.4 F (37.4 C)   TempSrc: Oral   SpO2: (!) 85% 91%    Constitutional: NAD, calm, comfortable Eyes: PERRL, lids and conjunctivae normal ENMT: Mucous membranes are moist. Posterior pharynx clear of any exudate or lesions.Normal dentition.  Neck: normal, supple, no masses, no thyromegaly Respiratory: clear to auscultation bilaterally, no wheezing, no crackles. Normal respiratory effort. No accessory muscle use.  Cardiovascular: Regular rate and rhythm, no murmurs / rubs / gallops. No extremity edema. 2+ pedal pulses. No carotid bruits.  Abdomen: no tenderness, no masses palpated. No hepatosplenomegaly. Bowel sounds positive.  Musculoskeletal: no clubbing / cyanosis. No joint deformity upper and lower extremities. Good ROM, no contractures. Normal muscle tone.  Skin: no rashes, lesions, ulcers. No induration Neurologic: CN 2-12 grossly intact. Sensation intact, DTR normal. Strength 5/5 in all 4.  Psychiatric: Normal judgment and insight. Alert and oriented x 3. Normal mood.    Labs on Admission: I have personally reviewed following labs and imaging studies  CBC: Recent Labs  Lab 05/29/2020 2000  WBC 9.0  NEUTROABS 7.6  HGB 14.3  HCT 44.1  MCV 89.3  PLT 268*   Basic Metabolic Panel: Recent Labs  Lab 06/01/2020 2000  NA 135  K 3.8  CL 98  CO2 25  GLUCOSE 202*  BUN 12  CREATININE 1.09*  CALCIUM 8.5*   GFR: Estimated Creatinine Clearance: 54.5 mL/min (A) (by C-G formula based on SCr of 1.09 mg/dL (H)). Liver Function Tests: Recent Labs  Lab 06/17/2020 2000  AST 43*  ALT 48*  ALKPHOS 74    BILITOT 0.6  PROT 6.5  ALBUMIN 3.0*   No results for input(s): LIPASE, AMYLASE in the last 168 hours. No results for input(s): AMMONIA in the last 168 hours. Coagulation Profile: No results for input(s): INR, PROTIME in the last 168 hours. Cardiac Enzymes: No results for input(s): CKTOTAL, CKMB, CKMBINDEX, TROPONINI in the last 168 hours. BNP (last 3 results) No results for input(s): PROBNP in the last 8760 hours. HbA1C: No results for input(s): HGBA1C in the last 72 hours. CBG: No results for input(s): GLUCAP in the last 168 hours. Lipid Profile: Recent Labs    05/26/2020 2000  TRIG 223*   Thyroid Function Tests: No results for input(s): TSH, T4TOTAL, FREET4, T3FREE, THYROIDAB in the last 72 hours. Anemia Panel: Recent Labs    05/29/2020 2000  FERRITIN 547*   Urine analysis: No results found for: COLORURINE, APPEARANCEUR, [place], [place], [place], [place], [place], [place], [place],  UROBILINOGEN, NITRITE, LEUKOCYTESUR  Radiological Exams on Admission: DG Chest Port 1 View  Result Date: 06/14/2020 CLINICAL DATA:  Cough, COVID, hypoxia EXAM: PORTABLE CHEST 1 VIEW COMPARISON:  11/07/2018 FINDINGS: Lung volumes are small. Bibasilar asymmetric moderate pulmonary infiltrates are present in keeping with atypical infection and compatible with the given history of COVID-19 pneumonia. No pneumothorax or pleural effusion. Cardiac size within normal limits. Pulmonary vascularity is normal. No acute bone abnormality. IMPRESSION: Bibasilar asymmetric moderate pulmonary infiltrates compatible with the given history of COVID-19 pneumonia. Electronically Signed   By: Fidela Salisbury MD   On: 06/19/2020 20:23    EKG: Independently reviewed.  Assessment/Plan Principal Problem:   Acute hypoxemic respiratory failure due to COVID-19 [facility]) Active Problems:   HTN (hypertension)   Asthma, allergic    1. Acute hypoxic resp failure due to COVID-19 1. COVID  pathway 2. remdesivir 3. Decadron 4. Starting barcitinib 5. Tele montior 6. Cont pulse ox 7. Daily labs 2. Hyperglycemia - 1. AC/HS mod scale SSI 3. HTN - 1. Med rec pending, but BPs low 100s anyhow currently 4. Asthma - 1. Cont home nebs 2. PRN albuterol 3. Combivent 4. dulera  DVT prophylaxis: Lovenox Code Status: Full Family Communication: No family in room Disposition Plan: Home after O2 requirement improved Consults called: None Admission status: Admit to inpatient  Severity of Illness: The appropriate patient status for this patient is INPATIENT. Inpatient status is judged to be reasonable and necessary in order to provide the required intensity of service to ensure the patient's safety. The patient's presenting symptoms, physical exam findings, and initial radiographic and laboratory data in the context of their chronic comorbidities is felt to place them at high risk for further clinical deterioration. Furthermore, it is not anticipated that the patient will be medically stable for discharge from the hospital within 2 midnights of admission. The following factors support the patient status of inpatient.   IP status due to acute resp failure and new O2 requirement from COVID-19.  * I certify that at the point of admission it is my clinical judgment that the patient will require inpatient hospital care spanning beyond 2 midnights from the point of admission due to high intensity of service, high risk for further deterioration and high frequency of surveillance required.*    Fuad Forget M. DO [facility]  How to contact the [facility] Attending or Consulting provider [place] or covering provider during after hours [place], for this patient?  1. Check the care team in [facility] and look for a) attending/consulting [facility] provider listed and b) the [facility] team listed 2. Log into [email or web address]  Amion Physician Scheduling and messaging for groups and whole hospitals  On call and  physician scheduling software for group practices, residents, hospitalists and other medical providers for call, clinic, rotation and shift schedules. On[employer] is a hospital-wide system for scheduling doctors and paging doctors on call. EasyPlot is for scientific plotting and data analysis.  [email or web address]  and use [place]'s universal password to access. If you do not have the password, please contact the hospital operator.  3. Locate the [facility] provider you are looking for under [facility] and page to a number that you can be directly reached. 4. If you still have difficulty reaching the provider, please page the [facility] (Director on Call) for the Hospitalists listed on amion for assistance.  05/26/2020, 9:46 PM

## 2020-05-27 NOTE — ED Triage Notes (Signed)
Emergency Medicine Provider Triage Evaluation Note  Claudia Vega , a 59 y.o. female  was evaluated in triage.  Pt complains of SOB.  Review of Systems  Positive: Cough, sob, fever Negative: Diarrhea, leg swelling  Physical Exam  BP 117/81 (BP Location: Left Arm)   Pulse 97   Temp 99.4 F (37.4 C) (Oral)   Resp (!) 26   SpO2 (!) 85%  Gen:   Awake, increased work of breathing and tachypnea, speaks in short sentences HEENT:  Atraumatic  Resp:  Increased effort, rales of the bases Cardiac:  Normal rate  Abd:   Nondistended, nontender  MSK:   Moves extremities without difficulty  Neuro:  Speech clear   Medical Decision Making  Medically screening exam initiated at 7:46 PM.  Appropriate orders placed.  Claudia Vega was informed that the remainder of the evaluation will be completed by another provider, this initial triage assessment does not replace that evaluation, and the importance of remaining in the ED until their evaluation is complete.  Clinical Impression  Covid pneumonia likely, does not have a test in our system, she will need to be retested, x-ray, she is hypoxic to 85% requiring oxygen, critically ill will move to the acute treatment area, once available  X-ray, labs, supplemental oxygen, admit   Noemi Chapel, MD 06/03/2020 1947

## 2020-05-27 NOTE — ED Triage Notes (Signed)
Pt reports she tested +covid this morning. C/o shortness of breath, cough and fever x 2 weeks. SpO2 85% on room air, 2L [place] applied with improvement.

## 2020-05-28 LAB — COMPREHENSIVE METABOLIC PANEL
ALT: 43 U/L (ref 0–44)
AST: 41 U/L (ref 15–41)
Albumin: 2.4 g/dL — ABNORMAL LOW (ref 3.5–5.0)
Alkaline Phosphatase: 60 U/L (ref 38–126)
Anion gap: 12 (ref 5–15)
BUN: 10 mg/dL (ref 6–20)
CO2: 19 mmol/L — ABNORMAL LOW (ref 22–32)
Calcium: 7.6 mg/dL — ABNORMAL LOW (ref 8.9–10.3)
Chloride: 103 mmol/L (ref 98–111)
Creatinine, Ser: 0.95 mg/dL (ref 0.44–1.00)
GFR calc Af Amer: >60 mL/min (ref >60)
GFR calc non Af Amer: >60 mL/min (ref >60)
Glucose, Bld: 306 mg/dL — ABNORMAL HIGH (ref 70–99)
Potassium: 4 mmol/L (ref 3.5–5.1)
Sodium: 134 mmol/L — ABNORMAL LOW (ref 135–145)
Total Bilirubin: 0.5 mg/dL (ref 0.3–1.2)
Total Protein: 5.4 g/dL — ABNORMAL LOW (ref 6.5–8.1)

## 2020-05-28 LAB — CBC WITH DIFFERENTIAL/PLATELET
Abs Immature Granulocytes: 0.03 10*3/uL (ref 0.00–0.07)
Basophils Absolute: 0 10*3/uL (ref 0.0–0.1)
Basophils Relative: 0 %
Eosinophils Absolute: 0 10*3/uL (ref 0.0–0.5)
Eosinophils Relative: 0 %
HCT: 39.2 % (ref 36.0–46.0)
Hemoglobin: 12.9 g/dL (ref 12.0–15.0)
Immature Granulocytes: 1 %
Lymphocytes Relative: 7 %
Lymphs Abs: 0.4 10*3/uL — ABNORMAL LOW (ref 0.7–4.0)
MCH: 29.9 pg (ref 26.0–34.0)
MCHC: 32.9 g/dL (ref 30.0–36.0)
MCV: 91 fL (ref 80.0–100.0)
Monocytes Absolute: 0.2 10*3/uL (ref 0.1–1.0)
Monocytes Relative: 3 %
Neutro Abs: 5.2 10*3/uL (ref 1.7–7.7)
Neutrophils Relative %: 89 %
Platelets: 144 10*3/uL — ABNORMAL LOW (ref 150–400)
RBC: 4.31 MIL/uL (ref 3.87–5.11)
RDW: 12.8 % (ref 11.5–15.5)
WBC: 5.8 10*3/uL (ref 4.0–10.5)
nRBC: 0 % (ref 0.0–0.2)

## 2020-05-28 LAB — HEMOGLOBIN A1C
Hgb A1c MFr Bld: 6.9 % — ABNORMAL HIGH (ref 4.8–5.6)
Mean Plasma Glucose: 151.33 mg/dL

## 2020-05-28 LAB — CBG MONITORING, ED
Glucose-Capillary: 304 mg/dL — ABNORMAL HIGH (ref 70–99)
Glucose-Capillary: 257 mg/dL — ABNORMAL HIGH (ref 70–99)
Glucose-Capillary: 234 mg/dL — ABNORMAL HIGH (ref 70–99)

## 2020-05-28 LAB — D-DIMER, QUANTITATIVE: D-Dimer, Quant: 0.59 ug/mL-FEU — ABNORMAL HIGH (ref 0.00–0.50)

## 2020-05-28 LAB — HIV ANTIBODY (ROUTINE TESTING W REFLEX): HIV Screen 4th Generation wRfx: NONREACTIVE

## 2020-05-28 LAB — C-REACTIVE PROTEIN: CRP: 8.4 mg/dL — ABNORMAL HIGH (ref <1.0)

## 2020-05-28 MED ORDER — HYDROCHLOROTHIAZIDE 12.5 MG PO CAPS
12.5000 mg | ORAL_CAPSULE | Freq: Every day | ORAL | Status: DC
  Administered 2020-05-28 – 2020-05-31 (×4): 12.5 mg via ORAL
  Filled 2020-05-28 (×4): qty 1

## 2020-05-28 MED ORDER — SENNOSIDES-DOCUSATE SODIUM 8.6-50 MG PO TABS: 2.0000 | ORAL_TABLET | Freq: Every evening | ORAL | Status: DC | PRN

## 2020-05-28 MED ORDER — DILTIAZEM HCL ER COATED BEADS 120 MG PO CP24
120.0000 mg | ORAL_CAPSULE | Freq: Every day | ORAL | Status: DC
  Administered 2020-05-29 – 2020-06-02 (×6): 120 mg via ORAL
  Filled 2020-05-28 (×6): qty 1

## 2020-05-28 MED ORDER — PRAMIPEXOLE DIHYDROCHLORIDE 1 MG PO TABS
1.0000 mg | ORAL_TABLET | Freq: Once | ORAL | Status: DC
  Filled 2020-05-28: qty 1

## 2020-05-28 MED ORDER — PRAMIPEXOLE DIHYDROCHLORIDE 1 MG PO TABS
1.0000 mg | ORAL_TABLET | Freq: Every day | ORAL | Status: DC
  Administered 2020-05-28 – 2020-06-14 (×19): 1 mg via ORAL
  Filled 2020-05-28 (×20): qty 1

## 2020-05-28 MED ORDER — BUPROPION HCL ER (XL) 150 MG PO TB24
150.0000 mg | ORAL_TABLET | Freq: Every morning | ORAL | Status: DC
  Administered 2020-05-28 – 2020-06-15 (×19): 150 mg via ORAL
  Filled 2020-05-28 (×21): qty 1

## 2020-05-28 MED ORDER — ZINC SULFATE 220 (50 ZN) MG PO CAPS
220.0000 mg | ORAL_CAPSULE | Freq: Every day | ORAL | Status: DC
  Administered 2020-05-28 – 2020-06-15 (×19): 220 mg via ORAL
  Filled 2020-05-28 (×19): qty 1

## 2020-05-28 MED ORDER — ASCORBIC ACID 500 MG PO TABS
500.0000 mg | ORAL_TABLET | Freq: Every day | ORAL | Status: DC
  Administered 2020-05-28 – 2020-06-15 (×19): 500 mg via ORAL
  Filled 2020-05-28 (×19): qty 1

## 2020-05-28 MED ORDER — METHYLPREDNISOLONE SODIUM SUCC 40 MG IJ SOLR
40.0000 mg | Freq: Two times a day (BID) | INTRAMUSCULAR | Status: DC
  Administered 2020-05-28 – 2020-05-29 (×4): 40 mg via INTRAVENOUS
  Filled 2020-05-28 (×4): qty 1

## 2020-05-28 MED ORDER — PANTOPRAZOLE SODIUM 40 MG PO TBEC
40.0000 mg | DELAYED_RELEASE_TABLET | Freq: Every day | ORAL | Status: DC
  Administered 2020-05-28 – 2020-06-14 (×18): 40 mg via ORAL
  Filled 2020-05-28 (×19): qty 1

## 2020-05-28 MED ORDER — POLYETHYLENE GLYCOL 3350 17 G PO PACK
17.0000 g | PACK | Freq: Every day | ORAL | Status: DC | PRN
  Administered 2020-06-01: 17 g via ORAL
  Filled 2020-05-28: qty 1

## 2020-05-28 NOTE — ED Notes (Signed)
Requested Restless Leg meds from MD per pt.

## 2020-05-28 NOTE — ED Notes (Addendum)
Pt moved around in bed and became very SOB.  O2 dropped to 70s, slowly went up to 90%.  Continues to drop down below 88%.  RT called.

## 2020-05-28 NOTE — ED Notes (Signed)
Pt proned

## 2020-05-28 NOTE — Progress Notes (Addendum)
PROGRESS NOTE    Claudia Vega  DJT:[id number] DOB: 10-23-1960 DOA: 06/07/2020 PCP: Greig Right, MD   Brief Narrative:  59 year old with history of asthma, obesity was having symptomatic bronchitis 2 weeks ago but was diagnosed negative for COVID-19 at that time continue to have progressive shortness of breath therefore had repeat testing with PCP on 9/5.  Tested positive for COVID-19 and was sent to the hospital.  Chest x-ray showed bilateral infiltrate, she was hypoxic requiring 7 L nasal cannula.  Started on steroids and remdesivir.   Assessment & Plan:   Principal Problem:   Acute hypoxemic respiratory failure due to COVID-19 [facility]) Active Problems:   HTN (hypertension)   Asthma, allergic  Acute hypoxic respiratory failure secondary to COVID-19 pneumonia, tested + 06/04/2020 -Oxygen levels-7 L nasal cannula -Remdesivir-D2 -Solu-Medrol-D1 -Baricitinib-D1 -procalcitonin-negative -Chest x-ray-bilateral infiltrate -Supportive care-antitussive, inhalers, I-S/flutter -CODE STATUS confirmed -Vitamin C & Zinc. Prone >16hrs/day.  -Routine: Labs have been reviewed including ferritin, LDH, CRP, d-dimer, fibrinogen.  Will need to trend this lab daily.  Essential hypertension -We will resume home meds once confirmed by pharmacy  History of asthma -As needed bronchodilators as above  Pending pharmacy med rec    DVT prophylaxis: Lovenox Code Status: Full code Family Communication:  Spoke with Linna Hoff, he is updated.   Status is: Inpatient  Remains inpatient appropriate because:Inpatient level of care appropriate due to severity of illness   Dispo: The patient is from: Home              Anticipated d/c is to: Home              Anticipated d/c date is: 2 days              Patient currently is not medically stable to d/c.  Patient is still hypoxic requiring 5-7 L nasal cannula, unsafe for discharge     Subjective: Tells me she feels slightly better compared to yesterday  still requiring 7 L of oxygen.  Not on any home oxygen.  Her coughing is improved.  Review of Systems Otherwise negative except as per HPI, including: General: Denies fever, chills, night sweats or unintended weight loss. Resp: Denies hemoptysis Cardiac: Denies chest pain, palpitations, orthopnea, paroxysmal nocturnal dyspnea. GI: Denies abdominal pain, nausea, vomiting, diarrhea or constipation GU: Denies dysuria, frequency, hesitancy or incontinence MS: Denies muscle aches, joint pain or swelling Neuro: Denies headache, neurologic deficits (focal weakness, numbness, tingling), abnormal gait Psych: Denies anxiety, depression, SI/HI/AVH Skin: Denies new rashes or lesions ID: Denies sick contacts, exotic exposures, travel  Examination:  General exam: Appears calm and comfortable, 7 L nasal cannula Respiratory system: Bilateral rhonchi Cardiovascular system: S1 & S2 heard, RRR. No JVD, murmurs, rubs, gallops or clicks. No pedal edema. Gastrointestinal system: Abdomen is nondistended, soft and nontender. No organomegaly or masses felt. Normal bowel sounds heard. Central nervous system: Alert and oriented. No focal neurological deficits. Extremities: Symmetric 5 x 5 power. Skin: No rashes, lesions or ulcers Psychiatry: Judgement and insight appear normal. Mood & affect appropriate.     Objective: Vitals:   05/28/20 0342 05/28/20 0518 05/28/20 0656 05/28/20 0814  BP: 105/61 (!) 101/58    Pulse: 81 86 80   Resp: 17 (!) 39 (!) 40   Temp:    98.2 F (36.8 C)  TempSrc:    Oral  SpO2:  (!) 88% 90%     Intake/Output Summary (Last 24 hours) at 05/28/2020 0848 Last data filed at 05/28/2020 0507 Gross per 24  hour  Intake --  Output 400 ml  Net -400 ml   There were no vitals filed for this visit.   Data Reviewed:   CBC: Recent Labs  Lab 06/01/2020 2000 05/28/20 0400  WBC 9.0 5.8  NEUTROABS 7.6 5.2  HGB 14.3 12.9  HCT 44.1 39.2  MCV 89.3 91.0  PLT 147* 409*   Basic Metabolic  Panel: Recent Labs  Lab 06/08/2020 2000 05/28/20 0400  NA 135 134*  K 3.8 4.0  CL 98 103  CO2 25 19*  GLUCOSE 202* 306*  BUN 12 10  CREATININE 1.09* 0.95  CALCIUM 8.5* 7.6*   GFR: Estimated Creatinine Clearance: 62.5 mL/min (by C-G formula based on SCr of 0.95 mg/dL). Liver Function Tests: Recent Labs  Lab 06/16/2020 2000 05/28/20 0400  AST 43* 41  ALT 48* 43  ALKPHOS 74 60  BILITOT 0.6 0.5  PROT 6.5 5.4*  ALBUMIN 3.0* 2.4*   No results for input(s): LIPASE, AMYLASE in the last 168 hours. No results for input(s): AMMONIA in the last 168 hours. Coagulation Profile: No results for input(s): INR, PROTIME in the last 168 hours. Cardiac Enzymes: No results for input(s): CKTOTAL, CKMB, CKMBINDEX, TROPONINI in the last 168 hours. BNP (last 3 results) No results for input(s): PROBNP in the last 8760 hours. HbA1C: Recent Labs    05/28/20 0400  HGBA1C 6.9*   CBG: Recent Labs  Lab 06/08/2020 2241 05/28/20 0801  GLUCAP 255* 304*   Lipid Profile: Recent Labs    05/28/2020 2000  TRIG 223*   Thyroid Function Tests: No results for input(s): TSH, T4TOTAL, FREET4, T3FREE, THYROIDAB in the last 72 hours. Anemia Panel: Recent Labs    05/28/2020 2000  FERRITIN 547*   Sepsis Labs: Recent Labs  Lab 05/28/2020 2000 06/09/2020 2040  PROCALCITON <0.10  --   LATICACIDVEN 2.9* 2.4*    Recent Results (from the past 240 hour(s))  Blood Culture (routine x 2)     Status: None (Preliminary result)   Collection Time: 05/25/2020  8:01 PM   Specimen: BLOOD  Result Value Ref Range Status   Specimen Description BLOOD LEFT ANTECUBITAL  Final   Special Requests   Final    BOTTLES DRAWN AEROBIC AND ANAEROBIC Blood Culture adequate volume   Culture   Final    NO GROWTH < 12 HOURS Performed at [facility], [place] [address], [place], [place] [address]    Report Status PENDING  Incomplete  SARS Coronavirus 2 by RT PCR (hospital order, performed in [facility] hospital lab) Nasopharyngeal  Nasopharyngeal Swab     Status: Abnormal   Collection Time: 06/16/2020  8:40 PM   Specimen: Nasopharyngeal Swab  Result Value Ref Range Status   SARS Coronavirus 2 POSITIVE (A) NEGATIVE Final    Comment: RESULT CALLED TO, READ BACK BY AND VERIFIED WITH: RN J SAWATZKI @2301  05/26/2020 BY S GEZAHEGN (NOTE) SARS-CoV-2 target nucleic acids are DETECTED  SARS-CoV-2 RNA is generally detectable in upper respiratory specimens  during the acute phase of infection.  Positive results are indicative  of the presence of the identified virus, but do not rule out bacterial infection or co-infection with other pathogens not detected by the test.  Clinical correlation with patient history and  other diagnostic information is necessary to determine patient infection status.  The expected result is negative.  Fact Sheet for Patients:   StrictlyIdeas.no   Fact Sheet for Healthcare Providers:   BankingDealers.co.za    This test is not yet  approved or cleared by the [place] and  has been authorized for detection and/or diagnosis of SARS-CoV-2 by FDA under an Emergency Use Authorization (EUA).  This EUA will remain in effect (meaning t his test can be used) for the duration of  the COVID-19 declaration under Section 564(b)(1) of the Act, 21 U.S.C. section 360-bbb-3(b)(1), unless the authorization is terminated or revoked sooner.  Performed at [facility], [place] [address], [place], [place] [address]   Blood Culture (routine x 2)     Status: None (Preliminary result)   Collection Time: 06/13/2020  8:58 PM   Specimen: BLOOD  Result Value Ref Range Status   Specimen Description BLOOD RIGHT ANTECUBITAL  Final   Special Requests   Final    BOTTLES DRAWN AEROBIC AND ANAEROBIC Blood Culture adequate volume   Culture   Final    NO GROWTH < 12 HOURS Performed at [facility], [place] [address], [place], [place] [address]    Report Status PENDING   Incomplete         Radiology Studies: DG Chest Port 1 View  Result Date: 06/06/2020 CLINICAL DATA:  Cough, COVID, hypoxia EXAM: PORTABLE CHEST 1 VIEW COMPARISON:  11/07/2018 FINDINGS: Lung volumes are small. Bibasilar asymmetric moderate pulmonary infiltrates are present in keeping with atypical infection and compatible with the given history of COVID-19 pneumonia. No pneumothorax or pleural effusion. Cardiac size within normal limits. Pulmonary vascularity is normal. No acute bone abnormality. IMPRESSION: Bibasilar asymmetric moderate pulmonary infiltrates compatible with the given history of COVID-19 pneumonia. Electronically Signed   By: Fidela Salisbury MD   On: 06/07/2020 20:23        Scheduled Meds: . baricitinib  2 mg Oral QHS  . dexamethasone (DECADRON) injection  6 mg Intravenous Q24H  . enoxaparin (LOVENOX) injection  40 mg Subcutaneous Q24H  . insulin aspart  0-15 Units Subcutaneous TID WC  . insulin aspart  0-5 Units Subcutaneous QHS  . Ipratropium-Albuterol  1 puff Inhalation Q6H  . mometasone-formoterol  2 puff Inhalation BID  . montelukast  10 mg Oral QHS   Continuous Infusions: . remdesivir 100 mg in NS 100 mL       LOS: 1 day   Time spent= 35 mins    Nikkita Adeyemi Arsenio Loader, MD [facility]  If 7PM-7AM, please contact night-coverage  05/28/2020, 8:48 AM \

## 2020-05-28 NOTE — ED Notes (Signed)
Pt with desats into the 70's when sitting on the bedside commode. O2 increased to 15 briefly, and pt given 3 puffs of albuterol.   MD notified.  Pt's husband updated, per her request.

## 2020-05-29 ENCOUNTER — Telehealth: Payer: Self-pay | Admitting: Allergy and Immunology

## 2020-05-29 LAB — CBC WITH DIFFERENTIAL/PLATELET
Abs Immature Granulocytes: 0.04 10*3/uL (ref 0.00–0.07)
Basophils Absolute: 0 10*3/uL (ref 0.0–0.1)
Basophils Relative: 0 %
Eosinophils Absolute: 0 10*3/uL (ref 0.0–0.5)
Eosinophils Relative: 0 %
HCT: 43.7 % (ref 36.0–46.0)
Hemoglobin: 14.5 g/dL (ref 12.0–15.0)
Immature Granulocytes: 1 %
Lymphocytes Relative: 10 %
Lymphs Abs: 0.8 10*3/uL (ref 0.7–4.0)
MCH: 29.1 pg (ref 26.0–34.0)
MCHC: 33.2 g/dL (ref 30.0–36.0)
MCV: 87.8 fL (ref 80.0–100.0)
Monocytes Absolute: 0.4 10*3/uL (ref 0.1–1.0)
Monocytes Relative: 5 %
Neutro Abs: 7 10*3/uL (ref 1.7–7.7)
Neutrophils Relative %: 84 %
Platelets: 167 10*3/uL (ref 150–400)
RBC: 4.98 MIL/uL (ref 3.87–5.11)
RDW: 12.5 % (ref 11.5–15.5)
WBC: 8.2 10*3/uL (ref 4.0–10.5)
nRBC: 0 % (ref 0.0–0.2)

## 2020-05-29 LAB — C-REACTIVE PROTEIN: CRP: 5.6 mg/dL — ABNORMAL HIGH (ref <1.0)

## 2020-05-29 LAB — COMPREHENSIVE METABOLIC PANEL
ALT: 42 U/L (ref 0–44)
AST: 38 U/L (ref 15–41)
Albumin: 2.6 g/dL — ABNORMAL LOW (ref 3.5–5.0)
Alkaline Phosphatase: 69 U/L (ref 38–126)
Anion gap: 12 (ref 5–15)
BUN: 17 mg/dL (ref 6–20)
CO2: 24 mmol/L (ref 22–32)
Calcium: 8.8 mg/dL — ABNORMAL LOW (ref 8.9–10.3)
Chloride: 99 mmol/L (ref 98–111)
Creatinine, Ser: 0.85 mg/dL (ref 0.44–1.00)
GFR calc Af Amer: >60 mL/min (ref >60)
GFR calc non Af Amer: >60 mL/min (ref >60)
Glucose, Bld: 237 mg/dL — ABNORMAL HIGH (ref 70–99)
Potassium: 3.8 mmol/L (ref 3.5–5.1)
Sodium: 135 mmol/L (ref 135–145)
Total Bilirubin: 0.8 mg/dL (ref 0.3–1.2)
Total Protein: 6.2 g/dL — ABNORMAL LOW (ref 6.5–8.1)

## 2020-05-29 LAB — MAGNESIUM: Magnesium: 2 mg/dL (ref 1.7–2.4)

## 2020-05-29 LAB — D-DIMER, QUANTITATIVE: D-Dimer, Quant: 1.01 ug/mL-FEU — ABNORMAL HIGH (ref 0.00–0.50)

## 2020-05-29 LAB — MRSA PCR SCREENING: MRSA by PCR: NEGATIVE

## 2020-05-29 LAB — GLUCOSE, CAPILLARY
Glucose-Capillary: 273 mg/dL — ABNORMAL HIGH (ref 70–99)
Glucose-Capillary: 217 mg/dL — ABNORMAL HIGH (ref 70–99)
Glucose-Capillary: 169 mg/dL — ABNORMAL HIGH (ref 70–99)
Glucose-Capillary: 173 mg/dL — ABNORMAL HIGH (ref 70–99)

## 2020-05-29 LAB — BRAIN NATRIURETIC PEPTIDE: B Natriuretic Peptide: 100.4 pg/mL — ABNORMAL HIGH (ref 0.0–100.0)

## 2020-05-29 LAB — CBG MONITORING, ED: Glucose-Capillary: 203 mg/dL — ABNORMAL HIGH (ref 70–99)

## 2020-05-29 LAB — FERRITIN: Ferritin: 859 ng/mL — ABNORMAL HIGH (ref 11–307)

## 2020-05-29 MED ORDER — INSULIN GLARGINE 100 UNIT/ML ~~LOC~~ SOLN
5.0000 [IU] | Freq: Every day | SUBCUTANEOUS | Status: DC
  Administered 2020-05-29 – 2020-05-31 (×3): 5 [IU] via SUBCUTANEOUS
  Filled 2020-05-29 (×3): qty 0.05

## 2020-05-29 MED ORDER — BENZONATATE 100 MG PO CAPS: 100.0000 mg | ORAL_CAPSULE | Freq: Three times a day (TID) | ORAL | Status: DC | PRN

## 2020-05-29 MED ORDER — INSULIN ASPART 100 UNIT/ML ~~LOC~~ SOLN
3.0000 [IU] | Freq: Three times a day (TID) | SUBCUTANEOUS | Status: DC
  Administered 2020-05-29 – 2020-05-30 (×5): 3 [IU] via SUBCUTANEOUS

## 2020-05-29 MED ORDER — SENNOSIDES-DOCUSATE SODIUM 8.6-50 MG PO TABS
2.0000 | ORAL_TABLET | Freq: Every evening | ORAL | Status: DC | PRN
  Administered 2020-06-01: 2 via ORAL
  Filled 2020-05-29: qty 2

## 2020-05-29 MED ORDER — BENZONATATE 100 MG PO CAPS
100.0000 mg | ORAL_CAPSULE | Freq: Three times a day (TID) | ORAL | Status: DC | PRN
  Administered 2020-05-30 – 2020-06-12 (×7): 100 mg via ORAL
  Filled 2020-05-29 (×8): qty 1

## 2020-05-29 MED ORDER — GUAIFENESIN-DM 100-10 MG/5ML PO SYRP
10.0000 mL | ORAL_SOLUTION | ORAL | Status: DC | PRN
  Administered 2020-05-30 – 2020-06-10 (×14): 10 mL via ORAL
  Filled 2020-05-29 (×16): qty 10

## 2020-05-29 MED ORDER — BARICITINIB 2 MG PO TABS
4.0000 mg | ORAL_TABLET | Freq: Every day | ORAL | Status: AC
  Administered 2020-05-29 – 2020-06-09 (×12): 4 mg via ORAL
  Filled 2020-05-29 (×12): qty 2

## 2020-05-29 NOTE — Telephone Encounter (Signed)
Called and spoke with patient's husband and he just wants the physician to be aware that she is in the hospital with [facility]. I advised that I would let the physician know and to call back if they needed anything further. Patient's husband verbalized understanding.

## 2020-05-29 NOTE — Progress Notes (Signed)
PROGRESS NOTE    Claudia Vega  LPF:[id number] DOB: July 17, 1961 DOA: 06/20/2020 PCP: Greig Right, MD   Brief Narrative:  59 year old with history of asthma, obesity was having symptomatic bronchitis 2 weeks ago but was diagnosed negative for COVID-19 at that time continue to have progressive shortness of breath therefore had repeat testing with PCP on 9/5.  Tested positive for COVID-19 and was sent to the hospital.  Chest x-ray showed bilateral infiltrate, she was hypoxic requiring 7 L nasal cannula.  Started on steroids and remdesivir.  Lantus and NovoLog added due to hyperglycemia.   Assessment & Plan:   Principal Problem:   Acute hypoxemic respiratory failure due to COVID-19 [facility]) Active Problems:   HTN (hypertension)   Asthma, allergic  Acute hypoxic respiratory failure secondary to COVID-19 pneumonia, tested + 06/15/2020 -Oxygen levels-15L HF, if worsens may need a CTA chest. Monitoring D dimer as well.  -Remdesivir-D 3 -Solu-Medrol-D 2 -Baricitinib-D 2 -procalcitonin-negative -Chest x-ray-bilateral infiltrate -Supportive care-antitussive, inhalers, I-S/flutter -BNP minimally elevated but will continue to watch volume status. -CODE STATUS confirmed -Vitamin C & Zinc. Prone >16hrs/day.  -Routine: Labs have been reviewed including ferritin, LDH, CRP, d-dimer, fibrinogen.  Will need to trend this lab daily.  Hyperglycemia -Secondary to steroid use.  Lantus 5 units daily, NovoLog 3 units 3 times daily.  Accu-Cheks and sliding scale  Essential hypertension -Cardizem 120 mg at bedtime, hydrochlorothiazide 12.5 mg daily.  History of asthma -As needed bronchodilators as above     DVT prophylaxis: Lovenox Code Status: Full code Family Communication:  Dan updated.   Status is: Inpatient  Remains inpatient appropriate because:Inpatient level of care appropriate due to severity of illness   Dispo: The patient is from: Home              Anticipated d/c is to: Home               Anticipated d/c date is: 2 days              Patient currently is not medically stable to d/c.  Patient is still hypoxic requiring greater than 7 L of oxygen.  Maintain hospital stay   Subjective: When seen and examined at bedside she denied any worsening or shortness of breath was very nauseous and was having coughing spells.  No other complaints.  Overnight her oxygen requirement trended up to 15 L high flow.  Review of Systems Otherwise negative except as per HPI, including: General = no fevers, chills, dizziness,  fatigue HEENT/EYES = negative for loss of vision, double vision, blurred vision,  sore throa Cardiovascular= negative for chest pain, palpitation Respiratory/lungs= negative for hemoptysis,  Gastrointestinal= negative for nausea Genitourinary= negative for Dysuria MSK = Negative for arthralgia, myalgias Neurology= Negative for headache, numbness, tingling  Psychiatry= Negative for suicidal and homocidal ideation Skin= Negative for Rash  Examination:  Constitutional: Not in acute distress; 15L HFNC Respiratory: b/l diffuse rhonchi.  Cardiovascular: Normal sinus rhythm, no rubs Abdomen: Nontender nondistended good bowel sounds Musculoskeletal: No edema noted Skin: No rashes seen Neurologic: CN 2-12 grossly intact.  And nonfocal Psychiatric: Normal judgment and insight. Alert and oriented x 3. Normal mood.   Objective: Vitals:   05/29/20 0400 05/29/20 0621 05/29/20 0800 05/29/20 1150  BP: 112/70 121/88 111/73 103/69  Pulse: 87 76  80  Resp: (!) 29 (!) 21 (!) 25 19  Temp:  98.2 F (36.8 C) 98.5 F (36.9 C) 97.6 F (36.4 C)  TempSrc:  Oral Oral Axillary  SpO2: [place] Kitchen)  86% 91%  94%    Intake/Output Summary (Last 24 hours) at 05/29/2020 1218 Last data filed at 05/29/2020 0840 Gross per 24 hour  Intake 0 ml  Output --  Net 0 ml   There were no vitals filed for this visit.   Data Reviewed:   CBC: Recent Labs  Lab 05/26/2020 2000 05/28/20 0400  05/29/20 0438  WBC 9.0 5.8 8.2  NEUTROABS 7.6 5.2 7.0  HGB 14.3 12.9 14.5  HCT 44.1 39.2 43.7  MCV 89.3 91.0 87.8  PLT 147* 144* 938   Basic Metabolic Panel: Recent Labs  Lab 06/19/2020 2000 05/28/20 0400 05/29/20 0438  NA 135 134* 135  K 3.8 4.0 3.8  CL 98 103 99  CO2 25 19* 24  GLUCOSE 202* 306* 237*  BUN 12 10 17   CREATININE 1.09* 0.95 0.85  CALCIUM 8.5* 7.6* 8.8*  MG  --   --  2.0   GFR: Estimated Creatinine Clearance: 69.9 mL/min (by C-G formula based on SCr of 0.85 mg/dL). Liver Function Tests: Recent Labs  Lab 06/02/2020 2000 05/28/20 0400 05/29/20 0438  AST 43* 41 38  ALT 48* 43 42  ALKPHOS 74 60 69  BILITOT 0.6 0.5 0.8  PROT 6.5 5.4* 6.2*  ALBUMIN 3.0* 2.4* 2.6*   No results for input(s): LIPASE, AMYLASE in the last 168 hours. No results for input(s): AMMONIA in the last 168 hours. Coagulation Profile: No results for input(s): INR, PROTIME in the last 168 hours. Cardiac Enzymes: No results for input(s): CKTOTAL, CKMB, CKMBINDEX, TROPONINI in the last 168 hours. BNP (last 3 results) No results for input(s): PROBNP in the last 8760 hours. HbA1C: Recent Labs    05/28/20 0400  HGBA1C 6.9*   CBG: Recent Labs  Lab 05/28/20 1307 05/28/20 1907 05/29/20 0246 05/29/20 0749 05/29/20 1149  GLUCAP 257* 234* 203* 273* 217*   Lipid Profile: Recent Labs    05/29/2020 2000  TRIG 223*   Thyroid Function Tests: No results for input(s): TSH, T4TOTAL, FREET4, T3FREE, THYROIDAB in the last 72 hours. Anemia Panel: Recent Labs    06/14/2020 2000 05/29/20 0438  FERRITIN 547* 859*   Sepsis Labs: Recent Labs  Lab 06/18/2020 2000 06/14/2020 2040  PROCALCITON <0.10  --   LATICACIDVEN 2.9* 2.4*    Recent Results (from the past 240 hour(s))  Blood Culture (routine x 2)     Status: None (Preliminary result)   Collection Time: 06/20/2020  8:01 PM   Specimen: BLOOD  Result Value Ref Range Status   Specimen Description BLOOD LEFT ANTECUBITAL  Final   Special  Requests   Final    BOTTLES DRAWN AEROBIC AND ANAEROBIC Blood Culture adequate volume   Culture   Final    NO GROWTH 2 DAYS Performed at [facility], [place] [address] [place], [place] [address]    Report Status PENDING  Incomplete  SARS Coronavirus 2 by RT PCR (hospital order, performed in [facility] hospital lab) Nasopharyngeal Nasopharyngeal Swab     Status: Abnormal   Collection Time: 05/30/2020  8:40 PM   Specimen: Nasopharyngeal Swab  Result Value Ref Range Status   SARS Coronavirus 2 POSITIVE (A) NEGATIVE Final    Comment: RESULT CALLED TO, READ BACK BY AND VERIFIED WITH: RN J SAWATZKI @2301  06/03/2020 BY S GEZAHEGN (NOTE) SARS-CoV-2 target nucleic acids are DETECTED  SARS-CoV-2 RNA is generally detectable in upper respiratory specimens  during the acute phase of infection.  Positive results are indicative  of the presence of  the identified virus, but do not rule out bacterial infection or co-infection with other pathogens not detected by the test.  Clinical correlation with patient history and  other diagnostic information is necessary to determine patient infection status.  The expected result is negative.  Fact Sheet for Patients:   StrictlyIdeas.no   Fact Sheet for Healthcare Providers:   BankingDealers.co.za    This test is not yet approved or cleared by the [place] FDA and  has been authorized for detection and/or diagnosis of SARS-CoV-2 by FDA under an Emergency Use Authorization (EUA).  This EUA will remain in effect (meaning t his test can be used) for the duration of  the COVID-19 declaration under Section 564(b)(1) of the Act, 21 U.S.C. section 360-bbb-3(b)(1), unless the authorization is terminated or revoked sooner.  Performed at [facility], [place] [address], [place], [place] [address]   Blood Culture (routine x 2)     Status: None (Preliminary result)   Collection Time: 06/05/2020  8:58 PM    Specimen: BLOOD  Result Value Ref Range Status   Specimen Description BLOOD RIGHT ANTECUBITAL  Final   Special Requests   Final    BOTTLES DRAWN AEROBIC AND ANAEROBIC Blood Culture adequate volume   Culture   Final    NO GROWTH 2 DAYS Performed at [facility], [place] [address] [place], [place] [address]    Report Status PENDING  Incomplete         Radiology Studies: DG Chest Port 1 View  Result Date: 06/05/2020 CLINICAL DATA:  Cough, COVID, hypoxia EXAM: PORTABLE CHEST 1 VIEW COMPARISON:  11/07/2018 FINDINGS: Lung volumes are small. Bibasilar asymmetric moderate pulmonary infiltrates are present in keeping with atypical infection and compatible with the given history of COVID-19 pneumonia. No pneumothorax or pleural effusion. Cardiac size within normal limits. Pulmonary vascularity is normal. No acute bone abnormality. IMPRESSION: Bibasilar asymmetric moderate pulmonary infiltrates compatible with the given history of COVID-19 pneumonia. Electronically Signed   By: Fidela Salisbury MD   On: 05/29/2020 20:23        Scheduled Meds: . vitamin C  500 mg Oral Daily  . baricitinib  4 mg Oral QHS  . buPROPion  150 mg Oral q AM  . diltiazem  120 mg Oral QHS  . enoxaparin (LOVENOX) injection  40 mg Subcutaneous Q24H  . hydrochlorothiazide  12.5 mg Oral Daily  . insulin aspart  0-15 Units Subcutaneous TID WC  . insulin aspart  0-5 Units Subcutaneous QHS  . insulin aspart  3 Units Subcutaneous TID WC  . insulin glargine  5 Units Subcutaneous Daily  . Ipratropium-Albuterol  1 puff Inhalation Q6H  . methylPREDNISolone (SOLU-MEDROL) injection  40 mg Intravenous Q12H  . mometasone-formoterol  2 puff Inhalation BID  . montelukast  10 mg Oral QHS  . pantoprazole  40 mg Oral Daily  . pramipexole  1 mg Oral QHS  . zinc sulfate  220 mg Oral Daily   Continuous Infusions: . remdesivir 100 mg in NS 100 mL 100 mg (05/29/20 0918)     LOS: 2 days   Time spent= 35 mins    Cecile Guevara Arsenio Loader, MD [facility]  If 7PM-7AM, please contact night-coverage  05/29/2020, 12:18 PM \

## 2020-05-29 NOTE — ED Notes (Signed)
Pt's O2 was reading low.  Pt sleeps w/ her mouth open at night.  Placed on non-rebreather, O2 improved to 94%.  Will continue to monitor

## 2020-05-29 NOTE — ED Notes (Signed)
Report given to Jessica RN

## 2020-05-29 NOTE — Telephone Encounter (Signed)
Patient husband called and said that his wife was admitted into the hospital Sunday with covid.

## 2020-05-29 NOTE — Progress Notes (Signed)
Inpatient Diabetes Program Recommendations  AACE/ADA: New Consensus Statement on Inpatient Glycemic Control (2015)  Target Ranges:  Prepandial:   less than 140 mg/dL      Peak postprandial:   less than 180 mg/dL (1-2 hours)      Critically ill patients:  140 - 180 mg/dL   Lab Results  Component Value Date   GLUCAP 217 (H) 05/29/2020   HGBA1C 6.9 (H) 05/28/2020    Review of Glycemic Control  Diabetes history: none  Current orders for Inpatient glycemic control:  Lantus 5 units Daily Novolog 0-15 units tid + hs Novolog 3 units tid meal coverage  Solumedrol 40 mg Q12 hours A1c 6.9%  Inpatient Diabetes Program Recommendations:    Lantus 5 units given first time this am.   Note A1c 6.9%. Called and spoke with pt. She has been on steroids for 3 weeks prior to A1c being drawn. Recommended for her to follow up with PCP 3-4 month after completion of steroids to get a true reading of glucose levels at baseline.  Thanks,  Tama Headings RN, MSN, BC-ADM Inpatient Diabetes Coordinator Team Pager [phone] (8a-5p)

## 2020-05-30 LAB — CBC WITH DIFFERENTIAL/PLATELET
Abs Immature Granulocytes: 0.04 10*3/uL (ref 0.00–0.07)
Basophils Absolute: 0 10*3/uL (ref 0.0–0.1)
Basophils Relative: 0 %
Eosinophils Absolute: 0 10*3/uL (ref 0.0–0.5)
Eosinophils Relative: 0 %
HCT: 44.3 % (ref 36.0–46.0)
Hemoglobin: 14.4 g/dL (ref 12.0–15.0)
Immature Granulocytes: 1 %
Lymphocytes Relative: 13 %
Lymphs Abs: 1.1 10*3/uL (ref 0.7–4.0)
MCH: 29 pg (ref 26.0–34.0)
MCHC: 32.5 g/dL (ref 30.0–36.0)
MCV: 89.3 fL (ref 80.0–100.0)
Monocytes Absolute: 0.5 10*3/uL (ref 0.1–1.0)
Monocytes Relative: 5 %
Neutro Abs: 6.9 10*3/uL (ref 1.7–7.7)
Neutrophils Relative %: 81 %
Platelets: 213 10*3/uL (ref 150–400)
RBC: 4.96 MIL/uL (ref 3.87–5.11)
RDW: 12.8 % (ref 11.5–15.5)
WBC: 8.5 10*3/uL (ref 4.0–10.5)
nRBC: 0 % (ref 0.0–0.2)

## 2020-05-30 LAB — C-REACTIVE PROTEIN: CRP: 1.6 mg/dL — ABNORMAL HIGH (ref <1.0)

## 2020-05-30 LAB — FERRITIN: Ferritin: 724 ng/mL — ABNORMAL HIGH (ref 11–307)

## 2020-05-30 LAB — COMPREHENSIVE METABOLIC PANEL
ALT: 37 U/L (ref 0–44)
AST: 36 U/L (ref 15–41)
Albumin: 2.9 g/dL — ABNORMAL LOW (ref 3.5–5.0)
Alkaline Phosphatase: 71 U/L (ref 38–126)
Anion gap: 10 (ref 5–15)
BUN: 24 mg/dL — ABNORMAL HIGH (ref 6–20)
CO2: 27 mmol/L (ref 22–32)
Calcium: 9 mg/dL (ref 8.9–10.3)
Chloride: 98 mmol/L (ref 98–111)
Creatinine, Ser: 1.01 mg/dL — ABNORMAL HIGH (ref 0.44–1.00)
GFR calc Af Amer: >60 mL/min (ref >60)
GFR calc non Af Amer: >60 mL/min (ref >60)
Glucose, Bld: 234 mg/dL — ABNORMAL HIGH (ref 70–99)
Potassium: 4.4 mmol/L (ref 3.5–5.1)
Sodium: 135 mmol/L (ref 135–145)
Total Bilirubin: 0.6 mg/dL (ref 0.3–1.2)
Total Protein: 6.4 g/dL — ABNORMAL LOW (ref 6.5–8.1)

## 2020-05-30 LAB — GLUCOSE, CAPILLARY
Glucose-Capillary: 242 mg/dL — ABNORMAL HIGH (ref 70–99)
Glucose-Capillary: 192 mg/dL — ABNORMAL HIGH (ref 70–99)
Glucose-Capillary: 178 mg/dL — ABNORMAL HIGH (ref 70–99)
Glucose-Capillary: 163 mg/dL — ABNORMAL HIGH (ref 70–99)

## 2020-05-30 LAB — MAGNESIUM: Magnesium: 2.3 mg/dL (ref 1.7–2.4)

## 2020-05-30 LAB — D-DIMER, QUANTITATIVE: D-Dimer, Quant: 0.96 ug/mL-FEU — ABNORMAL HIGH (ref 0.00–0.50)

## 2020-05-30 MED ORDER — ENOXAPARIN SODIUM 40 MG/0.4ML ~~LOC~~ SOLN
40.0000 mg | Freq: Two times a day (BID) | SUBCUTANEOUS | Status: DC
  Administered 2020-05-30 – 2020-06-08 (×18): 40 mg via SUBCUTANEOUS
  Filled 2020-05-30 (×18): qty 0.4

## 2020-05-30 MED ORDER — INSULIN ASPART 100 UNIT/ML ~~LOC~~ SOLN
4.0000 [IU] | Freq: Three times a day (TID) | SUBCUTANEOUS | Status: DC
  Administered 2020-05-30 – 2020-06-01 (×5): 4 [IU] via SUBCUTANEOUS

## 2020-05-30 MED ORDER — LINAGLIPTIN 5 MG PO TABS
5.0000 mg | ORAL_TABLET | Freq: Every day | ORAL | Status: DC
  Administered 2020-05-30 – 2020-06-15 (×17): 5 mg via ORAL
  Filled 2020-05-30 (×17): qty 1

## 2020-05-30 MED ORDER — METHYLPREDNISOLONE SODIUM SUCC 125 MG IJ SOLR
60.0000 mg | Freq: Two times a day (BID) | INTRAMUSCULAR | Status: DC
  Administered 2020-05-30 – 2020-05-31 (×3): 60 mg via INTRAVENOUS
  Filled 2020-05-30 (×3): qty 2

## 2020-05-30 MED ORDER — SALINE SPRAY 0.65 % NA SOLN
1.0000 | NASAL | Status: DC | PRN
  Administered 2020-05-30: 1 via NASAL
  Filled 2020-05-30: qty 44

## 2020-05-30 NOTE — Progress Notes (Signed)
PROGRESS NOTE  Claudia Vega  TIW:[id number] DOB: [birth year] DOA: 06/10/2020 PCP: Greig Right, MD   Brief Narrative: Claudia Vega is a 59 y.o. female with a history of obesity, HTN, and asthma who developed symptoms of bronchitis for 2 weeks and ultimately tested positive for covid-19 on 9/5, sent to the ED where CXR demonstrated bilateral infiltrates and she was hypoxic requiring 7L O2. Remdesivir and steroids were started, baricitinib added due to progressive hypoxia to 15L HFNC.   Assessment & Plan: Principal Problem:   Acute hypoxemic respiratory failure due to COVID-19 [facility]) Active Problems:   HTN (hypertension)   Asthma, allergic  Acute hypoxemic respiratory failure due to covid-19 pneumonia: SARS-CoV-2 PCR positive 9/5, not vaccinated. CRP showing significant response to antiinflammatory Tx.  - Continue remdesivir x5 days (9/5 - 9/9) - Continue steroids, augment dose this AM - Continue baricitinib, started 9/6, for 14 days or until hospital discharge. Monitor Cr, LFTs, differential. Keep on VTE ppx. Continue 4mg  dose, but may have to decrease 9/9 if Cr rises any further. PCT negative. - Encourage OOB, IS, FV, and awake proning if able - Continue airborne, contact precautions for 21 days from positive testing. - Monitor CMP and inflammatory markers - Lovenox up to intermediate dosing. - Encouraged to get vaccine after isolation period.   T2DM, steroid-induced hyperglycemia: HbA1c 6.9% indicates diabetes Dx, though this is in setting of some prolonged steroids.  - Continue basal-bolus insulin, some postprandial elevations primarily, will increase mealtime slightly and monitor - Add linagliptin  Asthma: No wheezing currently.  - Continue dulera, singulair, scheduled combivent and prn albuterol MDI. On steroids as above.   HTN:  - Continue HCTZ, diltiazem  Obesity: Estimated body mass index is 38.56 kg/m as calculated from the following:   Height as of 05/24/20: 4' 11.9"  (1.521 m).   Weight as of 05/24/20: 89.3 kg.  DVT prophylaxis: Lovenox Code Status: Full Family Communication: Husband by phone this PM Disposition Plan:  Status is: Inpatient  Remains inpatient appropriate because:Inpatient level of care appropriate due to severity of illness   Dispo: The patient is from: Home              Anticipated d/c is to: Home              Anticipated d/c date is: > 3 days              Patient currently is not medically stable to d/c.  Consultants:   None  Procedures:   None  Antimicrobials:  Remdesivir   Subjective: Feels shortness of breath is slightly improved from yesterday, still severe with any movement, constant, improved with rest and oxygen.  Objective: Vitals:   05/30/20 1300 05/30/20 1400 05/30/20 1421 05/30/20 1613  BP:    100/67  Pulse:   79 72  Resp:   (!) 24 17  Temp:   98.1 F (36.7 C) (!) 97.1 F (36.2 C)  TempSrc:    Axillary  SpO2: 92% 90% 90% 90%    Intake/Output Summary (Last 24 hours) at 05/30/2020 1633 Last data filed at 05/30/2020 1300 Gross per 24 hour  Intake 240 ml  Output --  Net 240 ml   There were no vitals filed for this visit.  Gen: 59 y.o. female in no distress  Pulm: Tachypneic with 15L HFNC + 15L NRB, crackles diffusely without wheezes.  CV: Regular rate and rhythm. No murmur, rub, or gallop. No JVD, no pedal edema. GI: Abdomen soft, non-tender,  non-distended, with normoactive bowel sounds. No organomegaly or masses felt. Ext: Warm, no deformities Skin: No rashes, lesions or ulcers Neuro: Alert and oriented. No focal neurological deficits. Psych: Judgement and insight appear normal. Mood & affect appropriate.   Data Reviewed: I have personally reviewed following labs and imaging studies  CBC: Recent Labs  Lab 06/16/2020 2000 05/28/20 0400 05/29/20 0438 05/30/20 0632  WBC 9.0 5.8 8.2 8.5  NEUTROABS 7.6 5.2 7.0 6.9  HGB 14.3 12.9 14.5 14.4  HCT 44.1 39.2 43.7 44.3  MCV 89.3 91.0 87.8 89.3   PLT 147* 144* 167 253   Basic Metabolic Panel: Recent Labs  Lab 06/02/2020 2000 05/28/20 0400 05/29/20 0438 05/30/20 0632  NA 135 134* 135 135  K 3.8 4.0 3.8 4.4  CL 98 103 99 98  CO2 25 19* 24 27  GLUCOSE 202* 306* 237* 234*  BUN 12 10 17  24*  CREATININE 1.09* 0.95 0.85 1.01*  CALCIUM 8.5* 7.6* 8.8* 9.0  MG  --   --  2.0 2.3   GFR: Estimated Creatinine Clearance: 58.8 mL/min (A) (by C-G formula based on SCr of 1.01 mg/dL (H)). Liver Function Tests: Recent Labs  Lab 05/25/2020 2000 05/28/20 0400 05/29/20 0438 05/30/20 0632  AST 43* 41 38 36  ALT 48* 43 42 37  ALKPHOS 74 60 69 71  BILITOT 0.6 0.5 0.8 0.6  PROT 6.5 5.4* 6.2* 6.4*  ALBUMIN 3.0* 2.4* 2.6* 2.9*   No results for input(s): LIPASE, AMYLASE in the last 168 hours. No results for input(s): AMMONIA in the last 168 hours. Coagulation Profile: No results for input(s): INR, PROTIME in the last 168 hours. Cardiac Enzymes: No results for input(s): CKTOTAL, CKMB, CKMBINDEX, TROPONINI in the last 168 hours. BNP (last 3 results) No results for input(s): PROBNP in the last 8760 hours. HbA1C: Recent Labs    05/28/20 0400  HGBA1C 6.9*   CBG: Recent Labs  Lab 05/29/20 1555 05/29/20 2031 05/30/20 0755 05/30/20 1213 05/30/20 1617  GLUCAP 169* 173* 242* 192* 178*   Lipid Profile: Recent Labs    06/17/2020 2000  TRIG 223*   Thyroid Function Tests: No results for input(s): TSH, T4TOTAL, FREET4, T3FREE, THYROIDAB in the last 72 hours. Anemia Panel: Recent Labs    05/29/20 0438 05/30/20 0632  FERRITIN 859* 724*   Urine analysis: No results found for: COLORURINE, APPEARANCEUR, LABSPEC, PHURINE, GLUCOSEU, HGBUR, BILIRUBINUR, KETONESUR, PROTEINUR, UROBILINOGEN, NITRITE, LEUKOCYTESUR Recent Results (from the past 240 hour(s))  Blood Culture (routine x 2)     Status: None (Preliminary result)   Collection Time: 05/26/2020  8:01 PM   Specimen: BLOOD  Result Value Ref Range Status   Specimen Description BLOOD  LEFT ANTECUBITAL  Final   Special Requests   Final    BOTTLES DRAWN AEROBIC AND ANAEROBIC Blood Culture adequate volume   Culture   Final    NO GROWTH 3 DAYS Performed at [facility], [place] [address], [place], [place] [address]    Report Status PENDING  Incomplete  SARS Coronavirus 2 by RT PCR (hospital order, performed in [facility] hospital lab) Nasopharyngeal Nasopharyngeal Swab     Status: Abnormal   Collection Time: 06/14/2020  8:40 PM   Specimen: Nasopharyngeal Swab  Result Value Ref Range Status   SARS Coronavirus 2 POSITIVE (A) NEGATIVE Final    Comment: RESULT CALLED TO, READ BACK BY AND VERIFIED WITH: RN J SAWATZKI @2301  06/07/2020 BY S GEZAHEGN (NOTE) SARS-CoV-2 target nucleic acids are DETECTED  SARS-CoV-2 RNA  is generally detectable in upper respiratory specimens  during the acute phase of infection.  Positive results are indicative  of the presence of the identified virus, but do not rule out bacterial infection or co-infection with other pathogens not detected by the test.  Clinical correlation with patient history and  other diagnostic information is necessary to determine patient infection status.  The expected result is negative.  Fact Sheet for Patients:   StrictlyIdeas.no   Fact Sheet for Healthcare Providers:   BankingDealers.co.za    This test is not yet approved or cleared by the [place] FDA and  has been authorized for detection and/or diagnosis of SARS-CoV-2 by FDA under an Emergency Use Authorization (EUA).  This EUA will remain in effect (meaning t his test can be used) for the duration of  the COVID-19 declaration under Section 564(b)(1) of the Act, 21 U.S.C. section 360-bbb-3(b)(1), unless the authorization is terminated or revoked sooner.  Performed at [facility], [place] [address], [place], [place] [address]   Blood Culture (routine x 2)     Status: None (Preliminary result)    Collection Time: 06/17/2020  8:58 PM   Specimen: BLOOD  Result Value Ref Range Status   Specimen Description BLOOD RIGHT ANTECUBITAL  Final   Special Requests   Final    BOTTLES DRAWN AEROBIC AND ANAEROBIC Blood Culture adequate volume   Culture   Final    NO GROWTH 3 DAYS Performed at [facility], [place] [address] [place],  [address]    Report Status PENDING  Incomplete  MRSA PCR Screening     Status: None   Collection Time: 05/29/20  6:35 PM   Specimen: Nasal Mucosa; Nasopharyngeal  Result Value Ref Range Status   MRSA by PCR NEGATIVE NEGATIVE Final    Comment:        The GeneXpert MRSA Assay (FDA approved for NASAL specimens only), is one component of a comprehensive MRSA colonization surveillance program. It is not intended to diagnose MRSA infection nor to guide or monitor treatment for MRSA infections. Performed at [facility], [place] [address], [place],  [address]       Radiology Studies: No results found.  Scheduled Meds: . vitamin C  500 mg Oral Daily  . baricitinib  4 mg Oral QHS  . buPROPion  150 mg Oral q AM  . diltiazem  120 mg Oral QHS  . enoxaparin (LOVENOX) injection  40 mg Subcutaneous Q24H  . hydrochlorothiazide  12.5 mg Oral Daily  . insulin aspart  0-15 Units Subcutaneous TID WC  . insulin aspart  0-5 Units Subcutaneous QHS  . insulin aspart  3 Units Subcutaneous TID WC  . insulin glargine  5 Units Subcutaneous Daily  . Ipratropium-Albuterol  1 puff Inhalation Q6H  . methylPREDNISolone (SOLU-MEDROL) injection  60 mg Intravenous Q12H  . mometasone-formoterol  2 puff Inhalation BID  . montelukast  10 mg Oral QHS  . pantoprazole  40 mg Oral Daily  . pramipexole  1 mg Oral QHS  . zinc sulfate  220 mg Oral Daily   Continuous Infusions: . remdesivir 100 mg in NS 100 mL 100 mg (05/30/20 0920)     LOS: 3 days   Time spent: 35 minutes.  Patrecia Pour, MD [facility] [email or web address] 05/30/2020, 4:33 PM

## 2020-05-30 NOTE — Telephone Encounter (Signed)
Noted. I will route to Dr. Neldon Mc to keep him in the loop. I am sure that she is getting good care in the hospital.  Salvatore Marvel, MD [facility]

## 2020-05-31 LAB — COMPREHENSIVE METABOLIC PANEL
ALT: 27 U/L (ref 0–44)
AST: 29 U/L (ref 15–41)
Albumin: 2.7 g/dL — ABNORMAL LOW (ref 3.5–5.0)
Alkaline Phosphatase: 70 U/L (ref 38–126)
Anion gap: 9 (ref 5–15)
BUN: 28 mg/dL — ABNORMAL HIGH (ref 6–20)
CO2: 27 mmol/L (ref 22–32)
Calcium: 8.5 mg/dL — ABNORMAL LOW (ref 8.9–10.3)
Chloride: 101 mmol/L (ref 98–111)
Creatinine, Ser: 0.9 mg/dL (ref 0.44–1.00)
GFR calc Af Amer: >60 mL/min (ref >60)
GFR calc non Af Amer: >60 mL/min (ref >60)
Glucose, Bld: 237 mg/dL — ABNORMAL HIGH (ref 70–99)
Potassium: 4.7 mmol/L (ref 3.5–5.1)
Sodium: 137 mmol/L (ref 135–145)
Total Bilirubin: 0.8 mg/dL (ref 0.3–1.2)
Total Protein: 5.7 g/dL — ABNORMAL LOW (ref 6.5–8.1)

## 2020-05-31 LAB — GLUCOSE, CAPILLARY
Glucose-Capillary: 243 mg/dL — ABNORMAL HIGH (ref 70–99)
Glucose-Capillary: 218 mg/dL — ABNORMAL HIGH (ref 70–99)
Glucose-Capillary: 226 mg/dL — ABNORMAL HIGH (ref 70–99)
Glucose-Capillary: 143 mg/dL — ABNORMAL HIGH (ref 70–99)

## 2020-05-31 LAB — C-REACTIVE PROTEIN: CRP: 0.6 mg/dL (ref <1.0)

## 2020-05-31 LAB — D-DIMER, QUANTITATIVE: D-Dimer, Quant: 0.99 ug/mL-FEU — ABNORMAL HIGH (ref 0.00–0.50)

## 2020-05-31 MED ORDER — INSULIN GLARGINE 100 UNIT/ML ~~LOC~~ SOLN
5.0000 [IU] | Freq: Two times a day (BID) | SUBCUTANEOUS | Status: DC
  Administered 2020-05-31 – 2020-06-02 (×4): 5 [IU] via SUBCUTANEOUS
  Filled 2020-05-31 (×5): qty 0.05

## 2020-05-31 MED ORDER — METHYLPREDNISOLONE SODIUM SUCC 40 MG IJ SOLR
40.0000 mg | Freq: Two times a day (BID) | INTRAMUSCULAR | Status: DC
  Administered 2020-05-31 – 2020-06-10 (×20): 40 mg via INTRAVENOUS
  Filled 2020-05-31 (×19): qty 1

## 2020-05-31 NOTE — Progress Notes (Signed)
PROGRESS NOTE  Claudia Vega  WHQ:[id number] DOB: 05-27-1961 DOA: 06/15/2020 PCP: Greig Right, MD   Brief Narrative: Claudia Vega is a 59 y.o. female with a history of obesity, HTN, and asthma who developed symptoms of bronchitis for 2 weeks and ultimately tested positive for covid-19 on 9/5, sent to the ED where CXR demonstrated bilateral infiltrates and she was hypoxic requiring 7L O2. Remdesivir and steroids were started, baricitinib added due to progressive hypoxia to 15L HFNC. Hypoxemia has stabilized, though continues to require HFNC + NRB.   Assessment & Plan: Principal Problem:   Acute hypoxemic respiratory failure due to COVID-19 [facility]) Active Problems:   HTN (hypertension)   Asthma, allergic  Acute hypoxemic respiratory failure due to covid-19 pneumonia: SARS-CoV-2 PCR positive 9/5, not vaccinated. CRP showing significant response to antiinflammatory Tx.  - Complete remdesivir (9/5 - 9/9) today - Continue steroids. With normalization of CRP and ongoing JAK2 inhibition, will decrease dose to 40mg  IV q12h. - Continue baricitinib, started 9/6, for 14 days or until hospital discharge. Monitor Cr, LFTs, differential. Keep on VTE ppx. PCT negative. - Encourage OOB, IS, FV, and awake proning if able - Continue airborne, contact precautions for 21 days from positive testing. - Monitor CMP and inflammatory markers - Continue lovenox 40mg  q12h - Encouraged to get vaccine after isolation period.   T2DM, steroid-induced hyperglycemia: HbA1c 6.9% indicates diabetes Dx, though this is in setting of prolonged steroids.  - Continue basal-bolus insulin, fasting elevation this AM. Will increase lantus to 5u BID, continue mealtime and SSI and newly added linagliptin.  Asthma: No wheezing currently.  - Continue dulera, singulair, scheduled combivent and prn albuterol MDI. On steroids as above.   HTN: BPs soft - Continue diltiazem, will hold low dose HCTZ for right now.  Obesity:  Estimated body mass index is 38.56 kg/m as calculated from the following:   Height as of 05/24/20: 4' 11.9" (1.521 m).   Weight as of 05/24/20: 89.3 kg.  DVT prophylaxis: Lovenox Code Status: Full Family Communication: Husband by phone daily Disposition Plan:  Status is: Inpatient  Remains inpatient appropriate because:Inpatient level of care appropriate due to severity of illness   Dispo: The patient is from: Home              Anticipated d/c is to: Home              Anticipated d/c date is: > 3 days              Patient currently is not medically stable to d/c.  Consultants:   None  Procedures:   None  Antimicrobials:  Remdesivir 9/5 - 9/9  Subjective: Got up with PT, walked to door and back to chair. Very short of breath with this, but happy to have done it. Feels shortness of breath is marginally improved from yesterday, though remains present, moderate at rest, constant, worse with exertion. Eating ok. No chest pain.  Objective: Vitals:   05/31/20 0421 05/31/20 0724 05/31/20 0933 05/31/20 1128  BP: (!) 109/59 103/67 (!) 109/58 104/62  Pulse: 65 71 80 78  Resp: (!) 25 (!) 28 (!) 22 (!) 21  Temp: 98.2 F (36.8 C) 98.1 F (36.7 C) 98 F (36.7 C) 98 F (36.7 C)  TempSrc: Axillary Axillary Axillary Axillary  SpO2: 97% (!) 87% (!) 88% 97%   Gen: 59 y.o. female in no distress Pulm: Tachypneic without accessory muscle use w/HFNC + NRB, crackles diffusely. CV: Regular rate and rhythm. No  murmur, rub, or gallop. No JVD, no dependent edema. GI: Abdomen soft, non-tender, non-distended, with normoactive bowel sounds.  Ext: Warm, no deformities Skin: No rashes, lesions or ulcers on visualized skin. Neuro: Alert and oriented. No focal neurological deficits. Psych: Judgement and insight appear fair. Mood euthymic & affect congruent. Behavior is appropriate.  Data Reviewed: I have personally reviewed following labs and imaging studies  CBC: Recent Labs  Lab 05/29/2020 2000  05/28/20 0400 05/29/20 0438 05/30/20 0632  WBC 9.0 5.8 8.2 8.5  NEUTROABS 7.6 5.2 7.0 6.9  HGB 14.3 12.9 14.5 14.4  HCT 44.1 39.2 43.7 44.3  MCV 89.3 91.0 87.8 89.3  PLT 147* 144* 167 818   Basic Metabolic Panel: Recent Labs  Lab 05/25/2020 2000 05/28/20 0400 05/29/20 0438 05/30/20 0632 05/31/20 0541  NA 135 134* 135 135 137  K 3.8 4.0 3.8 4.4 4.7  CL 98 103 99 98 101  CO2 25 19* 24 27 27   GLUCOSE 202* 306* 237* 234* 237*  BUN 12 10 17  24* 28*  CREATININE 1.09* 0.95 0.85 1.01* 0.90  CALCIUM 8.5* 7.6* 8.8* 9.0 8.5*  MG  --   --  2.0 2.3  --    GFR: Estimated Creatinine Clearance: 66 mL/min (by C-G formula based on SCr of 0.9 mg/dL). Liver Function Tests: Recent Labs  Lab 06/01/2020 2000 05/28/20 0400 05/29/20 0438 05/30/20 0632 05/31/20 0541  AST 43* 41 38 36 29  ALT 48* 43 42 37 27  ALKPHOS 74 60 69 71 70  BILITOT 0.6 0.5 0.8 0.6 0.8  PROT 6.5 5.4* 6.2* 6.4* 5.7*  ALBUMIN 3.0* 2.4* 2.6* 2.9* 2.7*   No results for input(s): LIPASE, AMYLASE in the last 168 hours. No results for input(s): AMMONIA in the last 168 hours. Coagulation Profile: No results for input(s): INR, PROTIME in the last 168 hours. Cardiac Enzymes: No results for input(s): CKTOTAL, CKMB, CKMBINDEX, TROPONINI in the last 168 hours. BNP (last 3 results) No results for input(s): PROBNP in the last 8760 hours. HbA1C: No results for input(s): HGBA1C in the last 72 hours. CBG: Recent Labs  Lab 05/30/20 1213 05/30/20 1617 05/30/20 2108 05/31/20 0729 05/31/20 1233  GLUCAP 192* 178* 163* 243* 218*   Lipid Profile: No results for input(s): CHOL, HDL, LDLCALC, TRIG, CHOLHDL, LDLDIRECT in the last 72 hours. Thyroid Function Tests: No results for input(s): TSH, T4TOTAL, FREET4, T3FREE, THYROIDAB in the last 72 hours. Anemia Panel: Recent Labs    05/29/20 0438 05/30/20 0632  FERRITIN 859* 724*   Urine analysis: No results found for: COLORURINE, APPEARANCEUR, LABSPEC, PHURINE, GLUCOSEU, HGBUR,  BILIRUBINUR, KETONESUR, PROTEINUR, UROBILINOGEN, NITRITE, LEUKOCYTESUR Recent Results (from the past 240 hour(s))  Blood Culture (routine x 2)     Status: None (Preliminary result)   Collection Time: 06/18/2020  8:01 PM   Specimen: BLOOD  Result Value Ref Range Status   Specimen Description BLOOD LEFT ANTECUBITAL  Final   Special Requests   Final    BOTTLES DRAWN AEROBIC AND ANAEROBIC Blood Culture adequate volume   Culture   Final    NO GROWTH 4 DAYS Performed at [facility], [place] [address] [place], [place] [address]    Report Status PENDING  Incomplete  SARS Coronavirus 2 by RT PCR (hospital order, performed in [facility] hospital lab) Nasopharyngeal Nasopharyngeal Swab     Status: Abnormal   Collection Time: 06/03/2020  8:40 PM   Specimen: Nasopharyngeal Swab  Result Value Ref Range Status   SARS Coronavirus 2  POSITIVE (A) NEGATIVE Final    Comment: RESULT CALLED TO, READ BACK BY AND VERIFIED WITH: RN J SAWATZKI @2301  06/20/2020 BY S GEZAHEGN (NOTE) SARS-CoV-2 target nucleic acids are DETECTED  SARS-CoV-2 RNA is generally detectable in upper respiratory specimens  during the acute phase of infection.  Positive results are indicative  of the presence of the identified virus, but do not rule out bacterial infection or co-infection with other pathogens not detected by the test.  Clinical correlation with patient history and  other diagnostic information is necessary to determine patient infection status.  The expected result is negative.  Fact Sheet for Patients:   StrictlyIdeas.no   Fact Sheet for Healthcare Providers:   BankingDealers.co.za    This test is not yet approved or cleared by the [place] FDA and  has been authorized for detection and/or diagnosis of SARS-CoV-2 by FDA under an Emergency Use Authorization (EUA).  This EUA will remain in effect (meaning t his test can be used) for the duration of  the COVID-19  declaration under Section 564(b)(1) of the Act, 21 U.S.C. section 360-bbb-3(b)(1), unless the authorization is terminated or revoked sooner.  Performed at [facility], [place] [address], [place], [place] [address]   Blood Culture (routine x 2)     Status: None (Preliminary result)   Collection Time: 06/01/2020  8:58 PM   Specimen: BLOOD  Result Value Ref Range Status   Specimen Description BLOOD RIGHT ANTECUBITAL  Final   Special Requests   Final    BOTTLES DRAWN AEROBIC AND ANAEROBIC Blood Culture adequate volume   Culture   Final    NO GROWTH 4 DAYS Performed at [facility], [place] [address] [place], [place] [address]    Report Status PENDING  Incomplete  MRSA PCR Screening     Status: None   Collection Time: 05/29/20  6:35 PM   Specimen: Nasal Mucosa; Nasopharyngeal  Result Value Ref Range Status   MRSA by PCR NEGATIVE NEGATIVE Final    Comment:        The GeneXpert MRSA Assay (FDA approved for NASAL specimens only), is one component of a comprehensive MRSA colonization surveillance program. It is not intended to diagnose MRSA infection nor to guide or monitor treatment for MRSA infections. Performed at [facility], [place] [address], [place], [place] [address]       Radiology Studies: No results found.  Scheduled Meds: . vitamin C  500 mg Oral Daily  . baricitinib  4 mg Oral QHS  . buPROPion  150 mg Oral q AM  . diltiazem  120 mg Oral QHS  . enoxaparin (LOVENOX) injection  40 mg Subcutaneous Q12H  . hydrochlorothiazide  12.5 mg Oral Daily  . insulin aspart  0-15 Units Subcutaneous TID WC  . insulin aspart  0-5 Units Subcutaneous QHS  . insulin aspart  4 Units Subcutaneous TID WC  . insulin glargine  5 Units Subcutaneous Daily  . Ipratropium-Albuterol  1 puff Inhalation Q6H  . linagliptin  5 mg Oral Daily  . methylPREDNISolone (SOLU-MEDROL) injection  60 mg Intravenous Q12H  . mometasone-formoterol  2 puff Inhalation BID  . montelukast  10 mg  Oral QHS  . pantoprazole  40 mg Oral Daily  . pramipexole  1 mg Oral QHS  . zinc sulfate  220 mg Oral Daily   Continuous Infusions:    LOS: 4 days   Time spent: 35 minutes.  Patrecia Pour, MD [facility] [email or web address]  05/31/2020, 1:52 PM

## 2020-05-31 NOTE — Evaluation (Signed)
Physical Therapy Evaluation Patient Details Name: Claudia Vega MRN: [id number] DOB: 1960/11/18 Today's Date: 05/31/2020   History of Present Illness  59 yo female presenting with shortness of breath. Tested COVID +. PMH including HTN and asthma.   Clinical Impression  Pt presents to PT with limited functional activity tolerance due to decr pulmonary function due to Covid. Needs PT to maximize independence and functional activity tolerance so pt can return home when medically improved.     Follow Up Recommendations No PT follow up    Equipment Recommendations  None recommended by PT    Recommendations for Other Services       Precautions / Restrictions Precautions Precautions: Other (comment) Precaution Comments: watch SpO2      Mobility  Bed Mobility               General bed mobility comments: Pt up in chair  Transfers Overall transfer level: Modified independent Equipment used: None                Ambulation/Gait Ambulation/Gait assistance: Supervision Gait Distance (Feet): 40 Feet (40' x 1, 30' x 1) Assistive device: None Gait Pattern/deviations: Step-through pattern;Decreased stride length Gait velocity: decr Gait velocity interpretation: 1.31 - 2.62 ft/sec, indicative of limited community ambulator General Gait Details: Assist for lines and to monitor vital signs for activity tolerance  Stairs            Wheelchair Mobility    Modified Rankin (Stroke Patients Only)       Balance Overall balance assessment: No apparent balance deficits (not formally assessed)                                           Pertinent Vitals/Pain Pain Assessment: No/denies pain    Home Living Family/patient expects to be discharged to:: Private residence Living Arrangements: Spouse/significant other;Children Available Help at Discharge: Family Type of Home: House Home Access: Stairs to enter   Technical brewer of Steps:  2 Home Layout: Two level;Able to live on main level with bedroom/bathroom Home Equipment: None      Prior Function Level of Independence: Independent         Comments: Works as Pharmacist, 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      Hand Dominance        Extremity/Trunk Assessment   Upper Extremity Assessment Upper Extremity Assessment: Defer to OT evaluation    Lower Extremity Assessment Lower Extremity Assessment: Overall WFL for tasks assessed       Communication   Communication: No difficulties  Cognition Arousal/Alertness: Awake/alert Behavior During Therapy: WFL for tasks assessed/performed Overall Cognitive Status: Within Functional Limits for tasks assessed                                        General Comments      Exercises     Assessment/Plan    PT Assessment Patient needs continued PT services  PT Problem List Decreased activity tolerance;Decreased mobility;Cardiopulmonary status limiting activity       PT Treatment Interventions Gait training;Functional mobility training;Therapeutic activities;Therapeutic exercise;Patient/family education    PT Goals (Current goals can be found in the Care Plan section)  Acute Rehab PT Goals Patient Stated Goal: return home PT Goal Formulation: With patient Time For Goal Achievement: 06/07/20 Potential to Achieve  Goals: Good    Frequency Min 3X/week   Barriers to discharge        Co-evaluation               AM-PAC PT "6 Clicks" Mobility  Outcome Measure Help needed turning from your back to your side while in a flat bed without using bedrails?: None Help needed moving from lying on your back to sitting on the side of a flat bed without using bedrails?: None Help needed moving to and from a bed to a chair (including a wheelchair)?: None Help needed standing up from a chair using your arms (e.g., wheelchair or bedside chair)?: None Help needed to walk in hospital room?: A Little Help needed climbing 3-5  steps with a railing? : A Little 6 Click Score: 22    End of Session Equipment Utilized During Treatment: Oxygen Activity Tolerance: Patient limited by fatigue;Treatment limited secondary to medical complications (Comment) (decr SpO2) Patient left: in chair;with call bell/phone within reach   PT Visit Diagnosis: Other abnormalities of gait and mobility (R26.89)    Time: 1100-1129 PT Time Calculation (min) (ACUTE ONLY): 29 min   Charges:   PT Evaluation $PT Eval Moderate Complexity: 1 Mod PT Treatments $Gait Training: 8-22 mins        [place] Pager [phone] Office [place] 05/31/2020, 1:41 PM

## 2020-05-31 NOTE — TOC Initial Note (Addendum)
Transition of Care [facility]) - Initial/Assessment Note    Patient Details  Name: Claudia Vega MRN: [id number] Date of Birth: February 25, 1961  Transition of Care [facility]) CM/SW Contact:    Verdell Carmine, RN Phone Number: 05/31/2020, 9:22 AM  Clinical Narrative:                 Patient in for Progression of shortness of breath COVID  tested negative 2 weeks ago, then positive  5th and was admitted. History of asthma, followed by Asthma and [place].  On oxygen and IV medications for COVID.  Will most likely need oxygen and potential for home health services. Still  A couple of days away from discharge.  06/02/2020 projected [facility] will follow for needs.    Expected Discharge Plan: [place] Barriers to Discharge: Continued Medical Work up   Patient Goals and CMS Choice        Expected Discharge Plan and Services Expected Discharge Plan: [place]   Discharge Planning Services: CM Consult   Living arrangements for the past 2 months: Single Family Home                                      Prior Living Arrangements/Services Living arrangements for the past 2 months: Single Family Home Lives with:: Spouse Patient language and need for interpreter reviewed:: Yes        Need for Family Participation in Patient Care: Yes (Comment) Care giver support system in place?: Yes (comment)   Criminal Activity/Legal Involvement Pertinent to Current Situation/Hospitalization: No - Comment as needed  Activities of Daily Living      Permission Sought/Granted      Share Information with NAME: Claudia Vega Husband           Emotional Assessment       Orientation: : Oriented to Self, Oriented to Place, Oriented to  Time, Oriented to Situation   Psych Involvement: No (comment)  Admission diagnosis:  Hypoxia [R09.02] Acute hypoxemic respiratory failure due to COVID-19 ([place]) [U07.1, J96.01] Pneumonia due to COVID-19 virus [U07.1,  J12.82] Patient Active Problem List   Diagnosis Date Noted  . Acute hypoxemic respiratory failure due to COVID-19 ([place]) 06/05/2020  . Complicated migraine 40/98/1191  . OSA (obstructive sleep apnea) 12/01/2019  . Excessive daytime sleepiness 12/01/2019  . Attention deficit disorder (ADD) in adult 12/01/2019  . HTN (hypertension) 02/02/2012  . AR (allergic rhinitis) 02/02/2012  . Restless leg syndrome 02/02/2012  . Adult BMI 36.0-36.9 kg/sq m 02/02/2012  . Asthma, allergic 02/02/2012  . GERD (gastroesophageal reflux disease) 02/02/2012   PCP:  Greig Right, MD Pharmacy:   [id number] - [place], [place] - [address] [place] [address] Phone: [phone] Fax: [phone]  [facility] [place], [place] [address] [place] [address] Phone: [phone] Fax: [phone]     Social Determinants of Health (SDOH) Interventions    Readmission Risk Interventions No flowsheet data found.

## 2020-05-31 NOTE — Progress Notes (Signed)
   05/31/20 0724  Assess: MEWS Score  Temp 98.1 F (36.7 C)  BP 103/67  Pulse Rate 71  ECG Heart Rate 72  Resp (!) 28  Level of Consciousness Alert  SpO2 (!) 87 %  O2 Device HFNC;Non-rebreather Mask  Patient Activity (if Appropriate) In chair  O2 Flow Rate (L/min) 15 L/min  Assess: MEWS Score  MEWS Temp 0  MEWS Systolic 0  MEWS Pulse 0  MEWS RR 2  MEWS LOC 0  MEWS Score 2  MEWS Score Color Yellow  Assess: if the MEWS score is Yellow or Red  Were vital signs taken at a resting state? Yes  Focused Assessment No change from prior assessment  Early Detection of Sepsis Score *See Row Information* Low  MEWS guidelines implemented *See Row Information* Yes  Treat  MEWS Interventions Administered scheduled meds/treatments  Take Vital Signs  Increase Vital Sign Frequency  Yellow: Q 2hr X 2 then Q 4hr X 2, if remains yellow, continue Q 4hrs  Escalate  MEWS: Escalate Yellow: discuss with charge nurse/RN and consider discussing with provider and RRT  Notify: Charge Nurse/RN  Name of Charge Nurse/RN Notified ELISA   Date Charge Nurse/RN Notified 05/31/20  Time Charge Nurse/RN Notified [phone]

## 2020-05-31 NOTE — Progress Notes (Signed)
Inpatient Diabetes Program Recommendations  AACE/ADA: New Consensus Statement on Inpatient Glycemic Control (2015)  Target Ranges:  Prepandial:   less than 140 mg/dL      Peak postprandial:   less than 180 mg/dL (1-2 hours)      Critically ill patients:  140 - 180 mg/dL   Lab Results  Component Value Date   GLUCAP 243 (H) 05/31/2020   HGBA1C 6.9 (H) 05/28/2020    Review of Glycemic Control Results for CARIANA, KARGE (MRN [id number]) as of 05/31/2020 10:22  Ref. Range 05/30/2020 07:55 05/30/2020 12:13 05/30/2020 16:17 05/30/2020 21:08 05/31/2020 07:29  Glucose-Capillary Latest Ref Range: 70 - 99 mg/dL 242 (H) 192 (H) 178 (H) 163 (H) 243 (H)   Diabetes history: none  Current orders for Inpatient glycemic control:  Lantus 5 units Daily Novolog 0-15 units tid + hs Novolog 4 units tid meal coverage Tradjenta 5 mg Daily  Solumedrol 60 mg Q12 hours A1c 6.9%  Inpatient Diabetes Program Recommendations:    Fasting glucose 240's. If steroids remain the same consider: - increase Lantus to 10 units.   Thanks,  Tama Headings RN, MSN, BC-ADM Inpatient Diabetes Coordinator Team Pager [phone] (8a-5p)

## 2020-05-31 NOTE — Evaluation (Addendum)
Occupational Therapy Evaluation Patient Details Name: Claudia Vega MRN: [id number] DOB: [birth year] Today's Date: 05/31/2020    History of Present Illness 59 yo female presenting with shortness of breath. Tested COVID +. PMH including HTN and asthma.    Clinical Impression   PTA, pt was living with her husband and was independent and working as a Pharmacist, hospital. Pt currently requiring Supervision for ADLs and functional mobility. Presenting with poor activity tolerance and requiring rest breaks. SpO2 dropping to 85-79% on 15L via NRB with RR elevating to 30-40s. With seated rest break, pt recovering to >88%. Once seated in recliner, placed back on 15L via HFNC and 15L via NRB. Pt would benefit from further acute OT to facilitate safe dc. Recommend dc to home with HHOT for further OT to optimize safety, independence with ADLs, and return to PLOF.   SpO2 >90% at rest on 15L HFNC and 15L NRB. HR 60s. RR 20s. During mobility, SpO2 dropping to 80% on 15L NRB. able to recover with seated rest break. HR 60-80s. RR 30-40.    Follow Up Recommendations  Home health OT    Equipment Recommendations  3 in 1 bedside commode    Recommendations for Other Services PT consult     Precautions / Restrictions Precautions Precautions: Other (comment) Precaution Comments: watch SpO2      Mobility Bed Mobility               General bed mobility comments: Pt up in chair  Transfers Overall transfer level: Needs assistance Equipment used: None Transfers: Sit to/from Stand Sit to Stand: Supervision         General transfer comment: Supervision for safety    Balance Overall balance assessment: No apparent balance deficits (not formally assessed)                                         ADL either performed or assessed with clinical judgement   ADL Overall ADL's : Needs assistance/impaired                                       General ADL Comments: Pt  performing ADLs at a Supervision level. Requiring increased time and rest breaks     Vision         Perception     Praxis      Pertinent Vitals/Pain Pain Assessment: No/denies pain     Hand Dominance     Extremity/Trunk Assessment Upper Extremity Assessment Upper Extremity Assessment: Overall WFL for tasks assessed   Lower Extremity Assessment Lower Extremity Assessment: Defer to PT evaluation       Communication Communication Communication: No difficulties   Cognition Arousal/Alertness: Awake/alert Behavior During Therapy: WFL for tasks assessed/performed Overall Cognitive Status: Within Functional Limits for tasks assessed                                     General Comments  SpO2 >90% at rest on 15L HFNC and 15L NRB. Able to maintain SpO2 >85% on 15 via NRB.  During mobility, SpO2 dropping to 80% on 15L NRB. able to recover with seated rest break. HR 60-80s. RR 30-40s during mobility.    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 Enjoy painting and dancing        OT Problem List: Decreased strength;Decreased activity tolerance;Impaired balance (sitting and/or standing);Decreased knowledge of precautions;Cardiopulmonary status limiting activity      OT Treatment/Interventions: Self-care/ADL training;Therapeutic exercise;Energy conservation;DME and/or AE instruction;Therapeutic  activities;Patient/family education    OT Goals(Current goals can be found in the care plan section) Acute Rehab OT Goals Patient Stated Goal: return home OT Goal Formulation: With patient Time For Goal Achievement: 06/14/20 Potential to Achieve Goals: Good  OT Frequency: Min 2X/week   Barriers to D/C:            Co-evaluation              AM-PAC OT "6 Clicks" Daily Activity     Outcome Measure Help from another person eating meals?: A Little Help from another person taking care of personal grooming?: A Little Help from another person toileting, which includes using toliet, bedpan, or urinal?: A Lot Help from another person bathing (including washing, rinsing, drying)?: A Lot Help from another person to put on and taking off regular upper body clothing?: A Lot Help from another person to put on and taking off regular lower body clothing?: A Lot 6 Click Score: 14   End of Session Equipment Utilized During Treatment: Oxygen Nurse Communication: Mobility status  Activity Tolerance: Patient limited by fatigue Patient left: in chair;with call bell/phone within reach  OT Visit Diagnosis: Unsteadiness on feet (R26.81);Other abnormalities of gait and mobility (R26.89);Muscle weakness (generalized) (M62.81)                Time: 7741-2878 OT Time Calculation (min): 21 min Charges:  OT General Charges $OT Visit: 1 Visit OT Evaluation $OT Eval Moderate Complexity: [place], OTR/L Acute Rehab Pager: [phone] Office: [place] 05/31/2020, 5:32 PM

## 2020-06-01 LAB — CULTURE, BLOOD (ROUTINE X 2)
Culture: NO GROWTH
Culture: NO GROWTH
Special Requests: ADEQUATE
Special Requests: ADEQUATE

## 2020-06-01 LAB — GLUCOSE, CAPILLARY
Glucose-Capillary: 199 mg/dL — ABNORMAL HIGH (ref 70–99)
Glucose-Capillary: 206 mg/dL — ABNORMAL HIGH (ref 70–99)
Glucose-Capillary: 204 mg/dL — ABNORMAL HIGH (ref 70–99)
Glucose-Capillary: 215 mg/dL — ABNORMAL HIGH (ref 70–99)

## 2020-06-01 MED ORDER — INSULIN ASPART 100 UNIT/ML ~~LOC~~ SOLN
5.0000 [IU] | Freq: Three times a day (TID) | SUBCUTANEOUS | Status: DC
  Administered 2020-06-01 – 2020-06-15 (×35): 5 [IU] via SUBCUTANEOUS

## 2020-06-01 MED ORDER — OXYMETAZOLINE HCL 0.05 % NA SOLN
1.0000 | Freq: Two times a day (BID) | NASAL | Status: AC
  Administered 2020-06-01 – 2020-06-03 (×6): 1 via NASAL
  Filled 2020-06-01: qty 30

## 2020-06-01 NOTE — TOC Progression Note (Signed)
Transition of Care [facility]) - Progression Note    Patient Details  Name: LAYALI FREUND MRN: [id number] Date of Birth: 07-14-1961  Transition of [facility]) CM/SW [place], RN Phone Number: 06/01/2020, 3:36 PM  Clinical Narrative:    Continues to require high level of HF o2 as well as mask.  No current discharge date as of yet. Looking for improvements in oxygenation   Expected Discharge Plan: [place] Barriers to Discharge: Continued Medical Work up  Expected Discharge Plan and Services Expected Discharge Plan: [place]   Discharge Planning Services: CM Consult   Living arrangements for the past 2 months: Single Family Home                                       Social Determinants of Health (SDOH) Interventions    Readmission Risk Interventions No flowsheet data found.

## 2020-06-01 NOTE — Progress Notes (Signed)
PROGRESS NOTE  Claudia Vega  YQM:[id number] DOB: [birth year] DOA: 06/01/2020 PCP: Greig Right, MD   Brief Narrative: Claudia Vega is a 60 y.o. female with a history of obesity, HTN, and asthma who developed symptoms of bronchitis for 2 weeks and ultimately tested positive for covid-19 on 9/5, sent to the ED where CXR demonstrated bilateral infiltrates and she was hypoxic requiring 7L O2. Remdesivir and steroids were started, baricitinib added due to progressive hypoxia to 15L HFNC. Hypoxemia has stabilized, though continues to require HFNC + NRB.   Assessment & Plan: Principal Problem:   Acute hypoxemic respiratory failure due to COVID-19 [facility]) Active Problems:   HTN (hypertension)   Asthma, allergic  Acute hypoxemic respiratory failure due to covid-19 pneumonia: SARS-CoV-2 PCR positive 9/5, not vaccinated. CRP normalized  - Completed remdesivir (9/5 - 9/9) - Continue steroids. With normalization of CRP and ongoing JAK2 inhibition, dose decreased to 40mg  IV q12h. - Continue baricitinib, started 9/6, for 14 days or until hospital discharge. Monitor Cr, LFTs, differential. Keep on VTE ppx. PCT negative. - Encourage OOB, IS, FV, and awake proning if able - Continue airborne, contact precautions for 21 days from positive testing. - Monitor CMP and inflammatory markers - Continue lovenox 40mg  q12h - Encouraged to get vaccine after isolation period.  - Again encouraged OOB, IS, FV. Will give afrin x3 days in addition to nasal saline for decongestion.  T2DM, steroid-induced hyperglycemia: HbA1c 6.9% indicates diabetes Dx, though this is in setting of prolonged steroids.  - Continue basal-bolus insulin, fasting improved with increased basal dose, continue 5u BID. Increase mealtime for mostly postprandial elevations, continue SSI, linagliptin.    Asthma: No wheezing currently.  - Continue dulera, singulair, scheduled combivent and prn albuterol MDI. On steroids as above.   HTN: BPs  soft - Continue diltiazem, holding low dose HCTZ for right now.  Obesity: Estimated body mass index is 38.56 kg/m as calculated from the following:   Height as of 05/24/20: 4' 11.9" (1.521 m).   Weight as of 05/24/20: 89.3 kg.  DVT prophylaxis: Lovenox Code Status: Full Family Communication: Husband by phone daily Disposition Plan:  Status is: Inpatient  Remains inpatient appropriate because:Inpatient level of care appropriate due to severity of illness   Dispo: The patient is from: Home              Anticipated d/c is to: Home              Anticipated d/c date is: > 3 days              Patient currently is not medically stable to d/c.  Consultants:   None  Procedures:   None  Antimicrobials:  Remdesivir 9/5 - 9/9  Subjective: Willing to get up again a couple times today, but her bottom began hurting in the chair yesterday. Had trouble breathing last night and increasing nasal congestion. Feels about the same today. No chest pain.  Objective: Vitals:   05/31/20 2353 06/01/20 0431 06/01/20 0433 06/01/20 0802  BP: 95/66 (!) 97/43 106/65   Pulse: 80 61 62   Resp: 15 (!) 22 20   Temp: 98 F (36.7 C) 98.2 F (36.8 C) 98.2 F (36.8 C) 97.7 F (36.5 C)  TempSrc: Axillary  Axillary Axillary  SpO2: (!) 87% 91% 90%    Gen: 59 y.o. female in no distress Pulm: Nonlabored tachypnea with SpO2 in 80%'s on 15 + 15L. Crackles unchanged diffusely. CV: Regular rate and rhythm. No murmur, rub,  or gallop. No JVD, no dependent edema. GI: Abdomen soft, non-tender, non-distended, with normoactive bowel sounds.  Ext: Warm, no deformities Skin: No rashes, lesions or ulcers on visualized skin. Neuro: Alert and oriented. No focal neurological deficits. Psych: Judgement and insight appear fair. Mood euthymic & affect congruent. Behavior is appropriate.    Data Reviewed: I have personally reviewed following labs and imaging studies  CBC: Recent Labs  Lab 06/11/2020 2000 05/28/20 0400  05/29/20 0438 05/30/20 0632  WBC 9.0 5.8 8.2 8.5  NEUTROABS 7.6 5.2 7.0 6.9  HGB 14.3 12.9 14.5 14.4  HCT 44.1 39.2 43.7 44.3  MCV 89.3 91.0 87.8 89.3  PLT 147* 144* 167 825   Basic Metabolic Panel: Recent Labs  Lab 06/17/2020 2000 05/28/20 0400 05/29/20 0438 05/30/20 0632 05/31/20 0541  NA 135 134* 135 135 137  K 3.8 4.0 3.8 4.4 4.7  CL 98 103 99 98 101  CO2 25 19* 24 27 27   GLUCOSE 202* 306* 237* 234* 237*  BUN 12 10 17  24* 28*  CREATININE 1.09* 0.95 0.85 1.01* 0.90  CALCIUM 8.5* 7.6* 8.8* 9.0 8.5*  MG  --   --  2.0 2.3  --    GFR: Estimated Creatinine Clearance: 66 mL/min (by C-G formula based on SCr of 0.9 mg/dL). Liver Function Tests: Recent Labs  Lab 06/19/2020 2000 05/28/20 0400 05/29/20 0438 05/30/20 0632 05/31/20 0541  AST 43* 41 38 36 29  ALT 48* 43 42 37 27  ALKPHOS 74 60 69 71 70  BILITOT 0.6 0.5 0.8 0.6 0.8  PROT 6.5 5.4* 6.2* 6.4* 5.7*  ALBUMIN 3.0* 2.4* 2.6* 2.9* 2.7*   No results for input(s): LIPASE, AMYLASE in the last 168 hours. No results for input(s): AMMONIA in the last 168 hours. Coagulation Profile: No results for input(s): INR, PROTIME in the last 168 hours. Cardiac Enzymes: No results for input(s): CKTOTAL, CKMB, CKMBINDEX, TROPONINI in the last 168 hours. BNP (last 3 results) No results for input(s): PROBNP in the last 8760 hours. HbA1C: No results for input(s): HGBA1C in the last 72 hours. CBG: Recent Labs  Lab 05/31/20 0729 05/31/20 1233 05/31/20 1628 05/31/20 2040 06/01/20 0731  GLUCAP 243* 218* 226* 143* 199*   Lipid Profile: No results for input(s): CHOL, HDL, LDLCALC, TRIG, CHOLHDL, LDLDIRECT in the last 72 hours. Thyroid Function Tests: No results for input(s): TSH, T4TOTAL, FREET4, T3FREE, THYROIDAB in the last 72 hours. Anemia Panel: Recent Labs    05/30/20 0539  FERRITIN 724*   Urine analysis: No results found for: COLORURINE, APPEARANCEUR, LABSPEC, PHURINE, GLUCOSEU, HGBUR, BILIRUBINUR, KETONESUR, PROTEINUR,  UROBILINOGEN, NITRITE, LEUKOCYTESUR Recent Results (from the past 240 hour(s))  Blood Culture (routine x 2)     Status: None   Collection Time: 06/02/2020  8:01 PM   Specimen: BLOOD  Result Value Ref Range Status   Specimen Description BLOOD LEFT ANTECUBITAL  Final   Special Requests   Final    BOTTLES DRAWN AEROBIC AND ANAEROBIC Blood Culture adequate volume   Culture   Final    NO GROWTH 5 DAYS Performed at [facility], [address], [place],  [address]    Report Status 06/01/2020 FINAL  Final  SARS Coronavirus 2 by RT PCR (hospital order, performed in [facility] hospital lab) Nasopharyngeal Nasopharyngeal Swab     Status: Abnormal   Collection Time: 06/13/2020  8:40 PM   Specimen: Nasopharyngeal Swab  Result Value Ref Range Status   SARS Coronavirus 2 POSITIVE (A) NEGATIVE Final  Comment: RESULT CALLED TO, READ BACK BY AND VERIFIED WITH: RN J SAWATZKI @2301  05/25/2020 BY S GEZAHEGN (NOTE) SARS-CoV-2 target nucleic acids are DETECTED  SARS-CoV-2 RNA is generally detectable in upper respiratory specimens  during the acute phase of infection.  Positive results are indicative  of the presence of the identified virus, but do not rule out bacterial infection or co-infection with other pathogens not detected by the test.  Clinical correlation with patient history and  other diagnostic information is necessary to determine patient infection status.  The expected result is negative.  Fact Sheet for Patients:   StrictlyIdeas.no   Fact Sheet for Healthcare Providers:   BankingDealers.co.za    This test is not yet approved or cleared by the [place] FDA and  has been authorized for detection and/or diagnosis of SARS-CoV-2 by FDA under an Emergency Use Authorization (EUA).  This EUA will remain in effect (meaning t his test can be used) for the duration of  the COVID-19 declaration under Section 564(b)(1) of the Act,  21 U.S.C. section 360-bbb-3(b)(1), unless the authorization is terminated or revoked sooner.  Performed at [facility], [place] [address] [place], [place] [address]   Blood Culture (routine x 2)     Status: None   Collection Time: 05/26/2020  8:58 PM   Specimen: BLOOD  Result Value Ref Range Status   Specimen Description BLOOD RIGHT ANTECUBITAL  Final   Special Requests   Final    BOTTLES DRAWN AEROBIC AND ANAEROBIC Blood Culture adequate volume   Culture   Final    NO GROWTH 5 DAYS Performed at [facility], [place] [address], [place], [place] [address]    Report Status 06/01/2020 FINAL  Final  MRSA PCR Screening     Status: None   Collection Time: 05/29/20  6:35 PM   Specimen: Nasal Mucosa; Nasopharyngeal  Result Value Ref Range Status   MRSA by PCR NEGATIVE NEGATIVE Final    Comment:        The GeneXpert MRSA Assay (FDA approved for NASAL specimens only), is one component of a comprehensive MRSA colonization surveillance program. It is not intended to diagnose MRSA infection nor to guide or monitor treatment for MRSA infections. Performed at [facility], [place] [address], [place], [place] [address]       Radiology Studies: No results found.  Scheduled Meds: . vitamin C  500 mg Oral Daily  . baricitinib  4 mg Oral QHS  . buPROPion  150 mg Oral q AM  . diltiazem  120 mg Oral QHS  . enoxaparin (LOVENOX) injection  40 mg Subcutaneous Q12H  . insulin aspart  0-15 Units Subcutaneous TID WC  . insulin aspart  0-5 Units Subcutaneous QHS  . insulin aspart  4 Units Subcutaneous TID WC  . insulin glargine  5 Units Subcutaneous BID  . Ipratropium-Albuterol  1 puff Inhalation Q6H  . linagliptin  5 mg Oral Daily  . methylPREDNISolone (SOLU-MEDROL) injection  40 mg Intravenous Q12H  . mometasone-formoterol  2 puff Inhalation BID  . montelukast  10 mg Oral QHS  . pantoprazole  40 mg Oral Daily  . pramipexole  1 mg Oral QHS  . zinc sulfate  220 mg Oral Daily    Continuous Infusions:    LOS: 5 days   Time spent: 35 minutes.  Patrecia Pour, MD [facility] [email or web address] 06/01/2020, 8:48 AM

## 2020-06-02 LAB — COMPREHENSIVE METABOLIC PANEL
ALT: 29 U/L (ref 0–44)
AST: 23 U/L (ref 15–41)
Albumin: 2.7 g/dL — ABNORMAL LOW (ref 3.5–5.0)
Alkaline Phosphatase: 79 U/L (ref 38–126)
Anion gap: 8 (ref 5–15)
BUN: 23 mg/dL — ABNORMAL HIGH (ref 6–20)
CO2: 27 mmol/L (ref 22–32)
Calcium: 8.5 mg/dL — ABNORMAL LOW (ref 8.9–10.3)
Chloride: 101 mmol/L (ref 98–111)
Creatinine, Ser: 0.85 mg/dL (ref 0.44–1.00)
GFR calc Af Amer: >60 mL/min (ref >60)
GFR calc non Af Amer: >60 mL/min (ref >60)
Glucose, Bld: 227 mg/dL — ABNORMAL HIGH (ref 70–99)
Potassium: 4.7 mmol/L (ref 3.5–5.1)
Sodium: 136 mmol/L (ref 135–145)
Total Bilirubin: 1 mg/dL (ref 0.3–1.2)
Total Protein: 5.8 g/dL — ABNORMAL LOW (ref 6.5–8.1)

## 2020-06-02 LAB — GLUCOSE, CAPILLARY
Glucose-Capillary: 224 mg/dL — ABNORMAL HIGH (ref 70–99)
Glucose-Capillary: 244 mg/dL — ABNORMAL HIGH (ref 70–99)
Glucose-Capillary: 201 mg/dL — ABNORMAL HIGH (ref 70–99)
Glucose-Capillary: 210 mg/dL — ABNORMAL HIGH (ref 70–99)

## 2020-06-02 LAB — D-DIMER, QUANTITATIVE: D-Dimer, Quant: 1.08 ug/mL-FEU — ABNORMAL HIGH (ref 0.00–0.50)

## 2020-06-02 MED ORDER — INSULIN GLARGINE 100 UNIT/ML ~~LOC~~ SOLN
10.0000 [IU] | Freq: Two times a day (BID) | SUBCUTANEOUS | Status: DC
  Administered 2020-06-02 – 2020-06-07 (×10): 10 [IU] via SUBCUTANEOUS
  Filled 2020-06-02 (×12): qty 0.1

## 2020-06-02 NOTE — Progress Notes (Signed)
PROGRESS NOTE                                                                                                                                                                                                             Patient Demographics:    Claudia Vega, is a 59 y.o. female, DOB - Dec 30, 1960, OEV:[id number]  Outpatient Primary MD for the patient is Greig Right, MD   Admit date - 05/26/2020   LOS - 6  Chief Complaint  Patient presents with  . Shortness of Breath  . Cough       Brief Narrative: Patient is a 59 y.o. female with PMHx of HTN, asthma-who tested positive for COVID-19 on 9/5-admitted for worsening shortness of breath due to acute hypoxic respiratory failure requiring HFNC secondary to COVID-19 pneumonia  COVID-19 vaccinated status: Unvaccinated  Significant Events: 9/5>> Admit to [facility] for hypoxia due to COVID-19  Significant studies: 9/5>>Chest x-ray: Patchy bilateral infiltrates  COVID-19 medications: Steroids:9/5>> Remdesivir:9/5>>9/9 Baricitinib:9/5>>  Antibiotics: None  Microbiology data: 9/5>>Blood culture:no growth  Procedures: None  Consults: None  DVT prophylaxis: Prophylactic Lovenox at intermediate-twice daily dosing.   Subjective:    Lynnley Doddridge today feels essentially unchanged compared to yesterday-remains on HFNC and NRB.  Does acknowledge shortness of breath with minimal activity.   Assessment  & Plan :   Acute Hypoxic Resp Failure due to Covid 19 Viral pneumonia: Continues to have severe hypoxemia-on 15 L HFNC and NRB.  Looks comfortable.  No signs of volume overload on exam.  Continue steroids and baricitinib.  Has already completed a course of remdesivir.  Monitor closely-if she deteriorates significantly-she will need to be transferred to the ICU.  Fever: afebrile O2 requirements:  SpO2: (!) 85 % O2 Flow Rate (L/min): 15 L/min FiO2 (%): 100 %   COVID-19  Labs: Recent Labs    05/31/20 0541 05/31/20 1116 06/02/20 0240  DDIMER  --  0.99* 1.08*  CRP 0.6  --   --        Component Value Date/Time   BNP 100.4 (H) 05/29/2020 0438    Recent Labs  Lab 06/16/2020 2000  PROCALCITON <0.10    Lab Results  Component Value Date   SARSCOV2NAA POSITIVE (A) 05/29/2020     Prone/Incentive Spirometry: encouraged patient to lie prone for 3-4 hours at a time for a total  of 16 hours a day, and to encourage incentive spirometry use 3-4/hour.  DM-2 (A1c 6.9 on 9/6) with steroid-induced hyperglycemia: CBGs relatively stable but still on the higher side-increase Lantus to 10 units twice daily, continue 5 units of NovoLog with meals and SSI.  Follow and adjust accordingly  Recent Labs    06/01/20 1710 06/01/20 2044 06/02/20 0816  GLUCAP 204* 215* 224*    HTN: BP trolled-continue Cardizem  Bronchial asthma: No wheezing-stable-continue inhaler regimen  Morbid Obesity: Estimated body mass index is 38.56 kg/m as calculated from the following:   Height as of 05/24/20: 4' 11.9" (1.521 m).   Weight as of 05/24/20: 89.3 kg.    GI prophylaxis: PPI  ABG: No results found for: PHART, PCO2ART, PO2ART, HCO3, TCO2, ACIDBASEDEF, O2SAT  Vent Settings: N/A  Condition - Extremely Guarded  Family Communication  :  Spouse Maturin [phone])  updated over the phone 9/11  Code Status :  Full Code  Diet :  Diet Order            Diet Carb Modified Fluid consistency: Thin; Room service appropriate? Yes  Diet effective now                  Disposition Plan  :   Status is: Inpatient  Remains inpatient appropriate because:Inpatient level of care appropriate due to severity of illness   Dispo: The patient is from: Home              Anticipated d/c is to: Home              Anticipated d/c date is: > 3 days              Patient currently is not medically stable to d/c.   Barriers to discharge: Hypoxia requiring O2 supplementation/complete 5 days  of IV Remdesivir  Antimicorbials  :    Anti-infectives (From admission, onward)   Start     Dose/Rate Route Frequency Ordered Stop   05/28/20 1000  remdesivir 100 mg in sodium chloride 0.9 % 100 mL IVPB       "Followed by" Linked Group Details   100 mg 200 mL/hr over 30 Minutes Intravenous Every 24 hours 05/26/2020 2038 05/31/20 0920   06/20/2020 2100  remdesivir 200 mg in sodium chloride 0.9% 250 mL IVPB       "Followed by" Linked Group Details   200 mg 580 mL/hr over 30 Minutes Intravenous Once 06/10/2020 2038 06/01/2020 2225      Inpatient Medications  Scheduled Meds: . vitamin C  500 mg Oral Daily  . baricitinib  4 mg Oral QHS  . buPROPion  150 mg Oral q AM  . diltiazem  120 mg Oral QHS  . enoxaparin (LOVENOX) injection  40 mg Subcutaneous Q12H  . insulin aspart  0-15 Units Subcutaneous TID WC  . insulin aspart  0-5 Units Subcutaneous QHS  . insulin aspart  5 Units Subcutaneous TID WC  . insulin glargine  5 Units Subcutaneous BID  . Ipratropium-Albuterol  1 puff Inhalation Q6H  . linagliptin  5 mg Oral Daily  . methylPREDNISolone (SOLU-MEDROL) injection  40 mg Intravenous Q12H  . mometasone-formoterol  2 puff Inhalation BID  . montelukast  10 mg Oral QHS  . oxymetazoline  1 spray Each Nare BID  . pantoprazole  40 mg Oral Daily  . pramipexole  1 mg Oral QHS  . zinc sulfate  220 mg Oral Daily   Continuous Infusions: PRN Meds:.acetaminophen,  albuterol, benzonatate, guaiFENesin-dextromethorphan, ondansetron **OR** ondansetron (ZOFRAN) IV, polyethylene glycol, senna-docusate, sodium chloride   Time Spent in minutes  35   See all Orders from today for further details   Oren Binet M.D on 06/02/2020 at 10:51 AM  To page go to [email or web address] - use universal password  [facility] -  Office  [phone]    Objective:   Vitals:   06/01/20 2338 06/02/20 0229 06/02/20 0400 06/02/20 0819  BP: (!) 109/57 111/66 103/60 109/72  Pulse: 65 68 63 73  Resp: (!) 24 (!) 28  (!) 24 20  Temp: 97.9 F (36.6 C) 97.9 F (36.6 C) 98 F (36.7 C) 97.6 F (36.4 C)  TempSrc: Oral  Axillary Oral  SpO2: (!) 88% 95% 95% (!) 85%    Wt Readings from Last 3 Encounters:  05/24/20 89.3 kg  12/01/19 91.6 kg  11/07/18 88 kg     Intake/Output Summary (Last 24 hours) at 06/02/2020 1051 Last data filed at 06/02/2020 0900 Gross per 24 hour  Intake 600 ml  Output 1 ml  Net 599 ml     Physical Exam Gen Exam:Alert awake-not in any distress HEENT:atraumatic, normocephalic Chest: B/L clear to auscultation anteriorly CVS:S1S2 regular Abdomen:soft non tender, non distended Extremities:no edema Neurology: Non focal Skin: no rash   Data Review:    CBC Recent Labs  Lab 06/08/2020 2000 05/28/20 0400 05/29/20 0438 05/30/20 0632  WBC 9.0 5.8 8.2 8.5  HGB 14.3 12.9 14.5 14.4  HCT 44.1 39.2 43.7 44.3  PLT 147* 144* 167 213  MCV 89.3 91.0 87.8 89.3  MCH 28.9 29.9 29.1 29.0  MCHC 32.4 32.9 33.2 32.5  RDW 13.1 12.8 12.5 12.8  LYMPHSABS 0.9 0.4* 0.8 1.1  MONOABS 0.4 0.2 0.4 0.5  EOSABS 0.0 0.0 0.0 0.0  BASOSABS 0.0 0.0 0.0 0.0    Chemistries  Recent Labs  Lab 05/28/20 0400 05/29/20 0438 05/30/20 0632 05/31/20 0541 06/02/20 0240  NA 134* 135 135 137 136  K 4.0 3.8 4.4 4.7 4.7  CL 103 99 98 101 101  CO2 19* 24 27 27 27   GLUCOSE 306* 237* 234* 237* 227*  BUN 10 17 24* 28* 23*  CREATININE 0.95 0.85 1.01* 0.90 0.85  CALCIUM 7.6* 8.8* 9.0 8.5* 8.5*  MG  --  2.0 2.3  --   --   AST 41 38 36 29 23  ALT 43 42 37 27 29  ALKPHOS 60 69 71 70 79  BILITOT 0.5 0.8 0.6 0.8 1.0   ------------------------------------------------------------------------------------------------------------------ No results for input(s): CHOL, HDL, LDLCALC, TRIG, CHOLHDL, LDLDIRECT in the last 72 hours.  Lab Results  Component Value Date   HGBA1C 6.9 (H) 05/28/2020    ------------------------------------------------------------------------------------------------------------------ No results for input(s): TSH, T4TOTAL, T3FREE, THYROIDAB in the last 72 hours.  Invalid input(s): FREET3 ------------------------------------------------------------------------------------------------------------------ No results for input(s): VITAMINB12, FOLATE, FERRITIN, TIBC, IRON, RETICCTPCT in the last 72 hours.  Coagulation profile No results for input(s): INR, PROTIME in the last 168 hours.  Recent Labs    05/31/20 1116 06/02/20 0240  DDIMER 0.99* 1.08*    Cardiac Enzymes No results for input(s): CKMB, TROPONINI, MYOGLOBIN in the last 168 hours.  Invalid input(s): CK ------------------------------------------------------------------------------------------------------------------    Component Value Date/Time   BNP 100.4 (H) 05/29/2020 9242    Micro Results Recent Results (from the past 240 hour(s))  Blood Culture (routine x 2)     Status: None   Collection Time: 05/24/2020  8:01 PM   Specimen: BLOOD  Result Value Ref  Range Status   Specimen Description BLOOD LEFT ANTECUBITAL  Final   Special Requests   Final    BOTTLES DRAWN AEROBIC AND ANAEROBIC Blood Culture adequate volume   Culture   Final    NO GROWTH 5 DAYS Performed at [facility], [address], [place], [place] [address]    Report Status 06/01/2020 FINAL  Final  SARS Coronavirus 2 by RT PCR (hospital order, performed in [facility] hospital lab) Nasopharyngeal Nasopharyngeal Swab     Status: Abnormal   Collection Time: 06/08/2020  8:40 PM   Specimen: Nasopharyngeal Swab  Result Value Ref Range Status   SARS Coronavirus 2 POSITIVE (A) NEGATIVE Final    Comment: RESULT CALLED TO, READ BACK BY AND VERIFIED WITH: RN J SAWATZKI @2301  06/12/2020 BY S GEZAHEGN (NOTE) SARS-CoV-2 target nucleic acids are DETECTED  SARS-CoV-2 RNA is generally detectable in upper respiratory specimens  during  the acute phase of infection.  Positive results are indicative  of the presence of the identified virus, but do not rule out bacterial infection or co-infection with other pathogens not detected by the test.  Clinical correlation with patient history and  other diagnostic information is necessary to determine patient infection status.  The expected result is negative.  Fact Sheet for Patients:   StrictlyIdeas.no   Fact Sheet for Healthcare Providers:   BankingDealers.co.za    This test is not yet approved or cleared by the [place] FDA and  has been authorized for detection and/or diagnosis of SARS-CoV-2 by FDA under an Emergency Use Authorization (EUA).  This EUA will remain in effect (meaning t his test can be used) for the duration of  the COVID-19 declaration under Section 564(b)(1) of the Act, 21 U.S.C. section 360-bbb-3(b)(1), unless the authorization is terminated or revoked sooner.  Performed at [facility], [place] [address] [place], [place] [address]   Blood Culture (routine x 2)     Status: None   Collection Time: 06/06/2020  8:58 PM   Specimen: BLOOD  Result Value Ref Range Status   Specimen Description BLOOD RIGHT ANTECUBITAL  Final   Special Requests   Final    BOTTLES DRAWN AEROBIC AND ANAEROBIC Blood Culture adequate volume   Culture   Final    NO GROWTH 5 DAYS Performed at [facility], [place] [address] [place], [place] [address]    Report Status 06/01/2020 FINAL  Final  MRSA PCR Screening     Status: None   Collection Time: 05/29/20  6:35 PM   Specimen: Nasal Mucosa; Nasopharyngeal  Result Value Ref Range Status   MRSA by PCR NEGATIVE NEGATIVE Final    Comment:        The GeneXpert MRSA Assay (FDA approved for NASAL specimens only), is one component of a comprehensive MRSA colonization surveillance program. It is not intended to diagnose MRSA infection nor to guide or monitor treatment  for MRSA infections. Performed at [facility], [place] [address], [place], [place] [address]     Radiology Reports DG Chest [place] 1 View  Result Date: 06/05/2020 CLINICAL DATA:  Cough, COVID, hypoxia EXAM: PORTABLE CHEST 1 VIEW COMPARISON:  11/07/2018 FINDINGS: Lung volumes are small. Bibasilar asymmetric moderate pulmonary infiltrates are present in keeping with atypical infection and compatible with the given history of COVID-19 pneumonia. No pneumothorax or pleural effusion. Cardiac size within normal limits. Pulmonary vascularity is normal. No acute bone abnormality. IMPRESSION: Bibasilar asymmetric moderate pulmonary infiltrates compatible with the  given history of COVID-19 pneumonia. Electronically Signed   By: Fidela Salisbury MD   On: 06/10/2020 20:23

## 2020-06-02 NOTE — Progress Notes (Signed)
During this shift, patient was laying on her side and BP reading taken during that time was 94/48.  This triggered MEWS  To yellow.  MEWS score protocol was followed and discussed with charge RN.  Patient was not in any acute distress.  A repeat vital sign was done and MEWS came back to green.  Patient is resting in bed.  Will continue to monitor.

## 2020-06-02 NOTE — Plan of Care (Signed)
  Problem: Education: Goal: Ability to state activities that reduce stress will improve Outcome: Progressing   Problem: Self-Concept: Goal: Ability to identify factors that promote anxiety will improve Outcome: Progressing Goal: Level of anxiety will decrease Outcome: Progressing   Problem: Health Behavior/Discharge Planning: Goal: Ability to manage health-related needs will improve Outcome: Progressing   Problem: Clinical Measurements: Goal: Will remain free from infection Outcome: Progressing

## 2020-06-03 ENCOUNTER — Inpatient Hospital Stay (HOSPITAL_COMMUNITY): Payer: 59

## 2020-06-03 DIAGNOSIS — U071 COVID-19: Secondary | ICD-10-CM

## 2020-06-03 DIAGNOSIS — J9601 Acute respiratory failure with hypoxia: Secondary | ICD-10-CM

## 2020-06-03 DIAGNOSIS — I10 Essential (primary) hypertension: Secondary | ICD-10-CM

## 2020-06-03 DIAGNOSIS — J1282 Pneumonia due to coronavirus disease 2019: Secondary | ICD-10-CM

## 2020-06-03 LAB — COMPREHENSIVE METABOLIC PANEL
ALT: 22 U/L (ref 0–44)
AST: 34 U/L (ref 15–41)
Albumin: 2.6 g/dL — ABNORMAL LOW (ref 3.5–5.0)
Alkaline Phosphatase: 77 U/L (ref 38–126)
Anion gap: 9 (ref 5–15)
BUN: 23 mg/dL — ABNORMAL HIGH (ref 6–20)
CO2: 24 mmol/L (ref 22–32)
Calcium: 8.6 mg/dL — ABNORMAL LOW (ref 8.9–10.3)
Chloride: 104 mmol/L (ref 98–111)
Creatinine, Ser: 0.77 mg/dL (ref 0.44–1.00)
GFR calc Af Amer: >60 mL/min (ref >60)
GFR calc non Af Amer: >60 mL/min (ref >60)
Glucose, Bld: 116 mg/dL — ABNORMAL HIGH (ref 70–99)
Potassium: 5.8 mmol/L — ABNORMAL HIGH (ref 3.5–5.1)
Sodium: 137 mmol/L (ref 135–145)
Total Bilirubin: 1.6 mg/dL — ABNORMAL HIGH (ref 0.3–1.2)
Total Protein: 5.3 g/dL — ABNORMAL LOW (ref 6.5–8.1)

## 2020-06-03 LAB — POTASSIUM: Potassium: 4.8 mmol/L (ref 3.5–5.1)

## 2020-06-03 LAB — GLUCOSE, CAPILLARY
Glucose-Capillary: 103 mg/dL — ABNORMAL HIGH (ref 70–99)
Glucose-Capillary: 179 mg/dL — ABNORMAL HIGH (ref 70–99)
Glucose-Capillary: 185 mg/dL — ABNORMAL HIGH (ref 70–99)
Glucose-Capillary: 342 mg/dL — ABNORMAL HIGH (ref 70–99)

## 2020-06-03 LAB — CBC
HCT: 44.8 % (ref 36.0–46.0)
Hemoglobin: 14.3 g/dL (ref 12.0–15.0)
MCH: 28.8 pg (ref 26.0–34.0)
MCHC: 31.9 g/dL (ref 30.0–36.0)
MCV: 90.1 fL (ref 80.0–100.0)
Platelets: 261 10*3/uL (ref 150–400)
RBC: 4.97 MIL/uL (ref 3.87–5.11)
RDW: 12.4 % (ref 11.5–15.5)
WBC: 13.7 10*3/uL — ABNORMAL HIGH (ref 4.0–10.5)
nRBC: 0 % (ref 0.0–0.2)

## 2020-06-03 LAB — C-REACTIVE PROTEIN: CRP: 0.6 mg/dL (ref <1.0)

## 2020-06-03 LAB — D-DIMER, QUANTITATIVE: D-Dimer, Quant: 1.51 ug/mL-FEU — ABNORMAL HIGH (ref 0.00–0.50)

## 2020-06-03 LAB — FERRITIN: Ferritin: 581 ng/mL — ABNORMAL HIGH (ref 11–307)

## 2020-06-03 MED ORDER — POLYETHYLENE GLYCOL 3350 17 G PO PACK
34.0000 g | PACK | Freq: Two times a day (BID) | ORAL | Status: AC
  Administered 2020-06-03 (×2): 34 g via ORAL
  Filled 2020-06-03 (×2): qty 2

## 2020-06-03 NOTE — Progress Notes (Signed)
   06/03/20 1225  Assess: MEWS Score  Temp 98.5 F (36.9 C)  BP (!) 90/57  Pulse Rate 83  ECG Heart Rate 83  Resp (!) 38  Level of Consciousness Alert  SpO2 96 %  O2 Device HFNC;Non-rebreather Mask  Patient Activity (if Appropriate) In bed  O2 Flow Rate (L/min) 15 L/min  FiO2 (%) 100 %  Assess: MEWS Score  MEWS Temp 0  MEWS Systolic 1  MEWS Pulse 0  MEWS RR 3  MEWS LOC 0  MEWS Score 4  MEWS Score Color Red  Assess: if the MEWS score is Yellow or Red  Were vital signs taken at a resting state? Yes  Focused Assessment No change from prior assessment  Early Detection of Sepsis Score *See Row Information* Low  MEWS guidelines implemented *See Row Information* Yes  Treat  MEWS Interventions Other (Comment) (informed MD )  Pain Scale 0-10  Pain Score 0  Take Vital Signs  Increase Vital Sign Frequency  Red: Q 1hr X 4 then Q 4hr X 4, if remains red, continue Q 4hrs  Escalate  MEWS: Escalate Red: discuss with charge nurse/RN and provider, consider discussing with RRT  Notify: Charge Nurse/RN  Name of Charge Nurse/RN Notified Erin,RN  Date Charge Nurse/RN Notified 06/03/20  Time Charge Nurse/RN Notified 1227  Notify: Provider  Provider Name/Title Phillips Climes, MD  Date Provider Notified 06/03/20  Time Provider Notified 1227  Notification Type Call  Notification Reason Change in status  Response No new orders  Date of Provider Response 06/03/20  Time of Provider Response [id number]

## 2020-06-03 NOTE — Progress Notes (Signed)
PROGRESS NOTE                                                                                                                                                                                                             Patient Demographics:    Claudia Vega, is a 59 y.o. female, DOB - [birth year], SWH:[id number]  Outpatient Primary MD for the patient is Greig Right, MD   Admit date - 06/16/2020   LOS - 7  Chief Complaint  Patient presents with  . Shortness of Breath  . Cough       Brief Narrative: Patient is a 59 y.o. female with PMHx of HTN, asthma-who tested positive for COVID-19 on 9/5-admitted for worsening shortness of breath due to acute hypoxic respiratory failure requiring HFNC secondary to COVID-19 pneumonia  COVID-19 vaccinated status: Unvaccinated  Significant Events: 9/5>> Admit to [facility] for hypoxia due to COVID-19  Significant studies: 9/5>>Chest x-ray: Patchy bilateral infiltrates  COVID-19 medications: Steroids:9/5>> Remdesivir:9/5>>9/9 Baricitinib:9/5>>  Antibiotics: None  Microbiology data: 9/5>>Blood culture:no growth  Procedures: None  Consults: None  DVT prophylaxis: Prophylactic Lovenox at intermediate-twice daily dosing.   Subjective:    Claudia Vega today still reports dyspnea, still requiring some oxygen flow, on both NRB and high flow nasal cannula, she does report cough as well, denies any chest pain.  She does report constipation as well.   Assessment  & Plan :   Acute Hypoxic Resp Failure due to Covid 19 Viral pneumonia:  -Respiratory status remains very tenuous, continues to have severe hypoxemia-on 15 L HFNC and NRB.  Looks comfortable. -Continue with steroids -Continue with baricitinib. -Treated with IV remdesivir. -Diuresis as needed, blood pressure is soft,. -Continue to trend inflammatory markers. -I have discussed at length with the patient, encouraged her  to keep using incentive spirometry, flutter valve, out of bed to chair and to prone.    Fever: afebrile O2 requirements:  SpO2: 95 % O2 Flow Rate (L/min): 15 L/min FiO2 (%): 100 %   COVID-19 Labs: Recent Labs    06/02/20 0240 06/03/20 0500  DDIMER 1.08* 1.51*  FERRITIN  --  581*  CRP  --  0.6       Component Value Date/Time   BNP 100.4 (H) 05/29/2020 0438    Recent Labs  Lab 06/09/2020 2000  PROCALCITON <0.10    Lab  Results  Component Value Date   SARSCOV2NAA POSITIVE (A) 06/13/2020     Prone/Incentive Spirometry: encouraged patient to lie prone for 3-4 hours at a time for a total of 16 hours a day, and to encourage incentive spirometry use 3-4/hour.  DM-2 (A1c 6.9 on 9/6) with steroid-induced hyperglycemia: CBGs relatively stable but still on the higher side-increase Lantus to 10 units twice daily, continue 5 units of NovoLog with meals and SSI.  Follow and adjust accordingly  Recent Labs    06/02/20 2121 06/03/20 0743 06/03/20 1225  GLUCAP 210* 103* 179*    HTN:  -Blood pressure is soft today, I have stopped her Cardizem.  Bronchial asthma: No wheezing-stable-continue inhaler regimen  Morbid Obesity: Estimated body mass index is 38.56 kg/m as calculated from the following:   Height as of 05/24/20: 4' 11.9" (1.521 m).   Weight as of 05/24/20: 89.3 kg.   She does report constipation, I have started on laxatives.  GI prophylaxis: PPI  ABG: No results found for: PHART, PCO2ART, PO2ART, HCO3, TCO2, ACIDBASEDEF, O2SAT  Vent Settings: N/A  Condition - Extremely Guarded  Family Communication  :  Spouse Ende [phone]  updated over the phone 9/12  Code Status :  Full Code  Diet :  Diet Order            Diet Carb Modified Fluid consistency: Thin; Room service appropriate? Yes  Diet effective now                  Disposition Plan  :   Status is: Inpatient  Remains inpatient appropriate because:Inpatient level of care appropriate due to  severity of illness   Dispo: The patient is from: Home              Anticipated d/c is to: Home              Anticipated d/c date is: > 3 days              Patient currently is not medically stable to d/c.   Barriers to discharge: Hypoxia requiring O2 supplementation/complete 5 days of IV Remdesivir  Antimicorbials  :    Anti-infectives (From admission, onward)   Start     Dose/Rate Route Frequency Ordered Stop   05/28/20 1000  remdesivir 100 mg in sodium chloride 0.9 % 100 mL IVPB       "Followed by" Linked Group Details   100 mg 200 mL/hr over 30 Minutes Intravenous Every 24 hours 06/04/2020 2038 05/31/20 0920   06/21/2020 2100  remdesivir 200 mg in sodium chloride 0.9% 250 mL IVPB       "Followed by" Linked Group Details   200 mg 580 mL/hr over 30 Minutes Intravenous Once 05/24/2020 2038 05/26/2020 2225      Inpatient Medications  Scheduled Meds: . vitamin C  500 mg Oral Daily  . baricitinib  4 mg Oral QHS  . buPROPion  150 mg Oral q AM  . enoxaparin (LOVENOX) injection  40 mg Subcutaneous Q12H  . insulin aspart  0-15 Units Subcutaneous TID WC  . insulin aspart  0-5 Units Subcutaneous QHS  . insulin aspart  5 Units Subcutaneous TID WC  . insulin glargine  10 Units Subcutaneous BID  . Ipratropium-Albuterol  1 puff Inhalation Q6H  . linagliptin  5 mg Oral Daily  . methylPREDNISolone (SOLU-MEDROL) injection  40 mg Intravenous Q12H  . mometasone-formoterol  2 puff Inhalation BID  . montelukast  10 mg Oral QHS  .  oxymetazoline  1 spray Each Nare BID  . pantoprazole  40 mg Oral Daily  . polyethylene glycol  34 g Oral BID  . pramipexole  1 mg Oral QHS  . zinc sulfate  220 mg Oral Daily   Continuous Infusions: PRN Meds:.acetaminophen, albuterol, benzonatate, guaiFENesin-dextromethorphan, ondansetron **OR** ondansetron (ZOFRAN) IV, polyethylene glycol, senna-docusate, sodium chloride   Time Spent in minutes  35   See all Orders from today for further details   Phillips Climes M.D on 06/03/2020 at 12:57 PM  To page go to [email or web address] - use universal password  [facility] -  Office  [phone]    Objective:   Vitals:   06/03/20 1006 06/03/20 1208 06/03/20 1225 06/03/20 1245  BP:   (!) 90/57 93/64  Pulse: 75  83 78  Resp: (!) 21  (!) 38 (!) 30  Temp:   98.5 F (36.9 C) 98.5 F (36.9 C)  TempSrc:   Axillary Axillary  SpO2: (!) 86% 97% 96% 95%    Wt Readings from Last 3 Encounters:  05/24/20 89.3 kg  12/01/19 91.6 kg  11/07/18 88 kg     Intake/Output Summary (Last 24 hours) at 06/03/2020 1257 Last data filed at 06/03/2020 0900 Gross per 24 hour  Intake 440 ml  Output --  Net 440 ml     Physical Exam Awake Alert, Oriented X 3, No new F.N deficits, Normal affect Symmetrical Chest wall movement, Good air movement bilaterally, CTAB RRR,No Gallops,Rubs or new Murmurs, No Parasternal Heave +ve B.Sounds, Abd Soft, No tenderness, No rebound - guarding or rigidity. No Cyanosis, Clubbing or edema, No new Rash or bruise     Data Review:    CBC Recent Labs  Lab 06/05/2020 2000 05/28/20 0400 05/29/20 0438 05/30/20 0632 06/03/20 0500  WBC 9.0 5.8 8.2 8.5 13.7*  HGB 14.3 12.9 14.5 14.4 14.3  HCT 44.1 39.2 43.7 44.3 44.8  PLT 147* 144* 167 213 261  MCV 89.3 91.0 87.8 89.3 90.1  MCH 28.9 29.9 29.1 29.0 28.8  MCHC 32.4 32.9 33.2 32.5 31.9  RDW 13.1 12.8 12.5 12.8 12.4  LYMPHSABS 0.9 0.4* 0.8 1.1  --   MONOABS 0.4 0.2 0.4 0.5  --   EOSABS 0.0 0.0 0.0 0.0  --   BASOSABS 0.0 0.0 0.0 0.0  --     Chemistries  Recent Labs  Lab 05/29/20 0438 05/29/20 0438 05/30/20 0632 05/31/20 0541 06/02/20 0240 06/03/20 0500 06/03/20 0755  NA 135  --  135 137 136 137  --   K 3.8   < > 4.4 4.7 4.7 5.8* 4.8  CL 99  --  98 101 101 104  --   CO2 24  --  27 27 27 24   --   GLUCOSE 237*  --  234* 237* 227* 116*  --   BUN 17  --  24* 28* 23* 23*  --   CREATININE 0.85  --  1.01* 0.90 0.85 0.77  --   CALCIUM 8.8*  --  9.0 8.5* 8.5* 8.6*  --    MG 2.0  --  2.3  --   --   --   --   AST 38  --  36 29 23 34  --   ALT 42  --  37 27 29 22   --   ALKPHOS 69  --  71 70 79 77  --   BILITOT 0.8  --  0.6 0.8 1.0 1.6*  --    < > =  values in this interval not displayed.   ------------------------------------------------------------------------------------------------------------------ No results for input(s): CHOL, HDL, LDLCALC, TRIG, CHOLHDL, LDLDIRECT in the last 72 hours.  Lab Results  Component Value Date   HGBA1C 6.9 (H) 05/28/2020   ------------------------------------------------------------------------------------------------------------------ No results for input(s): TSH, T4TOTAL, T3FREE, THYROIDAB in the last 72 hours.  Invalid input(s): FREET3 ------------------------------------------------------------------------------------------------------------------ Recent Labs    06/03/20 0500  FERRITIN 581*    Coagulation profile No results for input(s): INR, PROTIME in the last 168 hours.  Recent Labs    06/02/20 0240 06/03/20 0500  DDIMER 1.08* 1.51*    Cardiac Enzymes No results for input(s): CKMB, TROPONINI, MYOGLOBIN in the last 168 hours.  Invalid input(s): CK ------------------------------------------------------------------------------------------------------------------    Component Value Date/Time   BNP 100.4 (H) 05/29/2020 0438    Micro Results Recent Results (from the past 240 hour(s))  Blood Culture (routine x 2)     Status: None   Collection Time: 06/17/2020  8:01 PM   Specimen: BLOOD  Result Value Ref Range Status   Specimen Description BLOOD LEFT ANTECUBITAL  Final   Special Requests   Final    BOTTLES DRAWN AEROBIC AND ANAEROBIC Blood Culture adequate volume   Culture   Final    NO GROWTH 5 DAYS Performed at [facility], [address] [place], [place] [address]    Report Status 06/01/2020 FINAL  Final  SARS Coronavirus 2 by RT PCR (hospital order, performed in [facility] hospital  lab) Nasopharyngeal Nasopharyngeal Swab     Status: Abnormal   Collection Time: 06/09/2020  8:40 PM   Specimen: Nasopharyngeal Swab  Result Value Ref Range Status   SARS Coronavirus 2 POSITIVE (A) NEGATIVE Final    Comment: RESULT CALLED TO, READ BACK BY AND VERIFIED WITH: RN J SAWATZKI @2301  05/26/2020 BY S GEZAHEGN (NOTE) SARS-CoV-2 target nucleic acids are DETECTED  SARS-CoV-2 RNA is generally detectable in upper respiratory specimens  during the acute phase of infection.  Positive results are indicative  of the presence of the identified virus, but do not rule out bacterial infection or co-infection with other pathogens not detected by the test.  Clinical correlation with patient history and  other diagnostic information is necessary to determine patient infection status.  The expected result is negative.  Fact Sheet for Patients:   StrictlyIdeas.no   Fact Sheet for Healthcare Providers:   BankingDealers.co.za    This test is not yet approved or cleared by the [place] FDA and  has been authorized for detection and/or diagnosis of SARS-CoV-2 by FDA under an Emergency Use Authorization (EUA).  This EUA will remain in effect (meaning t his test can be used) for the duration of  the COVID-19 declaration under Section 564(b)(1) of the Act, 21 U.S.C. section 360-bbb-3(b)(1), unless the authorization is terminated or revoked sooner.  Performed at [facility], [place] [address] [place], [place] [address]   Blood Culture (routine x 2)     Status: None   Collection Time: 05/29/2020  8:58 PM   Specimen: BLOOD  Result Value Ref Range Status   Specimen Description BLOOD RIGHT ANTECUBITAL  Final   Special Requests   Final    BOTTLES DRAWN AEROBIC AND ANAEROBIC Blood Culture adequate volume   Culture   Final    NO GROWTH 5 DAYS Performed at [facility], [place] [address], [place], [place] [address]    Report Status 06/01/2020  FINAL  Final  MRSA PCR Screening  Status: None   Collection Time: 05/29/20  6:35 PM   Specimen: Nasal Mucosa; Nasopharyngeal  Result Value Ref Range Status   MRSA by PCR NEGATIVE NEGATIVE Final    Comment:        The GeneXpert MRSA Assay (FDA approved for NASAL specimens only), is one component of a comprehensive MRSA colonization surveillance program. It is not intended to diagnose MRSA infection nor to guide or monitor treatment for MRSA infections. Performed at [facility], [place] [address], [place], [place] [address]     Radiology Reports DG Chest [place] 1 View  Result Date: 06/03/2020 CLINICAL DATA:  Follow-up COVID-19 pneumonia. EXAM: PORTABLE CHEST 1 VIEW COMPARISON:  06/20/2020 and earlier. FINDINGS: Cardiac silhouette upper normal in size for AP portable technique. Patchy ground-glass opacities throughout the LEFT lung and in the RIGHT mid lung and RIGHT lung base are unchanged since the examination 1 week ago. No new pulmonary parenchymal abnormalities. No visible pleural effusions. IMPRESSION: Stable pneumonia throughout the LEFT lung and in the RIGHT mid lung and RIGHT lung base since the examination 1 week ago. No new abnormalities. Electronically Signed   By: Evangeline Dakin M.D.   On: 06/03/2020 09:28   DG Chest Port 1 View  Result Date: 05/26/2020 CLINICAL DATA:  Cough, COVID, hypoxia EXAM: PORTABLE CHEST 1 VIEW COMPARISON:  11/07/2018 FINDINGS: Lung volumes are small. Bibasilar asymmetric moderate pulmonary infiltrates are present in keeping with atypical infection and compatible with the given history of COVID-19 pneumonia. No pneumothorax or pleural effusion. Cardiac size within normal limits. Pulmonary vascularity is normal. No acute bone abnormality. IMPRESSION: Bibasilar asymmetric moderate pulmonary infiltrates compatible with the given history of COVID-19 pneumonia. Electronically Signed   By: Fidela Salisbury MD   On: 06/10/2020 20:23

## 2020-06-04 ENCOUNTER — Encounter: Payer: Self-pay | Admitting: Allergy and Immunology

## 2020-06-04 LAB — COMPREHENSIVE METABOLIC PANEL
ALT: 30 U/L (ref 0–44)
AST: 27 U/L (ref 15–41)
Albumin: 2.6 g/dL — ABNORMAL LOW (ref 3.5–5.0)
Alkaline Phosphatase: 76 U/L (ref 38–126)
Anion gap: 8 (ref 5–15)
BUN: 19 mg/dL (ref 6–20)
CO2: 30 mmol/L (ref 22–32)
Calcium: 8.5 mg/dL — ABNORMAL LOW (ref 8.9–10.3)
Chloride: 101 mmol/L (ref 98–111)
Creatinine, Ser: 0.9 mg/dL (ref 0.44–1.00)
GFR calc Af Amer: >60 mL/min (ref >60)
GFR calc non Af Amer: >60 mL/min (ref >60)
Glucose, Bld: 76 mg/dL (ref 70–99)
Potassium: 4.3 mmol/L (ref 3.5–5.1)
Sodium: 139 mmol/L (ref 135–145)
Total Bilirubin: 1 mg/dL (ref 0.3–1.2)
Total Protein: 5.7 g/dL — ABNORMAL LOW (ref 6.5–8.1)

## 2020-06-04 LAB — CBC
HCT: 43.7 % (ref 36.0–46.0)
Hemoglobin: 14.2 g/dL (ref 12.0–15.0)
MCH: 28.9 pg (ref 26.0–34.0)
MCHC: 32.5 g/dL (ref 30.0–36.0)
MCV: 89 fL (ref 80.0–100.0)
Platelets: 341 10*3/uL (ref 150–400)
RBC: 4.91 MIL/uL (ref 3.87–5.11)
RDW: 12.3 % (ref 11.5–15.5)
WBC: 14.1 10*3/uL — ABNORMAL HIGH (ref 4.0–10.5)
nRBC: 0 % (ref 0.0–0.2)

## 2020-06-04 LAB — GLUCOSE, CAPILLARY
Glucose-Capillary: 81 mg/dL (ref 70–99)
Glucose-Capillary: 191 mg/dL — ABNORMAL HIGH (ref 70–99)
Glucose-Capillary: 277 mg/dL — ABNORMAL HIGH (ref 70–99)
Glucose-Capillary: 186 mg/dL — ABNORMAL HIGH (ref 70–99)

## 2020-06-04 LAB — D-DIMER, QUANTITATIVE: D-Dimer, Quant: 1.98 ug/mL-FEU — ABNORMAL HIGH (ref 0.00–0.50)

## 2020-06-04 LAB — C-REACTIVE PROTEIN: CRP: 7.9 mg/dL — ABNORMAL HIGH (ref <1.0)

## 2020-06-04 LAB — PROCALCITONIN: Procalcitonin: <0.1 ng/mL

## 2020-06-04 LAB — BRAIN NATRIURETIC PEPTIDE: B Natriuretic Peptide: 37.8 pg/mL (ref 0.0–100.0)

## 2020-06-04 LAB — FERRITIN: Ferritin: 660 ng/mL — ABNORMAL HIGH (ref 11–307)

## 2020-06-04 MED ORDER — SENNOSIDES-DOCUSATE SODIUM 8.6-50 MG PO TABS
2.0000 | ORAL_TABLET | Freq: Two times a day (BID) | ORAL | Status: DC
  Administered 2020-06-05 – 2020-06-15 (×7): 2 via ORAL
  Filled 2020-06-04 (×19): qty 2

## 2020-06-04 NOTE — Progress Notes (Addendum)
PROGRESS NOTE                                                                                                                                                                                                             Patient Demographics:    Claudia Vega, is a 59 y.o. female, DOB - 02/02/1961, OAC:[id number]  Outpatient Primary MD for the patient is Greig Right, MD   Admit date - 06/08/2020   LOS - 8  Chief Complaint  Patient presents with  . Shortness of Breath  . Cough       Brief Narrative: Patient is a 59 y.o. female with PMHx of HTN, asthma-who tested positive for COVID-19 on 9/5-admitted for worsening shortness of breath due to acute hypoxic respiratory failure requiring HFNC secondary to COVID-19 pneumonia  COVID-19 vaccinated status: Unvaccinated  Significant Events: 9/5>> Admit to [facility] for hypoxia due to COVID-19  Significant studies: 9/5>>Chest x-ray: Patchy bilateral infiltrates  COVID-19 medications: Steroids:9/5>> Remdesivir:9/5>>9/9 Baricitinib:9/5>>  Antibiotics: None  Microbiology data: 9/5>>Blood culture:no growth  Procedures: None  Consults: None  DVT prophylaxis: Prophylactic Lovenox at intermediate-twice daily dosing.   Subjective:    Claudia Vega today still reports dyspnea, remains on same oxygen requirement, he denies any chest pain.    Assessment  & Plan :   Acute Hypoxic Resp Failure due to Covid 19 Viral pneumonia:  -Respiratory status remains tenuous and critical, she is with severe hypoxemia, severe COVID-19 pneumonia, she is on 15 L high flow nasal cannula and 15 L NRB.    -Continue with steroids -Continue with baricitinib. -Treated with IV remdesivir. -Diuresis as needed, blood pressure is soft, as well BNP within normal limit, so we will hold on any Lasix today. -Continue to trend inflammatory markers. -I have discussed at length with the patient,  encouraged her to keep using incentive spirometry, flutter valve, out of bed to chair and to prone.    Fever: afebrile O2 requirements:  SpO2: 96 % O2 Flow Rate (L/min): 15 L/min FiO2 (%): 100 %   COVID-19 Labs: Recent Labs    06/02/20 0240 06/03/20 0500 06/04/20 0515  DDIMER 1.08* 1.51* 1.98*  FERRITIN  --  581* 660*  CRP  --  0.6 7.9*       Component Value Date/Time   BNP 37.8 06/04/2020 0515    Recent Labs  Lab 06/04/20 0515  PROCALCITON <0.10    Lab Results  Component Value Date   SARSCOV2NAA POSITIVE (A) 06/12/2020     Prone/Incentive Spirometry: encouraged patient to lie prone for 3-4 hours at a time for a total of 16 hours a day, and to encourage incentive spirometry use 3-4/hour.  DM-2 (A1c 6.9 on 9/6) with steroid-induced hyperglycemia: -Her CBG was low this morning, I have stopped NovoLog before meals, continue with Lantus and sliding scale.    Recent Labs    06/03/20 2053 06/04/20 0733 06/04/20 1206  GLUCAP 342* 81 191*    HTN:  -Blood pressure is soft, improving after holding Cardizem.    Bronchial asthma: No wheezing-stable-continue inhaler regimen  Morbid Obesity: Estimated body mass index is 38.56 kg/m as calculated from the following:   Height as of 05/24/20: 4' 11.9" (1.521 m).   Weight as of 05/24/20: 89.3 kg.    GI prophylaxis: PPI  ABG: No results found for: PHART, PCO2ART, PO2ART, HCO3, TCO2, ACIDBASEDEF, O2SAT  Vent Settings: N/A  Condition - Extremely Guarded  Family Communication  :  Spouse Mealing [phone]  updated over the phone 9/13.  Code Status :  Full Code  Diet :  Diet Order            Diet Carb Modified Fluid consistency: Thin; Room service appropriate? Yes  Diet effective now                  Disposition Plan  :   Status is: Inpatient  Remains inpatient appropriate because:Inpatient level of care appropriate due to severity of illness   Dispo: The patient is from: Home              Anticipated  d/c is to: Home              Anticipated d/c date is: > 3 days              Patient currently is not medically stable to d/c.   Barriers to discharge: Hypoxia requiring O2 supplementation/complete 5 days of IV Remdesivir  Antimicorbials  :    Anti-infectives (From admission, onward)   Start     Dose/Rate Route Frequency Ordered Stop   05/28/20 1000  remdesivir 100 mg in sodium chloride 0.9 % 100 mL IVPB       "Followed by" Linked Group Details   100 mg 200 mL/hr over 30 Minutes Intravenous Every 24 hours 06/12/2020 2038 05/31/20 0920   06/18/2020 2100  remdesivir 200 mg in sodium chloride 0.9% 250 mL IVPB       "Followed by" Linked Group Details   200 mg 580 mL/hr over 30 Minutes Intravenous Once 06/05/2020 2038 06/18/2020 2225      Inpatient Medications  Scheduled Meds: . vitamin C  500 mg Oral Daily  . baricitinib  4 mg Oral QHS  . buPROPion  150 mg Oral q AM  . enoxaparin (LOVENOX) injection  40 mg Subcutaneous Q12H  . insulin aspart  0-15 Units Subcutaneous TID WC  . insulin aspart  0-5 Units Subcutaneous QHS  . insulin aspart  5 Units Subcutaneous TID WC  . insulin glargine  10 Units Subcutaneous BID  . Ipratropium-Albuterol  1 puff Inhalation Q6H  . linagliptin  5 mg Oral Daily  . methylPREDNISolone (SOLU-MEDROL) injection  40 mg Intravenous Q12H  . mometasone-formoterol  2 puff Inhalation BID  . montelukast  10 mg Oral QHS  . pantoprazole  40 mg Oral Daily  .  pramipexole  1 mg Oral QHS  . zinc sulfate  220 mg Oral Daily   Continuous Infusions: PRN Meds:.acetaminophen, albuterol, benzonatate, guaiFENesin-dextromethorphan, ondansetron **OR** ondansetron (ZOFRAN) IV, polyethylene glycol, senna-docusate, sodium chloride   Time Spent in minutes  35   See all Orders from today for further details   Phillips Climes M.D on 06/04/2020 at 12:33 PM  To page go to [email or web address] - use universal password  [facility] -  Office  [phone]    Objective:   Vitals:     06/04/20 0400 06/04/20 0753 06/04/20 0800 06/04/20 1006  BP: 107/77 104/76 109/79   Pulse: 66 69 71   Resp: (!) 25 (!) 35 (!) 31   Temp: 98.3 F (36.8 C) 97.9 F (36.6 C) 98.3 F (36.8 C) 98.3 F (36.8 C)  TempSrc: Axillary Oral  Oral  SpO2: 93% 90% 96%     Wt Readings from Last 3 Encounters:  05/24/20 89.3 kg  12/01/19 91.6 kg  11/07/18 88 kg     Intake/Output Summary (Last 24 hours) at 06/04/2020 1233 Last data filed at 06/03/2020 1640 Gross per 24 hour  Intake 120 ml  Output --  Net 120 ml     Physical Exam  Awake Alert, Oriented X 3, No new F.N deficits, Normal affect Symmetrical Chest wall movement, Good air movement bilaterally, CTAB RRR,No Gallops,Rubs or new Murmurs, No Parasternal Heave +ve B.Sounds, Abd Soft, No tenderness, No rebound - guarding or rigidity. No Cyanosis, Clubbing or edema, No new Rash or bruise      Data Review:    CBC Recent Labs  Lab 05/29/20 0438 05/30/20 0632 06/03/20 0500 06/04/20 0515  WBC 8.2 8.5 13.7* 14.1*  HGB 14.5 14.4 14.3 14.2  HCT 43.7 44.3 44.8 43.7  PLT 167 213 261 341  MCV 87.8 89.3 90.1 89.0  MCH 29.1 29.0 28.8 28.9  MCHC 33.2 32.5 31.9 32.5  RDW 12.5 12.8 12.4 12.3  LYMPHSABS 0.8 1.1  --   --   MONOABS 0.4 0.5  --   --   EOSABS 0.0 0.0  --   --   BASOSABS 0.0 0.0  --   --     Chemistries  Recent Labs  Lab 05/29/20 0438 05/29/20 0438 05/30/20 1093 05/30/20 2355 05/31/20 0541 06/02/20 0240 06/03/20 0500 06/03/20 0755 06/04/20 0515  NA 135   < > 135  --  137 136 137  --  139  K 3.8   < > 4.4   < > 4.7 4.7 5.8* 4.8 4.3  CL 99   < > 98  --  101 101 104  --  101  CO2 24   < > 27  --  27 27 24   --  30  GLUCOSE 237*   < > 234*  --  237* 227* 116*  --  76  BUN 17   < > 24*  --  28* 23* 23*  --  19  CREATININE 0.85   < > 1.01*  --  0.90 0.85 0.77  --  0.90  CALCIUM 8.8*   < > 9.0  --  8.5* 8.5* 8.6*  --  8.5*  MG 2.0  --  2.3  --   --   --   --   --   --   AST 38   < > 36  --  29 23 34  --  27   ALT 42   < > 37  --  27 29 22   --  30  ALKPHOS 69   < > 71  --  70 79 77  --  76  BILITOT 0.8   < > 0.6  --  0.8 1.0 1.6*  --  1.0   < > = values in this interval not displayed.   ------------------------------------------------------------------------------------------------------------------ No results for input(s): CHOL, HDL, LDLCALC, TRIG, CHOLHDL, LDLDIRECT in the last 72 hours.  Lab Results  Component Value Date   HGBA1C 6.9 (H) 05/28/2020   ------------------------------------------------------------------------------------------------------------------ No results for input(s): TSH, T4TOTAL, T3FREE, THYROIDAB in the last 72 hours.  Invalid input(s): FREET3 ------------------------------------------------------------------------------------------------------------------ Recent Labs    06/03/20 0500 06/04/20 0515  FERRITIN 581* 660*    Coagulation profile No results for input(s): INR, PROTIME in the last 168 hours.  Recent Labs    06/03/20 0500 06/04/20 0515  DDIMER 1.51* 1.98*    Cardiac Enzymes No results for input(s): CKMB, TROPONINI, MYOGLOBIN in the last 168 hours.  Invalid input(s): CK ------------------------------------------------------------------------------------------------------------------    Component Value Date/Time   BNP 37.8 06/04/2020 0515    Micro Results Recent Results (from the past 240 hour(s))  Blood Culture (routine x 2)     Status: None   Collection Time: 06/03/2020  8:01 PM   Specimen: BLOOD  Result Value Ref Range Status   Specimen Description BLOOD LEFT ANTECUBITAL  Final   Special Requests   Final    BOTTLES DRAWN AEROBIC AND ANAEROBIC Blood Culture adequate volume   Culture   Final    NO GROWTH 5 DAYS Performed at [facility], [address], [place], [place] [address]    Report Status 06/01/2020 FINAL  Final  SARS Coronavirus 2 by RT PCR (hospital order, performed in [facility] hospital lab) Nasopharyngeal  Nasopharyngeal Swab     Status: Abnormal   Collection Time: 06/03/2020  8:40 PM   Specimen: Nasopharyngeal Swab  Result Value Ref Range Status   SARS Coronavirus 2 POSITIVE (A) NEGATIVE Final    Comment: RESULT CALLED TO, READ BACK BY AND VERIFIED WITH: RN J SAWATZKI @2301  06/16/2020 BY S GEZAHEGN (NOTE) SARS-CoV-2 target nucleic acids are DETECTED  SARS-CoV-2 RNA is generally detectable in upper respiratory specimens  during the acute phase of infection.  Positive results are indicative  of the presence of the identified virus, but do not rule out bacterial infection or co-infection with other pathogens not detected by the test.  Clinical correlation with patient history and  other diagnostic information is necessary to determine patient infection status.  The expected result is negative.  Fact Sheet for Patients:   StrictlyIdeas.no   Fact Sheet for Healthcare Providers:   BankingDealers.co.za    This test is not yet approved or cleared by the [place] FDA and  has been authorized for detection and/or diagnosis of SARS-CoV-2 by FDA under an Emergency Use Authorization (EUA).  This EUA will remain in effect (meaning t his test can be used) for the duration of  the COVID-19 declaration under Section 564(b)(1) of the Act, 21 U.S.C. section 360-bbb-3(b)(1), unless the authorization is terminated or revoked sooner.  Performed at [facility], [place] [address], [place], [place] [address]   Blood Culture (routine x 2)     Status: None   Collection Time: 06/19/2020  8:58 PM   Specimen: BLOOD  Result Value Ref Range Status   Specimen Description BLOOD RIGHT ANTECUBITAL  Final   Special Requests   Final    BOTTLES DRAWN AEROBIC AND ANAEROBIC Blood Culture adequate volume  Culture   Final    NO GROWTH 5 DAYS Performed at [facility], [place] [address] [place], [place] [address]    Report Status 06/01/2020 FINAL  Final  MRSA PCR  Screening     Status: None   Collection Time: 05/29/20  6:35 PM   Specimen: Nasal Mucosa; Nasopharyngeal  Result Value Ref Range Status   MRSA by PCR NEGATIVE NEGATIVE Final    Comment:        The GeneXpert MRSA Assay (FDA approved for NASAL specimens only), is one component of a comprehensive MRSA colonization surveillance program. It is not intended to diagnose MRSA infection nor to guide or monitor treatment for MRSA infections. Performed at [facility], [place] [address] [place], [place] [address]     Radiology Reports DG Chest [place] 1 View  Result Date: 06/03/2020 CLINICAL DATA:  Follow-up COVID-19 pneumonia. EXAM: PORTABLE CHEST 1 VIEW COMPARISON:  06/08/2020 and earlier. FINDINGS: Cardiac silhouette upper normal in size for AP portable technique. Patchy ground-glass opacities throughout the LEFT lung and in the RIGHT mid lung and RIGHT lung base are unchanged since the examination 1 week ago. No new pulmonary parenchymal abnormalities. No visible pleural effusions. IMPRESSION: Stable pneumonia throughout the LEFT lung and in the RIGHT mid lung and RIGHT lung base since the examination 1 week ago. No new abnormalities. Electronically Signed   By: Evangeline Dakin M.D.   On: 06/03/2020 09:28   DG Chest Port 1 View  Result Date: 06/10/2020 CLINICAL DATA:  Cough, COVID, hypoxia EXAM: PORTABLE CHEST 1 VIEW COMPARISON:  11/07/2018 FINDINGS: Lung volumes are small. Bibasilar asymmetric moderate pulmonary infiltrates are present in keeping with atypical infection and compatible with the given history of COVID-19 pneumonia. No pneumothorax or pleural effusion. Cardiac size within normal limits. Pulmonary vascularity is normal. No acute bone abnormality. IMPRESSION: Bibasilar asymmetric moderate pulmonary infiltrates compatible with the given history of COVID-19 pneumonia. Electronically Signed   By: Fidela Salisbury MD   On: 06/06/2020 20:23

## 2020-06-04 NOTE — Progress Notes (Signed)
Physical Therapy Treatment Patient Details Name: Claudia Vega MRN: [id number] DOB: Oct 20, 1960 Today's Date: 06/04/2020    History of Present Illness 59 yo female presenting with shortness of breath. Tested COVID +. PMH including HTN and asthma.     PT Comments    Pt received in bed. Appears flat and less energetic today. Declining amb but agreeable to OOB to chair and exercises. She required supervision bed mobility and transfers. SpO2 > 90% during mobility on 15L HFNC + 15L NRB. RR in 26s. Pt in recliner at end of session.    Follow Up Recommendations  No PT follow up     Equipment Recommendations  None recommended by PT    Recommendations for Other Services       Precautions / Restrictions Precautions Precautions: None    Mobility  Bed Mobility Overal bed mobility: Needs Assistance Bed Mobility: Supine to Sit     Supine to sit: Supervision;HOB elevated     General bed mobility comments: supervision for lines  Transfers Overall transfer level: Needs assistance Equipment used: None Transfers: Sit to/from Stand;Stand Pivot Transfers Sit to Stand: Supervision Stand pivot transfers: Supervision       General transfer comment: Supervision for safety  Ambulation/Gait             General Gait Details: Pt on 15L HFNC + 15L NRB. Pt declining removal of HFNC to attempt amb.   Stairs             Wheelchair Mobility    Modified Rankin (Stroke Patients Only)       Balance Overall balance assessment: No apparent balance deficits (not formally assessed)                                          Cognition Arousal/Alertness: Awake/alert Behavior During Therapy: WFL for tasks assessed/performed;Flat affect Overall Cognitive Status: Within Functional Limits for tasks assessed                                 General Comments: Pt with flat affect and low energy today. Pt stating "I feel old. I usually don't feel like  that."      Exercises General Exercises - Lower Extremity Ankle Circles/Pumps: AROM;Both;10 reps;Seated Long Arc Quad: AROM;Right;Left;10 reps;Seated Hip Flexion/Marching: AROM;Right;Left;20 reps;Standing Heel Raises: AAROM;Both;5 reps;Standing    General Comments General comments (skin integrity, edema, etc.): SpO2 >90% during mobility. RR in 30s      Pertinent Vitals/Pain Pain Assessment: No/denies pain    Home Living                      Prior Function            PT Goals (current goals can now be found in the care plan section) Acute Rehab PT Goals Patient Stated Goal: return home Progress towards PT goals: Progressing toward goals    Frequency    Min 3X/week      PT Plan Current plan remains appropriate    Co-evaluation              AM-PAC PT "6 Clicks" Mobility   Outcome Measure  Help needed turning from your back to your side while in a flat bed without using bedrails?: None Help needed moving from lying on your back to sitting on  the side of a flat bed without using bedrails?: None Help needed moving to and from a bed to a chair (including a wheelchair)?: None Help needed standing up from a chair using your arms (e.g., wheelchair or bedside chair)?: None Help needed to walk in hospital room?: A Little Help needed climbing 3-5 steps with a railing? : A Little 6 Click Score: 22    End of Session Equipment Utilized During Treatment: Oxygen Activity Tolerance: Patient limited by fatigue;Treatment limited secondary to medical complications (Comment) (high O2 needs) Patient left: in chair;with call bell/phone within reach Nurse Communication: Mobility status PT Visit Diagnosis: Other abnormalities of gait and mobility (R26.89)     Time: 1950-9326 PT Time Calculation (min) (ACUTE ONLY): 18 min  Charges:  $Therapeutic Exercise: 8-22 mins                     Lorrin Goodell, PT  Office # [phone] Pager [phone]    [name] 06/04/2020, 11:41 AM

## 2020-06-04 NOTE — Progress Notes (Signed)
Occupational Therapy Treatment Patient Details Name: Claudia Vega MRN: [id number] DOB: June 21, 1961 Today's Date: 06/04/2020    History of present illness 59 yo female presenting with shortness of breath. Tested COVID +. PMH including HTN and asthma.    OT comments  Pt continues to present with poor activity tolerance. Focused session on theraband exercises in sitting. Pt performing horizontal shoulder adduction/abduction and elbow flexion; fatigues quickly and requiring several rest breaks. Pt doffing NRB to wash her face and SpO2 dropping to 77% from 80s. Pt practicing purse lip breathing and able to elevate SpO2 to 93% with 15L HFNC and 15L NRB. Pt verbalized concern about her medical status. Despite fatigue, pt very motivated. Continue to recommend dc to home with HHOT and will continue to follow acutely as admitted.   SpO2 93-77% on 15L HFNC and NRB. RR 20-40s.    Follow Up Recommendations  Home health OT (May need post acute rehab)    Equipment Recommendations  3 in 1 bedside commode    Recommendations for Other Services PT consult    Precautions / Restrictions Precautions Precautions: None Precaution Comments: 15L HFNC and 15L NRB       Mobility Bed Mobility               General bed mobility comments: In recliner upon arrival  Transfers                      Balance                                           ADL either performed or assessed with clinical judgement   ADL Overall ADL's : Needs assistance/impaired     Grooming: Wash/dry face;Sitting;Supervision/safety Grooming Details (indicate cue type and reason): Pt taking NRB off to wash her face and SpO2 dropping to 77%                               General ADL Comments: Pt presenting with poor activity tolerance. SpO2 dropping to 77% on 15L via HFNC only and requiring 15L via NRB in addition to return to 80s. Focused on theraband exercises.     Vision        Perception     Praxis      Cognition Arousal/Alertness: Awake/alert Behavior During Therapy: WFL for tasks assessed/performed;Flat affect Overall Cognitive Status: Within Functional Limits for tasks assessed                                 General Comments: Pt more fatigued this session. Pt verbalizing she feels overwhelmed. Pt stating "The doctor says I have plateaued. I don't want ot die. I am not ready." Cotinues to be highly motivated        Exercises Exercises: General Upper Extremity General Exercises - Upper Extremity Shoulder Horizontal ABduction: Strengthening;10 reps;Seated;Theraband;Both Theraband Level (Shoulder Horizontal Abduction): Level 1 (Yellow) Shoulder Horizontal ADduction: Strengthening;10 reps;Seated;Theraband;Both Theraband Level (Shoulder Horizontal Adduction): Level 1 (Yellow) Elbow Flexion: Strengthening;5 reps;Both;Seated;Theraband Theraband Level (Elbow Flexion): Level 1 (Yellow) General Exercises - Lower Extremity Long Arc Quad: AROM;10 reps;Seated   Shoulder Instructions       General Comments SpO2 dropping to 77% with 15L HFNC only; requiring NRB to return to 80s-90s. At  rest, pt SpO2 low-80s; able to elevate to 88-91% with cues for purse lip breathing. RR 20-30s.    Pertinent Vitals/ Pain       Pain Assessment: No/denies pain  Home Living                                          Prior Functioning/Environment              Frequency  Min 2X/week        Progress Toward Goals  OT Goals(current goals can now be found in the care plan section)  Progress towards OT goals: Progressing toward goals  Acute Rehab OT Goals Patient Stated Goal: return home OT Goal Formulation: With patient Time For Goal Achievement: 06/14/20 Potential to Achieve Goals: Good ADL Goals Pt Will Perform Grooming: with modified independence;standing;sitting Pt Will Perform Lower Body Dressing: with modified independence;sit  to/from stand Pt Will Transfer to Toilet: with modified independence;ambulating;bedside commode Pt Will Perform Toileting - Clothing Manipulation and hygiene: with modified independence;sitting/lateral leans;sit to/from stand Additional ADL Goal #1: Pt will independently verbalize three energy conservation techniques for ADLs and IADLs Additional ADL Goal #2: Pt will independently monitor SpO2 and use purse lip breathing for ADLs  Plan Discharge plan remains appropriate    Co-evaluation                 AM-PAC OT "6 Clicks" Daily Activity     Outcome Measure   Help from another person eating meals?: A Little Help from another person taking care of personal grooming?: A Little Help from another person toileting, which includes using toliet, bedpan, or urinal?: A Lot Help from another person bathing (including washing, rinsing, drying)?: A Lot Help from another person to put on and taking off regular upper body clothing?: A Lot Help from another person to put on and taking off regular lower body clothing?: A Lot 6 Click Score: 14    End of Session Equipment Utilized During Treatment: Oxygen  OT Visit Diagnosis: Unsteadiness on feet (R26.81);Other abnormalities of gait and mobility (R26.89);Muscle weakness (generalized) (M62.81)   Activity Tolerance Patient limited by fatigue   Patient Left in chair;with call bell/phone within reach   Nurse Communication Mobility status        Time: 1354-1440 OT Time Calculation (min): 46 min  Charges: OT General Charges $OT Visit: 1 Visit OT Treatments $Self Care/Home Management : 8-22 mins $Therapeutic Exercise: 23-37 mins  [place], OTR/L Acute Rehab Pager: [phone] Office: [place] 06/04/2020, 5:10 PM

## 2020-06-05 ENCOUNTER — Inpatient Hospital Stay (HOSPITAL_COMMUNITY): Payer: 59

## 2020-06-05 LAB — CBC
HCT: 43.7 % (ref 36.0–46.0)
Hemoglobin: 14.4 g/dL (ref 12.0–15.0)
MCH: 29.7 pg (ref 26.0–34.0)
MCHC: 33 g/dL (ref 30.0–36.0)
MCV: 90.1 fL (ref 80.0–100.0)
Platelets: 346 10*3/uL (ref 150–400)
RBC: 4.85 MIL/uL (ref 3.87–5.11)
RDW: 12.3 % (ref 11.5–15.5)
WBC: 12.5 10*3/uL — ABNORMAL HIGH (ref 4.0–10.5)
nRBC: 0 % (ref 0.0–0.2)

## 2020-06-05 LAB — COMPREHENSIVE METABOLIC PANEL
ALT: 25 U/L (ref 0–44)
AST: 21 U/L (ref 15–41)
Albumin: 2.5 g/dL — ABNORMAL LOW (ref 3.5–5.0)
Alkaline Phosphatase: 79 U/L (ref 38–126)
Anion gap: 10 (ref 5–15)
BUN: 19 mg/dL (ref 6–20)
CO2: 28 mmol/L (ref 22–32)
Calcium: 8.8 mg/dL — ABNORMAL LOW (ref 8.9–10.3)
Chloride: 98 mmol/L (ref 98–111)
Creatinine, Ser: 0.86 mg/dL (ref 0.44–1.00)
GFR calc Af Amer: >60 mL/min (ref >60)
GFR calc non Af Amer: >60 mL/min (ref >60)
Glucose, Bld: 165 mg/dL — ABNORMAL HIGH (ref 70–99)
Potassium: 4.8 mmol/L (ref 3.5–5.1)
Sodium: 136 mmol/L (ref 135–145)
Total Bilirubin: 1.1 mg/dL (ref 0.3–1.2)
Total Protein: 5.9 g/dL — ABNORMAL LOW (ref 6.5–8.1)

## 2020-06-05 LAB — GLUCOSE, CAPILLARY
Glucose-Capillary: 145 mg/dL — ABNORMAL HIGH (ref 70–99)
Glucose-Capillary: 163 mg/dL — ABNORMAL HIGH (ref 70–99)
Glucose-Capillary: 228 mg/dL — ABNORMAL HIGH (ref 70–99)
Glucose-Capillary: 151 mg/dL — ABNORMAL HIGH (ref 70–99)

## 2020-06-05 LAB — FERRITIN: Ferritin: 768 ng/mL — ABNORMAL HIGH (ref 11–307)

## 2020-06-05 LAB — C-REACTIVE PROTEIN: CRP: 8.2 mg/dL — ABNORMAL HIGH (ref <1.0)

## 2020-06-05 LAB — D-DIMER, QUANTITATIVE: D-Dimer, Quant: 1.85 ug/mL-FEU — ABNORMAL HIGH (ref 0.00–0.50)

## 2020-06-05 MED ORDER — FUROSEMIDE 10 MG/ML IJ SOLN: 40.0000 mg | Freq: Once | INTRAMUSCULAR | Status: DC

## 2020-06-05 NOTE — TOC Progression Note (Signed)
Transition of Care [facility]) - Progression Note    Patient Details  Name: DARETH ANDREW MRN: [id number] Date of Birth: [birth year]  [facility] of Care [facility]) CM/SW [place], RN Phone Number: 06/05/2020, 4:09 PM  Clinical Narrative:    Patient still on 15L or oxygen 40% PT and OT now recommending HH. Will  Follow for needs EDD 3 days.    Expected Discharge Plan: [place] Barriers to Discharge: Continued Medical Work up  Expected Discharge Plan and Services Expected Discharge Plan: [place]   Discharge Planning Services: CM Consult   Living arrangements for the past 2 months: Single Family Home                                       Social Determinants of Health (SDOH) Interventions    Readmission Risk Interventions No flowsheet data found.

## 2020-06-05 NOTE — Progress Notes (Addendum)
Physical Therapy Treatment Patient Details Name: Claudia Vega MRN: [id number] DOB: 05-Jul-1961 Today's Date: 06/05/2020    History of Present Illness Pt is a 59 y.o. female presenting 05/25/2020 with shortness of breath. Tested COVID +. Workup for acute hypoxic respiratory failure secondary to COVID-19 viral PNA. PMH including HTN, asthma.   PT Comments    Pt slowly progressing with mobility, now on 30L O2 HHFNC at 100% FiO2 + 15L O2 NRB. Performed multiple short bouts of standing activity; SpO2 stable at 88-93% but pt quick to become SOB requiring multiple rest breaks after minimal activity. Pt limited by generalized weakness and decreased activity tolerance. Recommend follow-up with HHPT services to maximize functional mobility and independence.    Follow Up Recommendations  Home health PT     Equipment Recommendations   (TBD)    Recommendations for Other Services       Precautions / Restrictions Precautions Precautions: Other (comment) Precaution Comments: 15L HFNC and 15L NRB Restrictions Weight Bearing Restrictions: No    Mobility  Bed Mobility               General bed mobility comments: In recliner upon arrival  Transfers Overall transfer level: Needs assistance Equipment used: None Transfers: Sit to/from Stand Sit to Stand: Supervision         General transfer comment: No assist required, supervision for safety with lines; pt demonstrates good line management overall  Ambulation/Gait Ambulation/Gait assistance: Supervision Gait Distance (Feet): 6 Feet Assistive device: None Gait Pattern/deviations: Step-through pattern;Decreased stride length Gait velocity: Decreased   General Gait Details: Steps forwards/backwards within confines of HHFNC/NRB lines   Stairs             Wheelchair Mobility    Modified Rankin (Stroke Patients Only)       Balance Overall balance assessment: No apparent balance deficits (not formally assessed)                                           Cognition Arousal/Alertness: Awake/alert Behavior During Therapy: WFL for tasks assessed/performed;Flat affect Overall Cognitive Status: Within Functional Limits for tasks assessed                                        Exercises Other Exercises Other Exercises: Bouts of marching in place; 5x sit<>stand (without UE support); partial squats x8; IS x10 (puilling 500-750 mL) -- 2-3+ min seated rest between activity bouts    General Comments General comments (skin integrity, edema, etc.): SpO2 88-93% on 30L O2 HHFNC at 100% FiO2 + 15L NRB. Pt reports, "I overdid it yesterday and they couldn't get my oxygen back up" - reports doing no exercise today besides sitting in recliner; educ on importance of mobility, therex frequency (at least some form of mobility every hour), IS frequency. Energy conservation handout provided      Pertinent Vitals/Pain Pain Assessment: No/denies pain    Home Living                      Prior Function            PT Goals (current goals can now be found in the care plan section) Progress towards PT goals: Progressing toward goals    Frequency    Min 3X/week  PT Plan Discharge plan needs to be updated    Co-evaluation              AM-PAC PT "6 Clicks" Mobility   Outcome Measure  Help needed turning from your back to your side while in a flat bed without using bedrails?: None Help needed moving from lying on your back to sitting on the side of a flat bed without using bedrails?: None Help needed moving to and from a bed to a chair (including a wheelchair)?: None Help needed standing up from a chair using your arms (e.g., wheelchair or bedside chair)?: None Help needed to walk in hospital room?: A Little Help needed climbing 3-5 steps with a railing? : A Little 6 Click Score: 22    End of Session Equipment Utilized During Treatment: Oxygen Activity Tolerance:  Patient tolerated treatment well;Patient limited by fatigue Patient left: in chair;with call bell/phone within reach Nurse Communication: Mobility status PT Visit Diagnosis: Other abnormalities of gait and mobility (R26.89)     Time: 1017-5102 PT Time Calculation (min) (ACUTE ONLY): 25 min  Charges:  $Therapeutic Exercise: 8-22 mins $Therapeutic Activity: 8-22 mins                     Mabeline Caras, PT, DPT Acute Rehabilitation Services  Pager [phone] Office [place] 06/05/2020, 3:35 PM

## 2020-06-05 NOTE — Progress Notes (Signed)
PROGRESS NOTE                                                                                                                                                                                                             Patient Demographics:    Claudia Vega, is a 59 y.o. female, DOB - 10/07/1960, CHY:[id number]  Outpatient Primary MD for the patient is Greig Right, MD   Admit date - 05/30/2020   LOS - 9  Chief Complaint  Patient presents with   Shortness of Breath   Cough       Brief Narrative: Patient is a 59 y.o. female with PMHx of HTN, asthma-who tested positive for COVID-19 on 9/5-admitted for worsening shortness of breath due to acute hypoxic respiratory failure requiring HFNC secondary to COVID-19 pneumonia  COVID-19 vaccinated status: Unvaccinated  Significant Events: 9/5>> Admit to [facility] for hypoxia due to COVID-19  Significant studies: 9/5>>Chest x-ray: Patchy bilateral infiltrates  COVID-19 medications: Steroids:9/5>> Remdesivir:9/5>>9/9 Baricitinib:9/5>>  Antibiotics: None  Microbiology data: 9/5>>Blood culture:no growth  Procedures: None  Consults: None  DVT prophylaxis: Prophylactic Lovenox at intermediate-twice daily dosing.   Subjective:    Claudia Vega today still reports dyspnea, she had increased oxygen requirement yesterday, she denies any chest pain, fever or chills .    Assessment  & Plan :   Acute Hypoxic Resp Failure due to Covid 19 Viral pneumonia:  -Respiratory status remains tenuous and critical, she is with severe hypoxemia, she had increased oxygen requirement over the last 24 hours, she is currently on 30 L heated high flow and 15 L NRB .  -Continue with steroids -Continue with baricitinib. -Treated with IV remdesivir. -Diuresis as needed, blood pressure is soft, as well BNP within normal limit, so we will hold on any Lasix today. -Continue to trend  inflammatory markers. -I have discussed at length with the patient, encouraged her to keep using incentive spirometry, flutter valve, out of bed to chair and to prone.   -D-dimer is elevated at 1.8, continue with Lovenox 0.5 mg/kg every 12 hours. -Repeated chest x-ray today giving increased oxygen requirement, no significant changes from previous. -Lasix as needed.  Fever: afebrile O2 requirements:  SpO2: 90 % O2 Flow Rate (L/min): (S) 30 L/min FiO2 (%): 100 %   COVID-19 Labs: Recent Labs    06/03/20 0500 06/04/20 0515  06/05/20 0526  DDIMER 1.51* 1.98* 1.85*  FERRITIN 581* 660* 768*  CRP 0.6 7.9* 8.2*       Component Value Date/Time   BNP 37.8 06/04/2020 0515    Recent Labs  Lab 06/04/20 0515  PROCALCITON <0.10    Lab Results  Component Value Date   SARSCOV2NAA POSITIVE (A) 06/05/2020     Prone/Incentive Spirometry: encouraged patient to lie prone for 3-4 hours at a time for a total of 16 hours a day, and to encourage incentive spirometry use 3-4/hour.  DM-2 (A1c 6.9 on 9/6) with steroid-induced hyperglycemia: -Her CBG was low this morning, I have stopped NovoLog before meals, continue with Lantus and sliding scale.    Recent Labs    06/04/20 2051 06/05/20 0728 06/05/20 1129  GLUCAP 186* 145* 163*    HTN:  -Cardizem has been stopped given soft blood pressure.  Bronchial asthma: No wheezing-stable-continue inhaler regimen  Morbid Obesity: Estimated body mass index is 38.56 kg/m as calculated from the following:   Height as of 05/24/20: 4' 11.9" (1.521 m).   Weight as of 05/24/20: 89.3 kg.    GI prophylaxis: PPI  ABG: No results found for: PHART, PCO2ART, PO2ART, HCO3, TCO2, ACIDBASEDEF, O2SAT  Vent Settings: N/A  Condition - Extremely Guarded  Family Communication  :  Spouse Cansler [phone])  updated over the phone daily  Code Status :  Full Code  Diet :  Diet Order            Diet Carb Modified Fluid consistency: Thin; Room service  appropriate? Yes  Diet effective now                  Disposition Plan  :   Status is: Inpatient  Remains inpatient appropriate because:Inpatient level of care appropriate due to severity of illness   Dispo: The patient is from: Home              Anticipated d/c is to: Home              Anticipated d/c date is: > 3 days              Patient currently is not medically stable to d/c.   Barriers to discharge: Hypoxia requiring O2 supplementation/complete 5 days of IV Remdesivir  Antimicorbials  :    Anti-infectives (From admission, onward)   Start     Dose/Rate Route Frequency Ordered Stop   05/28/20 1000  remdesivir 100 mg in sodium chloride 0.9 % 100 mL IVPB       "Followed by" Linked Group Details   100 mg 200 mL/hr over 30 Minutes Intravenous Every 24 hours 06/18/2020 2038 05/31/20 0920   06/08/2020 2100  remdesivir 200 mg in sodium chloride 0.9% 250 mL IVPB       "Followed by" Linked Group Details   200 mg 580 mL/hr over 30 Minutes Intravenous Once 06/20/2020 2038 06/10/2020 2225      Inpatient Medications  Scheduled Meds:  vitamin C  500 mg Oral Daily   baricitinib  4 mg Oral QHS   buPROPion  150 mg Oral q AM   enoxaparin (LOVENOX) injection  40 mg Subcutaneous Q12H   insulin aspart  0-15 Units Subcutaneous TID WC   insulin aspart  0-5 Units Subcutaneous QHS   insulin aspart  5 Units Subcutaneous TID WC   insulin glargine  10 Units Subcutaneous BID   Ipratropium-Albuterol  1 puff Inhalation Q6H   linagliptin  5 mg  Oral Daily   methylPREDNISolone (SOLU-MEDROL) injection  40 mg Intravenous Q12H   mometasone-formoterol  2 puff Inhalation BID   montelukast  10 mg Oral QHS   pantoprazole  40 mg Oral Daily   pramipexole  1 mg Oral QHS   senna-docusate  2 tablet Oral BID   zinc sulfate  220 mg Oral Daily   Continuous Infusions: PRN Meds:.acetaminophen, albuterol, benzonatate, guaiFENesin-dextromethorphan, ondansetron **OR** ondansetron (ZOFRAN) IV,  polyethylene glycol, senna-docusate, sodium chloride   Time Spent in minutes  35   See all Orders from today for further details   Phillips Climes M.D on 06/05/2020 at 1:55 PM  To page go to [email or web address] - use universal password  [facility] -  Office  [phone]    Objective:   Vitals:   06/05/20 0107 06/05/20 0523 06/05/20 0729 06/05/20 1134  BP: 107/68 107/65 102/68 98/65  Pulse: 69 85 96 86  Resp: 20 (!) 25 20 20   Temp: 98.3 F (36.8 C) 98 F (36.7 C) 97.7 F (36.5 C) 97.8 F (36.6 C)  TempSrc: Axillary Axillary Axillary Axillary  SpO2: 100% (!) 81% (!) 85% 90%    Wt Readings from Last 3 Encounters:  05/24/20 89.3 kg  12/01/19 91.6 kg  11/07/18 88 kg     Intake/Output Summary (Last 24 hours) at 06/05/2020 1355 Last data filed at 06/05/2020 1200 Gross per 24 hour  Intake 240 ml  Output --  Net 240 ml     Physical Exam  Awake Alert, Oriented X 3, No new F.N deficits, Normal affect Symmetrical Chest wall movement, Good air movement bilaterally, she has left lung rales. RRR,No Gallops,Rubs or new Murmurs, No Parasternal Heave +ve B.Sounds, Abd Soft, No tenderness, No rebound - guarding or rigidity. No Cyanosis, Clubbing or edema, No new Rash or bruise       Data Review:    CBC Recent Labs  Lab 05/30/20 0632 06/03/20 0500 06/04/20 0515 06/05/20 0526  WBC 8.5 13.7* 14.1* 12.5*  HGB 14.4 14.3 14.2 14.4  HCT 44.3 44.8 43.7 43.7  PLT 213 261 341 346  MCV 89.3 90.1 89.0 90.1  MCH 29.0 28.8 28.9 29.7  MCHC 32.5 31.9 32.5 33.0  RDW 12.8 12.4 12.3 12.3  LYMPHSABS 1.1  --   --   --   MONOABS 0.5  --   --   --   EOSABS 0.0  --   --   --   BASOSABS 0.0  --   --   --     Chemistries  Recent Labs  Lab 05/30/20 0632 05/30/20 9937 05/31/20 0541 05/31/20 0541 06/02/20 0240 06/03/20 0500 06/03/20 0755 06/04/20 0515 06/05/20 0526  NA 135   < > 137  --  136 137  --  139 136  K 4.4   < > 4.7   < > 4.7 5.8* 4.8 4.3 4.8  CL 98   < > 101   --  101 104  --  101 98  CO2 27   < > 27  --  27 24  --  30 28  GLUCOSE 234*   < > 237*  --  227* 116*  --  76 165*  BUN 24*   < > 28*  --  23* 23*  --  19 19  CREATININE 1.01*   < > 0.90  --  0.85 0.77  --  0.90 0.86  CALCIUM 9.0   < > 8.5*  --  8.5* 8.6*  --  8.5*  8.8*  MG 2.3  --   --   --   --   --   --   --   --   AST 36   < > 29  --  23 34  --  27 21  ALT 37   < > 27  --  29 22  --  30 25  ALKPHOS 71   < > 70  --  79 77  --  76 79  BILITOT 0.6   < > 0.8  --  1.0 1.6*  --  1.0 1.1   < > = values in this interval not displayed.   ------------------------------------------------------------------------------------------------------------------ No results for input(s): CHOL, HDL, LDLCALC, TRIG, CHOLHDL, LDLDIRECT in the last 72 hours.  Lab Results  Component Value Date   HGBA1C 6.9 (H) 05/28/2020   ------------------------------------------------------------------------------------------------------------------ No results for input(s): TSH, T4TOTAL, T3FREE, THYROIDAB in the last 72 hours.  Invalid input(s): FREET3 ------------------------------------------------------------------------------------------------------------------ Recent Labs    06/04/20 0515 06/05/20 0526  FERRITIN 660* 768*    Coagulation profile No results for input(s): INR, PROTIME in the last 168 hours.  Recent Labs    06/04/20 0515 06/05/20 0526  DDIMER 1.98* 1.85*    Cardiac Enzymes No results for input(s): CKMB, TROPONINI, MYOGLOBIN in the last 168 hours.  Invalid input(s): CK ------------------------------------------------------------------------------------------------------------------    Component Value Date/Time   BNP 37.8 06/04/2020 0515    Micro Results Recent Results (from the past 240 hour(s))  Blood Culture (routine x 2)     Status: None   Collection Time: 05/30/2020  8:01 PM   Specimen: BLOOD  Result Value Ref Range Status   Specimen Description BLOOD LEFT ANTECUBITAL  Final     Special Requests   Final    BOTTLES DRAWN AEROBIC AND ANAEROBIC Blood Culture adequate volume   Culture   Final    NO GROWTH 5 DAYS Performed at [facility], [address], [place], [place] [address]    Report Status 06/01/2020 FINAL  Final  SARS Coronavirus 2 by RT PCR (hospital order, performed in [facility] hospital lab) Nasopharyngeal Nasopharyngeal Swab     Status: Abnormal   Collection Time: 06/15/2020  8:40 PM   Specimen: Nasopharyngeal Swab  Result Value Ref Range Status   SARS Coronavirus 2 POSITIVE (A) NEGATIVE Final    Comment: RESULT CALLED TO, READ BACK BY AND VERIFIED WITH: RN J SAWATZKI @2301  05/26/2020 BY S GEZAHEGN (NOTE) SARS-CoV-2 target nucleic acids are DETECTED  SARS-CoV-2 RNA is generally detectable in upper respiratory specimens  during the acute phase of infection.  Positive results are indicative  of the presence of the identified virus, but do not rule out bacterial infection or co-infection with other pathogens not detected by the test.  Clinical correlation with patient history and  other diagnostic information is necessary to determine patient infection status.  The expected result is negative.  Fact Sheet for Patients:   StrictlyIdeas.no   Fact Sheet for Healthcare Providers:   BankingDealers.co.za    This test is not yet approved or cleared by the [place] FDA and  has been authorized for detection and/or diagnosis of SARS-CoV-2 by FDA under an Emergency Use Authorization (EUA).  This EUA will remain in effect (meaning t his test can be used) for the duration of  the COVID-19 declaration under Section 564(b)(1) of the Act, 21 U.S.C. section 360-bbb-3(b)(1), unless the authorization is terminated or revoked sooner.  Performed at [facility], [place]  [address], [place], [place] [address]   Blood Culture (routine x 2)     Status: None   Collection Time: 06/01/2020  8:58 PM   Specimen:  BLOOD  Result Value Ref Range Status   Specimen Description BLOOD RIGHT ANTECUBITAL  Final   Special Requests   Final    BOTTLES DRAWN AEROBIC AND ANAEROBIC Blood Culture adequate volume   Culture   Final    NO GROWTH 5 DAYS Performed at [facility], [place] [address] [place], [place] [address]    Report Status 06/01/2020 FINAL  Final  MRSA PCR Screening     Status: None   Collection Time: 05/29/20  6:35 PM   Specimen: Nasal Mucosa; Nasopharyngeal  Result Value Ref Range Status   MRSA by PCR NEGATIVE NEGATIVE Final    Comment:        The GeneXpert MRSA Assay (FDA approved for NASAL specimens only), is one component of a comprehensive MRSA colonization surveillance program. It is not intended to diagnose MRSA infection nor to guide or monitor treatment for MRSA infections. Performed at [facility], [place] [address], [place], [place] [address]     Radiology Reports DG Chest [place] 1 View  Result Date: 06/05/2020 CLINICAL DATA:  COVID, hypoxia, shortness of breath EXAM: PORTABLE CHEST 1 VIEW COMPARISON:  06/03/2020 FINDINGS: Improving aeration bilaterally. Mild residual interstitial prominence and airspace disease bilaterally, left greater than right. Low lung volumes. Heart is borderline in size. No effusions or pneumothorax. IMPRESSION: Improving aeration with continued mild interstitial and alveolar opacities, left greater than right. Electronically Signed   By: Rolm Baptise M.D.   On: 06/05/2020 08:43   DG Chest Port 1 View  Result Date: 06/03/2020 CLINICAL DATA:  Follow-up COVID-19 pneumonia. EXAM: PORTABLE CHEST 1 VIEW COMPARISON:  05/23/2020 and earlier. FINDINGS: Cardiac silhouette upper normal in size for AP portable technique. Patchy ground-glass opacities throughout the LEFT lung and in the RIGHT mid lung and RIGHT lung base are unchanged since the examination 1 week ago. No new pulmonary parenchymal abnormalities. No visible pleural effusions. IMPRESSION: Stable  pneumonia throughout the LEFT lung and in the RIGHT mid lung and RIGHT lung base since the examination 1 week ago. No new abnormalities. Electronically Signed   By: Evangeline Dakin M.D.   On: 06/03/2020 09:28   DG Chest Port 1 View  Result Date: 05/29/2020 CLINICAL DATA:  Cough, COVID, hypoxia EXAM: PORTABLE CHEST 1 VIEW COMPARISON:  11/07/2018 FINDINGS: Lung volumes are small. Bibasilar asymmetric moderate pulmonary infiltrates are present in keeping with atypical infection and compatible with the given history of COVID-19 pneumonia. No pneumothorax or pleural effusion. Cardiac size within normal limits. Pulmonary vascularity is normal. No acute bone abnormality. IMPRESSION: Bibasilar asymmetric moderate pulmonary infiltrates compatible with the given history of COVID-19 pneumonia. Electronically Signed   By: Fidela Salisbury MD   On: 06/11/2020 20:23

## 2020-06-06 ENCOUNTER — Inpatient Hospital Stay (HOSPITAL_COMMUNITY): Payer: 59

## 2020-06-06 DIAGNOSIS — R7989 Other specified abnormal findings of blood chemistry: Secondary | ICD-10-CM

## 2020-06-06 LAB — COMPREHENSIVE METABOLIC PANEL
ALT: 23 U/L (ref 0–44)
AST: 21 U/L (ref 15–41)
Albumin: 2.5 g/dL — ABNORMAL LOW (ref 3.5–5.0)
Alkaline Phosphatase: 75 U/L (ref 38–126)
Anion gap: 9 (ref 5–15)
BUN: 25 mg/dL — ABNORMAL HIGH (ref 6–20)
CO2: 28 mmol/L (ref 22–32)
Calcium: 8.8 mg/dL — ABNORMAL LOW (ref 8.9–10.3)
Chloride: 100 mmol/L (ref 98–111)
Creatinine, Ser: 0.99 mg/dL (ref 0.44–1.00)
GFR calc Af Amer: >60 mL/min (ref >60)
GFR calc non Af Amer: >60 mL/min (ref >60)
Glucose, Bld: 200 mg/dL — ABNORMAL HIGH (ref 70–99)
Potassium: 4.4 mmol/L (ref 3.5–5.1)
Sodium: 137 mmol/L (ref 135–145)
Total Bilirubin: 0.6 mg/dL (ref 0.3–1.2)
Total Protein: 6.1 g/dL — ABNORMAL LOW (ref 6.5–8.1)

## 2020-06-06 LAB — GLUCOSE, CAPILLARY
Glucose-Capillary: 191 mg/dL — ABNORMAL HIGH (ref 70–99)
Glucose-Capillary: 122 mg/dL — ABNORMAL HIGH (ref 70–99)
Glucose-Capillary: 218 mg/dL — ABNORMAL HIGH (ref 70–99)
Glucose-Capillary: 75 mg/dL (ref 70–99)
Glucose-Capillary: 91 mg/dL (ref 70–99)

## 2020-06-06 LAB — CBC
HCT: 43.3 % (ref 36.0–46.0)
Hemoglobin: 14.2 g/dL (ref 12.0–15.0)
MCH: 29.6 pg (ref 26.0–34.0)
MCHC: 32.8 g/dL (ref 30.0–36.0)
MCV: 90.4 fL (ref 80.0–100.0)
Platelets: 394 10*3/uL (ref 150–400)
RBC: 4.79 MIL/uL (ref 3.87–5.11)
RDW: 12.5 % (ref 11.5–15.5)
WBC: 14.3 10*3/uL — ABNORMAL HIGH (ref 4.0–10.5)
nRBC: 0 % (ref 0.0–0.2)

## 2020-06-06 LAB — C-REACTIVE PROTEIN: CRP: 3.4 mg/dL — ABNORMAL HIGH (ref <1.0)

## 2020-06-06 LAB — FERRITIN: Ferritin: 685 ng/mL — ABNORMAL HIGH (ref 11–307)

## 2020-06-06 LAB — D-DIMER, QUANTITATIVE: D-Dimer, Quant: 1.54 ug/mL-FEU — ABNORMAL HIGH (ref 0.00–0.50)

## 2020-06-06 MED ORDER — IOHEXOL 350 MG/ML SOLN
80.0000 mL | Freq: Once | INTRAVENOUS | Status: AC | PRN
  Administered 2020-06-06: 80 mL via INTRAVENOUS

## 2020-06-06 NOTE — Progress Notes (Signed)
PROGRESS NOTE                                                                                                                                                                                                             Patient Demographics:    Claudia Vega, is a 59 y.o. female, DOB - [birth year], QIW:[id number]  Outpatient Primary MD for the patient is Greig Right, MD   Admit date - 06/18/2020   LOS - 10  Chief Complaint  Patient presents with  . Shortness of Breath  . Cough       Brief Narrative: Patient is a 59 y.o. female with PMHx of HTN, asthma-who tested positive for COVID-19 on 9/5-admitted for worsening shortness of breath due to acute hypoxic respiratory failure requiring HFNC secondary to COVID-19 pneumonia  COVID-19 vaccinated status: Unvaccinated  Significant Events: 9/5>> Admit to [facility] for hypoxia due to COVID-19  Significant studies: 9/5>>Chest x-ray: Patchy bilateral infiltrates 9/12>>> chest x-ray: Stable pneumonia throughout left lung and right midlung 9/14>> chest x-ray: Improving aeration with continued mild interstitial and alveolar opacities-left greater than right  COVID-19 medications: Steroids:9/5>> Remdesivir:9/5>>9/9 Baricitinib:9/5>>  Antibiotics: None  Microbiology data: 9/5>>Blood culture:no growth  Procedures: None  Consults: None  DVT prophylaxis: Prophylactic Lovenox at intermediate-twice daily dosing.   Subjective:   She remains essentially the same-stable at rest but on heated high flow and NRB.   Assessment  & Plan :   Acute Hypoxic Resp Failure due to Covid 19 Viral pneumonia: Continues to have severe hypoxemia-on heated high flow and NRB.  Looks comfortable-completed a course of remdesivir.  Remains on steroids and baricitinib.  Given gradually worsening hypoxemia over the past few days-now requiring heated high flow (previously was on salter high flow)-we  will go ahead and check a CTA chest to rule out PE.  No signs of volume overload-continue to monitor closely-if she deteriorates any further-we will require transfer to the ICU.  Fever: afebrile O2 requirements:  SpO2: (!) 80 % O2 Flow Rate (L/min): 40 L/min FiO2 (%): 100 %   COVID-19 Labs: Recent Labs    06/04/20 0515 06/05/20 0526 06/06/20 0750  DDIMER 1.98* 1.85* 1.54*  FERRITIN 660* 768* 685*  CRP 7.9* 8.2* 3.4*       Component Value Date/Time   BNP 37.8 06/04/2020 0515    Recent Labs  Lab 06/04/20 0515  PROCALCITON <0.10    Lab Results  Component Value Date   SARSCOV2NAA POSITIVE (A) 05/26/2020     Prone/Incentive Spirometry: encouraged patient to lie prone for 3-4 hours at a time for a total of 16 hours a day, and to encourage incentive spirometry use 3-4/hour.  DM-2 (A1c 6.9 on 9/6) with steroid-induced hyperglycemia: CBGs relatively stable but still on the higher side-continue Lantus to 10 units twice daily, continue 5 units of NovoLog with meals and SSI.  Follow and adjust accordingly  Recent Labs    06/05/20 1541 06/05/20 2121 06/06/20 0723  GLUCAP 228* 151* 191*    HTN: BP soft-no longer on Cardizem.  Resume when able.  Bronchial asthma: No wheezing-stable-continue inhaler regimen  Morbid Obesity: Estimated body mass index is 38.56 kg/m as calculated from the following:   Height as of 05/24/20: 4' 11.9" (1.521 m).   Weight as of 05/24/20: 89.3 kg.    GI prophylaxis: PPI  ABG: No results found for: PHART, PCO2ART, PO2ART, HCO3, TCO2, ACIDBASEDEF, O2SAT  Vent Settings: N/A  Condition - Extremely Guarded  Family Communication  :  Spouse Neuner [phone])  updated over the phone 9/15 (spouse inquiring about ivermectin-explained that this is not in the hospital protocol-that I had no experience with ivermectin use with Covid-he is already aware that the medical/scientific community recommends against ivermectin use)  Code Status :  Full  Code  Diet :  Diet Order            Diet Carb Modified Fluid consistency: Thin; Room service appropriate? Yes  Diet effective now                  Disposition Plan  :   Status is: Inpatient  Remains inpatient appropriate because:Inpatient level of care appropriate due to severity of illness  Dispo: The patient is from: Home              Anticipated d/c is to: Home              Anticipated d/c date is: > 3 days              Patient currently is not medically stable to d/c.   Barriers to discharge: Hypoxia requiring O2 supplementation/complete 5 days of IV Remdesivir  Antimicorbials  :    Anti-infectives (From admission, onward)   Start     Dose/Rate Route Frequency Ordered Stop   05/28/20 1000  remdesivir 100 mg in sodium chloride 0.9 % 100 mL IVPB       "Followed by" Linked Group Details   100 mg 200 mL/hr over 30 Minutes Intravenous Every 24 hours 06/12/2020 2038 05/31/20 0920   06/04/2020 2100  remdesivir 200 mg in sodium chloride 0.9% 250 mL IVPB       "Followed by" Linked Group Details   200 mg 580 mL/hr over 30 Minutes Intravenous Once 06/21/2020 2038 06/05/2020 2225      Inpatient Medications  Scheduled Meds: . vitamin C  500 mg Oral Daily  . baricitinib  4 mg Oral QHS  . buPROPion  150 mg Oral q AM  . enoxaparin (LOVENOX) injection  40 mg Subcutaneous Q12H  . insulin aspart  0-15 Units Subcutaneous TID WC  . insulin aspart  0-5 Units Subcutaneous QHS  . insulin aspart  5 Units Subcutaneous TID WC  . insulin glargine  10 Units Subcutaneous BID  . Ipratropium-Albuterol  1 puff Inhalation Q6H  . linagliptin  5 mg Oral Daily  . methylPREDNISolone (SOLU-MEDROL) injection  40 mg Intravenous Q12H  . mometasone-formoterol  2 puff Inhalation BID  . montelukast  10 mg Oral QHS  . pantoprazole  40 mg Oral Daily  . pramipexole  1 mg Oral QHS  . senna-docusate  2 tablet Oral BID  . zinc sulfate  220 mg Oral Daily   Continuous Infusions: PRN Meds:.acetaminophen,  albuterol, benzonatate, guaiFENesin-dextromethorphan, ondansetron **OR** ondansetron (ZOFRAN) IV, polyethylene glycol, senna-docusate, sodium chloride   Time Spent in minutes  35   See all Orders from today for further details   Oren Binet M.D on 06/06/2020 at 11:59 AM  To page go to [email or web address] - use universal password  [facility] -  Office  [phone]    Objective:   Vitals:   06/06/20 0103 06/06/20 0347 06/06/20 0728 06/06/20 0753  BP:  (!) 104/56 (!) 100/53   Pulse: 76 81 81   Resp: 18 20 18    Temp:  97.9 F (36.6 C) 98.3 F (36.8 C)   TempSrc:  Axillary Oral   SpO2: 91% (!) 89% (!) 88% (!) 80%    Wt Readings from Last 3 Encounters:  05/24/20 89.3 kg  12/01/19 91.6 kg  11/07/18 88 kg     Intake/Output Summary (Last 24 hours) at 06/06/2020 1159 Last data filed at 06/06/2020 0900 Gross per 24 hour  Intake 240 ml  Output --  Net 240 ml     Physical Exam Gen Exam:Alert awake-not in any distress HEENT:atraumatic, normocephalic Chest: Bibasilar rales CVS:S1S2 regular Abdomen:soft non tender, non distended Extremities:no edema Neurology: Non focal Skin: no rash   Data Review:    CBC Recent Labs  Lab 06/03/20 0500 06/04/20 0515 06/05/20 0526 06/06/20 0750  WBC 13.7* 14.1* 12.5* 14.3*  HGB 14.3 14.2 14.4 14.2  HCT 44.8 43.7 43.7 43.3  PLT 261 341 346 394  MCV 90.1 89.0 90.1 90.4  MCH 28.8 28.9 29.7 29.6  MCHC 31.9 32.5 33.0 32.8  RDW 12.4 12.3 12.3 12.5    Chemistries  Recent Labs  Lab 06/02/20 0240 06/02/20 0240 06/03/20 0500 06/03/20 0755 06/04/20 0515 06/05/20 0526 06/06/20 0750  NA 136  --  137  --  139 136 137  K 4.7   < > 5.8* 4.8 4.3 4.8 4.4  CL 101  --  104  --  101 98 100  CO2 27  --  24  --  30 28 28   GLUCOSE 227*  --  116*  --  76 165* 200*  BUN 23*  --  23*  --  19 19 25*  CREATININE 0.85  --  0.77  --  0.90 0.86 0.99  CALCIUM 8.5*  --  8.6*  --  8.5* 8.8* 8.8*  AST 23  --  34  --  27 21 21   ALT 29  --   22  --  30 25 23   ALKPHOS 79  --  77  --  76 79 75  BILITOT 1.0  --  1.6*  --  1.0 1.1 0.6   < > = values in this interval not displayed.   ------------------------------------------------------------------------------------------------------------------ No results for input(s): CHOL, HDL, LDLCALC, TRIG, CHOLHDL, LDLDIRECT in the last 72 hours.  Lab Results  Component Value Date   HGBA1C 6.9 (H) 05/28/2020   ------------------------------------------------------------------------------------------------------------------ No results for input(s): TSH, T4TOTAL, T3FREE, THYROIDAB in the last 72 hours.  Invalid input(s): FREET3 ------------------------------------------------------------------------------------------------------------------ Recent Labs    06/05/20 0526 06/06/20 0750  FERRITIN  768* 685*    Coagulation profile No results for input(s): INR, PROTIME in the last 168 hours.  Recent Labs    06/05/20 0526 06/06/20 0750  DDIMER 1.85* 1.54*    Cardiac Enzymes No results for input(s): CKMB, TROPONINI, MYOGLOBIN in the last 168 hours.  Invalid input(s): CK ------------------------------------------------------------------------------------------------------------------    Component Value Date/Time   BNP 37.8 06/04/2020 0515    Micro Results Recent Results (from the past 240 hour(s))  Blood Culture (routine x 2)     Status: None   Collection Time: 06/12/2020  8:01 PM   Specimen: BLOOD  Result Value Ref Range Status   Specimen Description BLOOD LEFT ANTECUBITAL  Final   Special Requests   Final    BOTTLES DRAWN AEROBIC AND ANAEROBIC Blood Culture adequate volume   Culture   Final    NO GROWTH 5 DAYS Performed at [facility], [address] [place], [place] [address]    Report Status 06/01/2020 FINAL  Final  SARS Coronavirus 2 by RT PCR (hospital order, performed in [facility] hospital lab) Nasopharyngeal Nasopharyngeal Swab     Status: Abnormal    Collection Time: 06/10/2020  8:40 PM   Specimen: Nasopharyngeal Swab  Result Value Ref Range Status   SARS Coronavirus 2 POSITIVE (A) NEGATIVE Final    Comment: RESULT CALLED TO, READ BACK BY AND VERIFIED WITH: RN J SAWATZKI @2301  06/04/2020 BY S GEZAHEGN (NOTE) SARS-CoV-2 target nucleic acids are DETECTED  SARS-CoV-2 RNA is generally detectable in upper respiratory specimens  during the acute phase of infection.  Positive results are indicative  of the presence of the identified virus, but do not rule out bacterial infection or co-infection with other pathogens not detected by the test.  Clinical correlation with patient history and  other diagnostic information is necessary to determine patient infection status.  The expected result is negative.  Fact Sheet for Patients:   StrictlyIdeas.no   Fact Sheet for Healthcare Providers:   BankingDealers.co.za    This test is not yet approved or cleared by the [place] FDA and  has been authorized for detection and/or diagnosis of SARS-CoV-2 by FDA under an Emergency Use Authorization (EUA).  This EUA will remain in effect (meaning t his test can be used) for the duration of  the COVID-19 declaration under Section 564(b)(1) of the Act, 21 U.S.C. section 360-bbb-3(b)(1), unless the authorization is terminated or revoked sooner.  Performed at [facility], [place] [address] [place], [place] [address]   Blood Culture (routine x 2)     Status: None   Collection Time: 06/04/2020  8:58 PM   Specimen: BLOOD  Result Value Ref Range Status   Specimen Description BLOOD RIGHT ANTECUBITAL  Final   Special Requests   Final    BOTTLES DRAWN AEROBIC AND ANAEROBIC Blood Culture adequate volume   Culture   Final    NO GROWTH 5 DAYS Performed at [facility], [place] [address] [place], [place] [address]    Report Status 06/01/2020 FINAL  Final  MRSA PCR Screening     Status: None   Collection  Time: 05/29/20  6:35 PM   Specimen: Nasal Mucosa; Nasopharyngeal  Result Value Ref Range Status   MRSA by PCR NEGATIVE NEGATIVE Final    Comment:        The GeneXpert MRSA Assay (FDA approved for NASAL specimens only), is one component of a comprehensive MRSA colonization surveillance program. It is not intended to diagnose  MRSA infection nor to guide or monitor treatment for MRSA infections. Performed at [facility], [place] [address] [place], [place] [address]     Radiology Reports DG Chest [place] 1 View  Result Date: 06/05/2020 CLINICAL DATA:  COVID, hypoxia, shortness of breath EXAM: PORTABLE CHEST 1 VIEW COMPARISON:  06/03/2020 FINDINGS: Improving aeration bilaterally. Mild residual interstitial prominence and airspace disease bilaterally, left greater than right. Low lung volumes. Heart is borderline in size. No effusions or pneumothorax. IMPRESSION: Improving aeration with continued mild interstitial and alveolar opacities, left greater than right. Electronically Signed   By: Rolm Baptise M.D.   On: 06/05/2020 08:43   DG Chest Port 1 View  Result Date: 06/03/2020 CLINICAL DATA:  Follow-up COVID-19 pneumonia. EXAM: PORTABLE CHEST 1 VIEW COMPARISON:  06/17/2020 and earlier. FINDINGS: Cardiac silhouette upper normal in size for AP portable technique. Patchy ground-glass opacities throughout the LEFT lung and in the RIGHT mid lung and RIGHT lung base are unchanged since the examination 1 week ago. No new pulmonary parenchymal abnormalities. No visible pleural effusions. IMPRESSION: Stable pneumonia throughout the LEFT lung and in the RIGHT mid lung and RIGHT lung base since the examination 1 week ago. No new abnormalities. Electronically Signed   By: Evangeline Dakin M.D.   On: 06/03/2020 09:28   DG Chest Port 1 View  Result Date: 06/02/2020 CLINICAL DATA:  Cough, COVID, hypoxia EXAM: PORTABLE CHEST 1 VIEW COMPARISON:  11/07/2018 FINDINGS: Lung volumes are small. Bibasilar  asymmetric moderate pulmonary infiltrates are present in keeping with atypical infection and compatible with the given history of COVID-19 pneumonia. No pneumothorax or pleural effusion. Cardiac size within normal limits. Pulmonary vascularity is normal. No acute bone abnormality. IMPRESSION: Bibasilar asymmetric moderate pulmonary infiltrates compatible with the given history of COVID-19 pneumonia. Electronically Signed   By: Fidela Salisbury MD   On: 06/01/2020 20:23

## 2020-06-06 NOTE — Progress Notes (Signed)
Lower extremity venous bilateral study completed.   Please see CV Proc for preliminary results.   Nira Visscher  

## 2020-06-06 NOTE — Progress Notes (Signed)
OT Cancellation Note  Patient Details Name: Claudia Vega MRN: [id number] DOB: 04-Dec-1960   Cancelled Treatment:    Reason Eval/Treat Not Completed: Patient at procedure or test/ unavailable (Pt OTF for test. Will continue to follow as available and appropriate)  Zenovia Jarred, MSOT, OTR/L [facility] Office Number: [phone] Pager: [phone]  Zenovia Jarred 06/06/2020, 3:51 PM

## 2020-06-06 NOTE — Plan of Care (Signed)
  Problem: Education: Goal: Ability to state activities that reduce stress will improve Outcome: Progressing   Problem: Coping: Goal: Ability to identify and develop effective coping behavior will improve Outcome: Progressing   Problem: Self-Concept: Goal: Ability to identify factors that promote anxiety will improve Outcome: Progressing Goal: Level of anxiety will decrease Outcome: Progressing Goal: Ability to modify response to factors that promote anxiety will improve Outcome: Progressing   Problem: Education: Goal: Knowledge of General Education information will improve Description: Including pain rating scale, medication(s)/side effects and non-pharmacologic comfort measures Outcome: Progressing   Problem: Health Behavior/Discharge Planning: Goal: Ability to manage health-related needs will improve Outcome: Progressing   Problem: Clinical Measurements: Goal: Ability to maintain clinical measurements within normal limits will improve Outcome: Progressing Goal: Will remain free from infection Outcome: Progressing Goal: Diagnostic test results will improve Outcome: Progressing Goal: Cardiovascular complication will be avoided Outcome: Progressing   Problem: Activity: Goal: Risk for activity intolerance will decrease Outcome: Progressing   Problem: Nutrition: Goal: Adequate nutrition will be maintained Outcome: Progressing   Problem: Coping: Goal: Level of anxiety will decrease Outcome: Progressing   Problem: Elimination: Goal: Will not experience complications related to bowel motility Outcome: Progressing Goal: Will not experience complications related to urinary retention Outcome: Progressing   Problem: Pain Managment: Goal: General experience of comfort will improve Outcome: Progressing   Problem: Safety: Goal: Ability to remain free from injury will improve Outcome: Progressing   Problem: Skin Integrity: Goal: Risk for impaired skin integrity will  decrease Outcome: Progressing   Problem: Clinical Measurements: Goal: Respiratory complications will improve Outcome: Not Progressing

## 2020-06-07 LAB — COMPREHENSIVE METABOLIC PANEL
ALT: 22 U/L (ref 0–44)
AST: 23 U/L (ref 15–41)
Albumin: 2.4 g/dL — ABNORMAL LOW (ref 3.5–5.0)
Alkaline Phosphatase: 74 U/L (ref 38–126)
Anion gap: 10 (ref 5–15)
BUN: 18 mg/dL (ref 6–20)
CO2: 26 mmol/L (ref 22–32)
Calcium: 8.8 mg/dL — ABNORMAL LOW (ref 8.9–10.3)
Chloride: 102 mmol/L (ref 98–111)
Creatinine, Ser: 0.78 mg/dL (ref 0.44–1.00)
GFR calc Af Amer: >60 mL/min (ref >60)
GFR calc non Af Amer: >60 mL/min (ref >60)
Glucose, Bld: 116 mg/dL — ABNORMAL HIGH (ref 70–99)
Potassium: 4.3 mmol/L (ref 3.5–5.1)
Sodium: 138 mmol/L (ref 135–145)
Total Bilirubin: 0.8 mg/dL (ref 0.3–1.2)
Total Protein: 6.1 g/dL — ABNORMAL LOW (ref 6.5–8.1)

## 2020-06-07 LAB — CBC
HCT: 40.9 % (ref 36.0–46.0)
Hemoglobin: 13.4 g/dL (ref 12.0–15.0)
MCH: 29.1 pg (ref 26.0–34.0)
MCHC: 32.8 g/dL (ref 30.0–36.0)
MCV: 88.7 fL (ref 80.0–100.0)
Platelets: 405 10*3/uL — ABNORMAL HIGH (ref 150–400)
RBC: 4.61 MIL/uL (ref 3.87–5.11)
RDW: 12.8 % (ref 11.5–15.5)
WBC: 15.5 10*3/uL — ABNORMAL HIGH (ref 4.0–10.5)
nRBC: 0 % (ref 0.0–0.2)

## 2020-06-07 LAB — GLUCOSE, CAPILLARY
Glucose-Capillary: 103 mg/dL — ABNORMAL HIGH (ref 70–99)
Glucose-Capillary: 148 mg/dL — ABNORMAL HIGH (ref 70–99)
Glucose-Capillary: 236 mg/dL — ABNORMAL HIGH (ref 70–99)
Glucose-Capillary: 138 mg/dL — ABNORMAL HIGH (ref 70–99)

## 2020-06-07 LAB — C-REACTIVE PROTEIN: CRP: 3.8 mg/dL — ABNORMAL HIGH (ref <1.0)

## 2020-06-07 LAB — FERRITIN: Ferritin: 718 ng/mL — ABNORMAL HIGH (ref 11–307)

## 2020-06-07 LAB — PROCALCITONIN: Procalcitonin: <0.1 ng/mL

## 2020-06-07 LAB — D-DIMER, QUANTITATIVE: D-Dimer, Quant: 1.82 ug/mL-FEU — ABNORMAL HIGH (ref 0.00–0.50)

## 2020-06-07 MED ORDER — FUROSEMIDE 10 MG/ML IJ SOLN
20.0000 mg | Freq: Once | INTRAMUSCULAR | Status: AC
  Administered 2020-06-07: 20 mg via INTRAVENOUS
  Filled 2020-06-07: qty 2

## 2020-06-07 MED ORDER — INSULIN GLARGINE 100 UNIT/ML ~~LOC~~ SOLN
8.0000 [IU] | Freq: Two times a day (BID) | SUBCUTANEOUS | Status: DC
  Administered 2020-06-07 – 2020-06-17 (×19): 8 [IU] via SUBCUTANEOUS
  Filled 2020-06-07 (×23): qty 0.08

## 2020-06-07 NOTE — Progress Notes (Signed)
Occupational Therapy Treatment Patient Details Name: Claudia Vega MRN: [id number] DOB: 1960-10-13 Today's Date: 06/07/2020    History of present illness Pt is a 59 y.o. female presenting 05/30/2020 with shortness of breath. Tested COVID +. Workup for acute hypoxic respiratory failure secondary to COVID-19 viral PNA. CT angio 9/15 negative for PE, ultrasound negative for DVTs. PMH including HTN, asthma.   OT comments  Pt continues to present with poor activity tolerance. Pt performing stand pivot to [facility] and then recliner with Min Guard A. During toileting, pt requiring several seated rest breaks. Cues for purse lip breathing to elevate SpO2 91-87% on 40L HHFNC + 15L NRB and decrease RR to 28. Max A for brushing and managing hair; taking off NRB and pt focusing on purse lip breathing. Continue to recommend dc to home with HHOT and will continue to follow acutely as admitted.   SpO2 84% on 40L with 15L nrb and RR 38-41. With cues for purse lip breathing, RR 28 and SpO2 91-87%. HR 91-112   Follow Up Recommendations  Home health OT (May need post acute rehab)    Equipment Recommendations  3 in 1 bedside commode    Recommendations for Other Services PT consult    Precautions / Restrictions Precautions Precautions: Other (comment) Precaution Comments: 40L HHFNC at 100% FiO2 + 15L NRB Restrictions Weight Bearing Restrictions: No       Mobility Bed Mobility Overal bed mobility: Needs Assistance Bed Mobility: Supine to Sit     Supine to sit: Supervision;HOB elevated     General bed mobility comments: Supervision for safety  Transfers Overall transfer level: Needs assistance Equipment used: None Transfers: Sit to/from [employer] Transfers Sit to Stand: Min guard Stand pivot transfers: Min guard       General transfer comment: No assist required, Min Guard A for safety with lines; pt demonstrates good line management overall    Balance Overall balance assessment: No  apparent balance deficits (not formally assessed)                                         ADL either performed or assessed with clinical judgement   ADL Overall ADL's : Needs assistance/impaired     Grooming: Brushing hair;Maximal assistance;Sitting Grooming Details (indicate cue type and reason): Pt focusing on purse lip breathing while NRB off. RR lowering to 28 and SpO2 91-87%                 Toilet Transfer: Min Statistician Details (indicate cue type and reason): Min Guard A for safety. Fatigues quickly and requiring rest breaks between each movement         Functional mobility during ADLs: Min guard (stand pivot only) General ADL Comments: Pt continues to present with poor activity tolerance. Requiring seated rest breaks. Cues for purse lip breathing     Vision       Perception     Praxis      Cognition Arousal/Alertness: Awake/alert Behavior During Therapy: WFL for tasks assessed/performed;Flat affect Overall Cognitive Status: Within Functional Limits for tasks assessed                                 General Comments: Becoming tearful, stating, "I'm sad about what the doctor said about my lungs... I'm sorry, I'm having a pity  party for myself." Responded well to supportive listening and encouragement        Exercises     Shoulder Instructions       General Comments SpO2 84% on 40L with 15L nrb and RR 38-41. With cues for purse lip breathing, RR 28 and SpO2 91-87%. HR 91-112    Pertinent Vitals/ Pain       Pain Assessment: No/denies pain  Home Living                                          Prior Functioning/Environment              Frequency  Min 2X/week        Progress Toward Goals  OT Goals(current goals can now be found in the care plan section)  Progress towards OT goals: Progressing toward goals  Acute Rehab OT Goals Patient Stated Goal: "I don't want  to die" OT Goal Formulation: With patient Time For Goal Achievement: 06/14/20 Potential to Achieve Goals: Good ADL Goals Pt Will Perform Grooming: with modified independence;standing;sitting Pt Will Perform Lower Body Dressing: with modified independence;sit to/from stand Pt Will Transfer to Toilet: with modified independence;ambulating;bedside commode Pt Will Perform Toileting - Clothing Manipulation and hygiene: with modified independence;sitting/lateral leans;sit to/from stand Additional ADL Goal #1: Pt will independently verbalize three energy conservation techniques for ADLs and IADLs Additional ADL Goal #2: Pt will independently monitor SpO2 and use purse lip breathing for ADLs  Plan Discharge plan remains appropriate    Co-evaluation                 AM-PAC OT "6 Clicks" Daily Activity     Outcome Measure   Help from another person eating meals?: A Little Help from another person taking care of personal grooming?: A Little Help from another person toileting, which includes using toliet, bedpan, or urinal?: A Lot Help from another person bathing (including washing, rinsing, drying)?: A Lot Help from another person to put on and taking off regular upper body clothing?: A Lot Help from another person to put on and taking off regular lower body clothing?: A Lot 6 Click Score: 14    End of Session Equipment Utilized During Treatment: Oxygen  OT Visit Diagnosis: Unsteadiness on feet (R26.81);Other abnormalities of gait and mobility (R26.89);Muscle weakness (generalized) (M62.81)   Activity Tolerance Patient limited by fatigue   Patient Left in chair;with call bell/phone within reach   Nurse Communication Mobility status        Time: 7824-2353 OT Time Calculation (min): 24 min  Charges: OT General Charges $OT Visit: 1 Visit OT Treatments $Self Care/Home Management : 23-37 mins  Ruth, OTR/L Acute Rehab Pager: [phone] Office:  [place] 06/07/2020, 2:21 PM

## 2020-06-07 NOTE — Progress Notes (Signed)
PROGRESS NOTE                                                                                                                                                                                                             Patient Demographics:    Claudia Vega, is a 59 y.o. female, DOB - Nov 04, 1960, BOF:[id number]  Outpatient Primary MD for the patient is Greig Right, MD   Admit date - 06/07/2020   LOS - 82  Chief Complaint  Patient presents with  . Shortness of Breath  . Cough       Brief Narrative: Patient is a 59 y.o. female with PMHx of HTN, asthma-who tested positive for COVID-19 on 9/5-admitted for worsening shortness of breath due to acute hypoxic respiratory failure requiring HFNC secondary to COVID-19 pneumonia  COVID-19 vaccinated status: Unvaccinated  Significant Events: 9/5>> Admit to [facility] for hypoxia due to COVID-19  Significant studies: 9/5>>Chest x-ray: Patchy bilateral infiltrates 9/12>>> chest x-ray: Stable pneumonia throughout left lung and right midlung 9/14>> chest x-ray: Improving aeration with continued mild interstitial and alveolar opacities-left greater than right 9/15>> CTA chest: No PE, mosaic attenuation/groundglass opacities with more consolidative bibasilar findings as well as tubular bronchiectasis.  Soft tissue densities in the bilateral breasts. 9/15>> bilateral lower extremity Doppler: No DVT.  COVID-19 medications: Steroids:9/5>> Remdesivir:9/5>>9/9 Baricitinib:9/5>>  Antibiotics: None  Microbiology data: 9/5>>Blood culture:no growth  Procedures: None  Consults: None  DVT prophylaxis: Prophylactic Lovenox at intermediate-twice daily dosing.   Subjective:   She remains essentially the same-stable at rest but on heated high flow and NRB.   Assessment  & Plan :   Acute Hypoxic Resp Failure due to Covid 19 Viral pneumonia: Continues to have severe hypoxemia-remains  on both heated high flow and NRB-but comfortable.  Has completed a course of remdesivir-remains on steroids and baricitinib.  CTA chest on 9/15 without PE but with significant infiltrates in both lungs.  No obvious evidence of volume overload-but will go and give 1 dose of IV Lasix to try and keep her in negative balance.  Remains tenuous but stable over the past few days-continue to watch closely-if she deteriorates significantly-require transfer to the ICU.    Fever: afebrile O2 requirements:  SpO2: 97 % O2 Flow Rate (L/min): 30 L/min (+15L NRB) FiO2 (%): 100 %   COVID-19 Labs: Recent Labs  06/05/20 0526 06/06/20 0750 06/07/20 0638  DDIMER 1.85* 1.54* 1.82*  FERRITIN 768* 685* 718*  CRP 8.2* 3.4* 3.8*       Component Value Date/Time   BNP 37.8 06/04/2020 0515    Recent Labs  Lab 06/04/20 0515 06/07/20 0731  PROCALCITON <0.10 <0.10    Lab Results  Component Value Date   SARSCOV2NAA POSITIVE (A) 06/12/2020     Prone/Incentive Spirometry: encouraged patient to lie prone for 3-4 hours at a time for a total of 16 hours a day, and to encourage incentive spirometry use 3-4/hour.  DM-2 (A1c 6.9 on 9/6) with steroid-induced hyperglycemia: CBGs stable but mostly in the low 100s range-decrease Lantus to 8 units twice daily, continue Premeal NovoLog and SSI.  Follow and adjust.  May need to further adjust Lantus dosing if CBGs remain in this range.    Recent Labs    06/06/20 2143 06/07/20 0721 06/07/20 1131  GLUCAP 91 103* 148*    HTN: BP soft-no longer on Cardizem.  Resume when able.  Bronchial asthma: No wheezing-stable-continue inhaler regimen  Morbid Obesity: Estimated body mass index is 38.56 kg/m as calculated from the following:   Height as of 05/24/20: 4' 11.9" (1.521 m).   Weight as of 05/24/20: 89.3 kg.    GI prophylaxis: PPI  ABG: No results found for: PHART, PCO2ART, PO2ART, HCO3, TCO2, ACIDBASEDEF, O2SAT  Vent Settings: N/A  Condition - Extremely  Guarded  Family Communication  :  Spouse Ferrebee [phone])  updated over the phone 9/16  Code Status :  Full Code (discussed with spouse and patient at length-patient initially said she does not want to be intubated/ventilator support-but after further discussion-she is not sure-patient/family to discuss-I will reach out to them tomorrow morning-she remains a full code for now)  Diet :  Diet Order            Diet Carb Modified Fluid consistency: Thin; Room service appropriate? Yes  Diet effective now                  Disposition Plan  :   Status is: Inpatient  Remains inpatient appropriate because:Inpatient level of care appropriate due to severity of illness  Dispo: The patient is from: Home              Anticipated d/c is to: Home              Anticipated d/c date is: > 3 days              Patient currently is not medically stable to d/c.   Barriers to discharge: Hypoxia requiring O2 supplementation/complete 5 days of IV Remdesivir  Antimicorbials  :    Anti-infectives (From admission, onward)   Start     Dose/Rate Route Frequency Ordered Stop   05/28/20 1000  remdesivir 100 mg in sodium chloride 0.9 % 100 mL IVPB       "Followed by" Linked Group Details   100 mg 200 mL/hr over 30 Minutes Intravenous Every 24 hours 06/14/2020 2038 05/31/20 0920   06/06/2020 2100  remdesivir 200 mg in sodium chloride 0.9% 250 mL IVPB       "Followed by" Linked Group Details   200 mg 580 mL/hr over 30 Minutes Intravenous Once 06/06/2020 2038 06/08/2020 2225      Inpatient Medications  Scheduled Meds: . vitamin C  500 mg Oral Daily  . baricitinib  4 mg Oral QHS  . buPROPion  150 mg Oral  q AM  . enoxaparin (LOVENOX) injection  40 mg Subcutaneous Q12H  . insulin aspart  0-15 Units Subcutaneous TID WC  . insulin aspart  0-5 Units Subcutaneous QHS  . insulin aspart  5 Units Subcutaneous TID WC  . insulin glargine  10 Units Subcutaneous BID  . Ipratropium-Albuterol  1 puff Inhalation Q6H    . linagliptin  5 mg Oral Daily  . methylPREDNISolone (SOLU-MEDROL) injection  40 mg Intravenous Q12H  . mometasone-formoterol  2 puff Inhalation BID  . montelukast  10 mg Oral QHS  . pantoprazole  40 mg Oral Daily  . pramipexole  1 mg Oral QHS  . senna-docusate  2 tablet Oral BID  . zinc sulfate  220 mg Oral Daily   Continuous Infusions: PRN Meds:.acetaminophen, albuterol, benzonatate, guaiFENesin-dextromethorphan, ondansetron **OR** ondansetron (ZOFRAN) IV, polyethylene glycol, senna-docusate, sodium chloride   Time Spent in minutes  35   See all Orders from today for further details   Oren Binet M.D on 06/07/2020 at 2:42 PM  To page go to [email or web address] - use universal password  [facility] -  Office  [phone]    Objective:   Vitals:   06/07/20 0308 06/07/20 0415 06/07/20 0726 06/07/20 1136  BP:  (!) 111/59 101/64 100/69  Pulse: 89 84 84 89  Resp: 20 18 20 19   Temp:  99.1 F (37.3 C) 98 F (36.7 C) 98 F (36.7 C)  TempSrc:  Axillary Oral Oral  SpO2: 98% (!) 82% 90% 97%    Wt Readings from Last 3 Encounters:  05/24/20 89.3 kg  12/01/19 91.6 kg  11/07/18 88 kg     Intake/Output Summary (Last 24 hours) at 06/07/2020 1442 Last data filed at 06/07/2020 0900 Gross per 24 hour  Intake 120 ml  Output --  Net 120 ml     Physical Exam Gen Exam:Alert awake-not in any distress HEENT:atraumatic, normocephalic Chest: B/L clear to auscultation anteriorly CVS:S1S2 regular Abdomen:soft non tender, non distended Extremities:no edema Neurology: Non focal Skin: no rash   Data Review:    CBC Recent Labs  Lab 06/03/20 0500 06/04/20 0515 06/05/20 0526 06/06/20 0750 06/07/20 0638  WBC 13.7* 14.1* 12.5* 14.3* 15.5*  HGB 14.3 14.2 14.4 14.2 13.4  HCT 44.8 43.7 43.7 43.3 40.9  PLT 261 341 346 394 405*  MCV 90.1 89.0 90.1 90.4 88.7  MCH 28.8 28.9 29.7 29.6 29.1  MCHC 31.9 32.5 33.0 32.8 32.8  RDW 12.4 12.3 12.3 12.5 12.8    Chemistries   Recent Labs  Lab 06/03/20 0500 06/03/20 0500 06/03/20 0755 06/04/20 0515 06/05/20 0526 06/06/20 0750 06/07/20 0638  NA 137  --   --  139 136 137 138  K 5.8*   < > 4.8 4.3 4.8 4.4 4.3  CL 104  --   --  101 98 100 102  CO2 24  --   --  30 28 28 26   GLUCOSE 116*  --   --  76 165* 200* 116*  BUN 23*  --   --  19 19 25* 18  CREATININE 0.77  --   --  0.90 0.86 0.99 0.78  CALCIUM 8.6*  --   --  8.5* 8.8* 8.8* 8.8*  AST 34  --   --  27 21 21 23   ALT 22  --   --  30 25 23 22   ALKPHOS 77  --   --  76 79 75 74  BILITOT 1.6*  --   --  1.0 1.1  0.6 0.8   < > = values in this interval not displayed.   ------------------------------------------------------------------------------------------------------------------ No results for input(s): CHOL, HDL, LDLCALC, TRIG, CHOLHDL, LDLDIRECT in the last 72 hours.  Lab Results  Component Value Date   HGBA1C 6.9 (H) 05/28/2020   ------------------------------------------------------------------------------------------------------------------ No results for input(s): TSH, T4TOTAL, T3FREE, THYROIDAB in the last 72 hours.  Invalid input(s): FREET3 ------------------------------------------------------------------------------------------------------------------ Recent Labs    06/06/20 0750 06/07/20 0638  FERRITIN 685* 718*    Coagulation profile No results for input(s): INR, PROTIME in the last 168 hours.  Recent Labs    06/06/20 0750 06/07/20 0638  DDIMER 1.54* 1.82*    Cardiac Enzymes No results for input(s): CKMB, TROPONINI, MYOGLOBIN in the last 168 hours.  Invalid input(s): CK ------------------------------------------------------------------------------------------------------------------    Component Value Date/Time   BNP 37.8 06/04/2020 0515    Micro Results Recent Results (from the past 240 hour(s))  MRSA PCR Screening     Status: None   Collection Time: 05/29/20  6:35 PM   Specimen: Nasal Mucosa; Nasopharyngeal   Result Value Ref Range Status   MRSA by PCR NEGATIVE NEGATIVE Final    Comment:        The GeneXpert MRSA Assay (FDA approved for NASAL specimens only), is one component of a comprehensive MRSA colonization surveillance program. It is not intended to diagnose MRSA infection nor to guide or monitor treatment for MRSA infections. Performed at [facility], [place] [address] [place], [place] [address]     Radiology Reports CT ANGIO CHEST PE W OR WO CONTRAST  Result Date: 06/06/2020 CLINICAL DATA:  COVID-19, with hypoxia and shortness of breath, positive D dimer, suspected pulmonary embolus. EXAM: CT ANGIOGRAPHY CHEST WITH CONTRAST TECHNIQUE: Multidetector CT imaging of the chest was performed using the standard protocol during bolus administration of intravenous contrast. Multiplanar CT image reconstructions and MIPs were obtained to evaluate the vascular anatomy. CONTRAST:  11mL OMNIPAQUE IOHEXOL 350 MG/ML SOLN COMPARISON:  Chest x-ray 06/05/2020. CT abdomen pelvis 10/21/2019 FINDINGS: Cardiovascular: Satisfactory opacification of the pulmonary arteries to the segmental level. The main pulmonary artery is normal in caliber. No evidence of pulmonary embolism. Normal heart size. No significant pericardial effusion. Small to moderate volume hiatal hernia. Mediastinum/Nodes: Prominent mediastinal lymph nodes. No enlarged mediastinal, hilar, or axillary lymph nodes. Thyroid gland, trachea, and esophagus demonstrate no significant findings. Lungs/Pleura: Slightly expiratory phase of respiration. Diffuse ground-glass airspace opacity with more consolidative opacity in bilateral lower lobes. Mosaic attenuation of the lung best appreciated in the right upper lung zone. Superimposed mild septal wall thickening as well as bilateral lower lobe mild tubular bronchiectasis. Upper Abdomen: No acute abnormality. Musculoskeletal: Nonspecific 7 mm soft tissue density within the right breast likely representing  an intramammary lymph node (6:46). Nonspecific 1 cm soft tissue density within the left breast (6:36) in a patient with little glandular tissue. No acute or significant osseous findings. Review of the MIP images confirms the above findings. IMPRESSION: 1. No pulmonary embolus. 2. Mosaic attenuation and ground-glass airspace opacity with more consolidative bibasilar findings as well as tubular bronchiectasis is consistent with known COVID pneumonia with superimposed ARDS not excluded. In the setting of protracted COVID pneumonia, presence of infectious or inflammatory fibrosis is possible. Consider long-term CT follow-up. 3. Other imaging findings of potential clinical significance: Couple of soft tissue densities within bilateral breast in a patient with minimal glandular tissue; correlate with prior mammography or if clinically indicated follow-up with non-emergent mammographic evaluation. Small to moderate volume hiatal hernia. Electronically  Signed   By: Iven Finn M.D.   On: 06/06/2020 16:54   DG Chest Port 1 View  Result Date: 06/05/2020 CLINICAL DATA:  COVID, hypoxia, shortness of breath EXAM: PORTABLE CHEST 1 VIEW COMPARISON:  06/03/2020 FINDINGS: Improving aeration bilaterally. Mild residual interstitial prominence and airspace disease bilaterally, left greater than right. Low lung volumes. Heart is borderline in size. No effusions or pneumothorax. IMPRESSION: Improving aeration with continued mild interstitial and alveolar opacities, left greater than right. Electronically Signed   By: Rolm Baptise M.D.   On: 06/05/2020 08:43   DG Chest Port 1 View  Result Date: 06/03/2020 CLINICAL DATA:  Follow-up COVID-19 pneumonia. EXAM: PORTABLE CHEST 1 VIEW COMPARISON:  06/20/2020 and earlier. FINDINGS: Cardiac silhouette upper normal in size for AP portable technique. Patchy ground-glass opacities throughout the LEFT lung and in the RIGHT mid lung and RIGHT lung base are unchanged since the examination 1  week ago. No new pulmonary parenchymal abnormalities. No visible pleural effusions. IMPRESSION: Stable pneumonia throughout the LEFT lung and in the RIGHT mid lung and RIGHT lung base since the examination 1 week ago. No new abnormalities. Electronically Signed   By: Evangeline Dakin M.D.   On: 06/03/2020 09:28   DG Chest Port 1 View  Result Date: 06/01/2020 CLINICAL DATA:  Cough, COVID, hypoxia EXAM: PORTABLE CHEST 1 VIEW COMPARISON:  11/07/2018 FINDINGS: Lung volumes are small. Bibasilar asymmetric moderate pulmonary infiltrates are present in keeping with atypical infection and compatible with the given history of COVID-19 pneumonia. No pneumothorax or pleural effusion. Cardiac size within normal limits. Pulmonary vascularity is normal. No acute bone abnormality. IMPRESSION: Bibasilar asymmetric moderate pulmonary infiltrates compatible with the given history of COVID-19 pneumonia. Electronically Signed   By: Fidela Salisbury MD   On: 06/15/2020 20:23   VAS [place] LOWER EXTREMITY VENOUS (DVT)  Result Date: 06/06/2020  Lower Venous DVTStudy Indications: D-dimer.  Limitations: Patient intolerant of compressions mid-dist thigh. Comparison Study: No prior studies. Performing Technologist: Darlin Coco  Examination Guidelines: A complete evaluation includes B-mode imaging, spectral Doppler, color Doppler, and power Doppler as needed of all accessible portions of each vessel. Bilateral testing is considered an integral part of a complete examination. Limited examinations for reoccurring indications may be performed as noted. The reflux portion of the exam is performed with the patient in reverse Trendelenburg.  +---------+---------------+---------+-----------+----------+--------------+ RIGHT    CompressibilityPhasicitySpontaneityPropertiesThrombus Aging +---------+---------------+---------+-----------+----------+--------------+ CFV      Full           Yes      Yes                                  +---------+---------------+---------+-----------+----------+--------------+ SFJ      Full                                                        +---------+---------------+---------+-----------+----------+--------------+ FV Prox  Full                                                        +---------+---------------+---------+-----------+----------+--------------+ FV Mid   Full                                                        +---------+---------------+---------+-----------+----------+--------------+  FV DistalFull                                                        +---------+---------------+---------+-----------+----------+--------------+ PFV      Full                                                        +---------+---------------+---------+-----------+----------+--------------+ POP      Full           Yes      Yes                                 +---------+---------------+---------+-----------+----------+--------------+ PTV      Full                                                        +---------+---------------+---------+-----------+----------+--------------+ PERO     Full                                                        +---------+---------------+---------+-----------+----------+--------------+   +---------+---------------+---------+-----------+----------+--------------+ LEFT     CompressibilityPhasicitySpontaneityPropertiesThrombus Aging +---------+---------------+---------+-----------+----------+--------------+ CFV      Full           Yes      Yes                                 +---------+---------------+---------+-----------+----------+--------------+ SFJ      Full                                                        +---------+---------------+---------+-----------+----------+--------------+ FV Prox  Full                                                         +---------+---------------+---------+-----------+----------+--------------+ FV Mid   Full                                                        +---------+---------------+---------+-----------+----------+--------------+ FV Distal               Yes      Yes                                 +---------+---------------+---------+-----------+----------+--------------+  PFV      Full                                                        +---------+---------------+---------+-----------+----------+--------------+ POP      Full           Yes      Yes                                 +---------+---------------+---------+-----------+----------+--------------+ PTV      Full                                                        +---------+---------------+---------+-----------+----------+--------------+ PERO     Full                                                        +---------+---------------+---------+-----------+----------+--------------+     Summary: RIGHT: - There is no evidence of deep vein thrombosis in the lower extremity.  - No cystic structure found in the popliteal fossa.  LEFT: - There is no evidence of deep vein thrombosis in the lower extremity.  - No cystic structure found in the popliteal fossa.  *See table(s) above for measurements and observations. Electronically signed by Harold Barban MD on 06/06/2020 at 5:20:35 PM.    Final

## 2020-06-07 NOTE — Plan of Care (Signed)
  Problem: Education: Goal: Ability to state activities that reduce stress will improve Outcome: Progressing   Problem: Coping: Goal: Ability to identify and develop effective coping behavior will improve Outcome: Progressing   Problem: Self-Concept: Goal: Ability to identify factors that promote anxiety will improve Outcome: Progressing Goal: Level of anxiety will decrease Outcome: Progressing Goal: Ability to modify response to factors that promote anxiety will improve Outcome: Progressing   Problem: Education: Goal: Knowledge of General Education information will improve Description: Including pain rating scale, medication(s)/side effects and non-pharmacologic comfort measures Outcome: Progressing   Problem: Health Behavior/Discharge Planning: Goal: Ability to manage health-related needs will improve Outcome: Progressing   Problem: Clinical Measurements: Goal: Ability to maintain clinical measurements within normal limits will improve Outcome: Progressing Goal: Will remain free from infection Outcome: Progressing Goal: Diagnostic test results will improve Outcome: Progressing Goal: Cardiovascular complication will be avoided Outcome: Progressing   Problem: Activity: Goal: Risk for activity intolerance will decrease Outcome: Progressing   Problem: Nutrition: Goal: Adequate nutrition will be maintained Outcome: Progressing   Problem: Coping: Goal: Level of anxiety will decrease Outcome: Progressing   Problem: Elimination: Goal: Will not experience complications related to bowel motility Outcome: Progressing Goal: Will not experience complications related to urinary retention Outcome: Progressing   Problem: Pain Managment: Goal: General experience of comfort will improve Outcome: Progressing   Problem: Safety: Goal: Ability to remain free from injury will improve Outcome: Progressing   Problem: Skin Integrity: Goal: Risk for impaired skin integrity will  decrease Outcome: Progressing   Problem: Clinical Measurements: Goal: Respiratory complications will improve Outcome: Not Progressing

## 2020-06-07 NOTE — Progress Notes (Signed)
Physical Therapy Treatment Patient Details Name: Claudia Vega MRN: [id number] DOB: December 11, 1960 Today's Date: 06/07/2020    History of Present Illness Pt is a 59 y.o. female presenting 06/05/2020 with shortness of breath. Tested COVID +. Workup for acute hypoxic respiratory failure secondary to COVID-19 viral PNA. CT angio 9/15 negative for PE, ultrasound negative for DVTs. PMH including HTN, asthma.   PT Comments    Pt slowly progressing with mobility; pt became tearful and reports, "I'm feeling sad about what the doctor told me about my lungs." Today's session focused on standing activity; pt able to perform a few short bouts of activity, requiring prolonged seated rest breaks to recover SOB. Encouraged more frequent standing/seated therex and incentive spirometer use.  SpO2 88-98% on 25L O2 HHFNC + 15L NRB; HR 90    Follow Up Recommendations  Home health PT     Equipment Recommendations   (TBD)    Recommendations for Other Services       Precautions / Restrictions Precautions Precautions: Other (comment) Precaution Comments: 30L HHFNC at 100% FiO2 + 15L NRB Restrictions Weight Bearing Restrictions: No    Mobility  Bed Mobility               General bed mobility comments: In recliner upon arrival  Transfers Overall transfer level: Needs assistance Equipment used: None Transfers: Sit to/from Stand Sit to Stand: Supervision         General transfer comment: No assist required, supervision for safety with lines; pt demonstrates good line management overall  Ambulation/Gait Ambulation/Gait assistance: Supervision Gait Distance (Feet): 26 Feet Assistive device: None Gait Pattern/deviations: Step-through pattern;Decreased stride length Gait velocity: Decreased   General Gait Details: Ambulated 10' + 16' with prolonged seated rest; moving within confines of HHFNC lines, pt with fair stability at supervision-level; good awareness of lines   Stairs              Wheelchair Mobility    Modified Rankin (Stroke Patients Only)       Balance Overall balance assessment: No apparent balance deficits (not formally assessed)                                          Cognition Arousal/Alertness: Awake/alert Behavior During Therapy: WFL for tasks assessed/performed;Flat affect Overall Cognitive Status: Within Functional Limits for tasks assessed                                 General Comments: Becoming tearful, stating, "I'm sad about what the doctor said about my lungs... I'm sorry, I'm having a pity party for myself." Responded well to supportive listening and encouragement      Exercises      General Comments        Pertinent Vitals/Pain Pain Assessment: No/denies pain    Home Living                      Prior Function            PT Goals (current goals can now be found in the care plan section) Acute Rehab PT Goals Patient Stated Goal: "I don't want to die" PT Goal Formulation: With patient Time For Goal Achievement: 06/21/20 Potential to Achieve Goals: Fair Progress towards PT goals: Progressing toward goals (slowly)    Frequency  Min 3X/week      PT Plan Current plan remains appropriate    Co-evaluation              AM-PAC PT "6 Clicks" Mobility   Outcome Measure  Help needed turning from your back to your side while in a flat bed without using bedrails?: None Help needed moving from lying on your back to sitting on the side of a flat bed without using bedrails?: None Help needed moving to and from a bed to a chair (including a wheelchair)?: None Help needed standing up from a chair using your arms (e.g., wheelchair or bedside chair)?: None Help needed to walk in hospital room?: A Little Help needed climbing 3-5 steps with a railing? : A Little 6 Click Score: 22    End of Session Equipment Utilized During Treatment: Oxygen Activity Tolerance: Patient  tolerated treatment well;Patient limited by fatigue Patient left: in chair;with call bell/phone within reach Nurse Communication: Mobility status PT Visit Diagnosis: Other abnormalities of gait and mobility (R26.89)     Time: 6160-7371 PT Time Calculation (min) (ACUTE ONLY): 24 min  Charges:  $Therapeutic Exercise: 8-22 mins $Therapeutic Activity: 8-22 mins                     Mabeline Caras, PT, DPT Acute Rehabilitation Services  Pager [phone] Office [place] 06/07/2020, 1:46 PM

## 2020-06-08 LAB — COMPREHENSIVE METABOLIC PANEL
ALT: 20 U/L (ref 0–44)
AST: 23 U/L (ref 15–41)
Albumin: 2.3 g/dL — ABNORMAL LOW (ref 3.5–5.0)
Alkaline Phosphatase: 74 U/L (ref 38–126)
Anion gap: 10 (ref 5–15)
BUN: 22 mg/dL — ABNORMAL HIGH (ref 6–20)
CO2: 27 mmol/L (ref 22–32)
Calcium: 8.7 mg/dL — ABNORMAL LOW (ref 8.9–10.3)
Chloride: 100 mmol/L (ref 98–111)
Creatinine, Ser: 0.86 mg/dL (ref 0.44–1.00)
GFR calc Af Amer: >60 mL/min (ref >60)
GFR calc non Af Amer: >60 mL/min (ref >60)
Glucose, Bld: 157 mg/dL — ABNORMAL HIGH (ref 70–99)
Potassium: 4.5 mmol/L (ref 3.5–5.1)
Sodium: 137 mmol/L (ref 135–145)
Total Bilirubin: 0.8 mg/dL (ref 0.3–1.2)
Total Protein: 6 g/dL — ABNORMAL LOW (ref 6.5–8.1)

## 2020-06-08 LAB — GLUCOSE, CAPILLARY
Glucose-Capillary: 109 mg/dL — ABNORMAL HIGH (ref 70–99)
Glucose-Capillary: 180 mg/dL — ABNORMAL HIGH (ref 70–99)
Glucose-Capillary: 178 mg/dL — ABNORMAL HIGH (ref 70–99)
Glucose-Capillary: 123 mg/dL — ABNORMAL HIGH (ref 70–99)

## 2020-06-08 LAB — FERRITIN: Ferritin: 788 ng/mL — ABNORMAL HIGH (ref 11–307)

## 2020-06-08 LAB — C-REACTIVE PROTEIN: CRP: 4.6 mg/dL — ABNORMAL HIGH (ref <1.0)

## 2020-06-08 LAB — D-DIMER, QUANTITATIVE: D-Dimer, Quant: 1.79 ug/mL-FEU — ABNORMAL HIGH (ref 0.00–0.50)

## 2020-06-08 LAB — CBC
HCT: 40.8 % (ref 36.0–46.0)
Hemoglobin: 13.3 g/dL (ref 12.0–15.0)
MCH: 29.1 pg (ref 26.0–34.0)
MCHC: 32.6 g/dL (ref 30.0–36.0)
MCV: 89.3 fL (ref 80.0–100.0)
Platelets: 392 10*3/uL (ref 150–400)
RBC: 4.57 MIL/uL (ref 3.87–5.11)
RDW: 12.6 % (ref 11.5–15.5)
WBC: 14.2 10*3/uL — ABNORMAL HIGH (ref 4.0–10.5)
nRBC: 0 % (ref 0.0–0.2)

## 2020-06-08 MED ORDER — FUROSEMIDE 10 MG/ML IJ SOLN
40.0000 mg | Freq: Once | INTRAMUSCULAR | Status: AC
  Administered 2020-06-08: 40 mg via INTRAVENOUS
  Filled 2020-06-08: qty 4

## 2020-06-08 MED ORDER — SALINE SPRAY 0.65 % NA SOLN
1.0000 | NASAL | Status: DC | PRN
  Filled 2020-06-08: qty 44

## 2020-06-08 MED ORDER — ENOXAPARIN SODIUM 40 MG/0.4ML ~~LOC~~ SOLN
40.0000 mg | SUBCUTANEOUS | Status: DC
  Administered 2020-06-09 – 2020-06-17 (×9): 40 mg via SUBCUTANEOUS
  Filled 2020-06-08 (×9): qty 0.4

## 2020-06-08 MED ORDER — OXYMETAZOLINE HCL 0.05 % NA SOLN
3.0000 | Freq: Three times a day (TID) | NASAL | Status: DC
  Administered 2020-06-08 – 2020-06-14 (×16): 3 via NASAL
  Filled 2020-06-08: qty 30

## 2020-06-08 NOTE — Plan of Care (Signed)
  Problem: Education: Goal: Ability to state activities that reduce stress will improve Outcome: Progressing   Problem: Coping: Goal: Ability to identify and develop effective coping behavior will improve Outcome: Progressing   Problem: Self-Concept: Goal: Ability to identify factors that promote anxiety will improve Outcome: Progressing Goal: Level of anxiety will decrease Outcome: Progressing Goal: Ability to modify response to factors that promote anxiety will improve Outcome: Progressing   Problem: Education: Goal: Knowledge of General Education information will improve Description: Including pain rating scale, medication(s)/side effects and non-pharmacologic comfort measures Outcome: Progressing   Problem: Health Behavior/Discharge Planning: Goal: Ability to manage health-related needs will improve Outcome: Progressing   Problem: Clinical Measurements: Goal: Ability to maintain clinical measurements within normal limits will improve Outcome: Progressing Goal: Will remain free from infection Outcome: Progressing Goal: Diagnostic test results will improve Outcome: Progressing Goal: Respiratory complications will improve Outcome: Progressing Goal: Cardiovascular complication will be avoided Outcome: Progressing   Problem: Activity: Goal: Risk for activity intolerance will decrease Outcome: Progressing   Problem: Nutrition: Goal: Adequate nutrition will be maintained Outcome: Progressing   Problem: Coping: Goal: Level of anxiety will decrease Outcome: Progressing   Problem: Elimination: Goal: Will not experience complications related to bowel motility Outcome: Progressing Goal: Will not experience complications related to urinary retention Outcome: Progressing   Problem: Pain Managment: Goal: General experience of comfort will improve Outcome: Progressing   Problem: Safety: Goal: Ability to remain free from injury will improve Outcome: Progressing    Problem: Skin Integrity: Goal: Risk for impaired skin integrity will decrease Outcome: Progressing   

## 2020-06-08 NOTE — Progress Notes (Signed)
PROGRESS NOTE                                                                                                                                                                                                             Patient Demographics:    Claudia Vega, is a 59 y.o. female, DOB - 12/19/1960, AUQ:[id number]  Outpatient Primary MD for the patient is Greig Right, MD   Admit date - 05/23/2020   LOS - 12  Chief Complaint  Patient presents with  . Shortness of Breath  . Cough       Brief Narrative: Patient is a 59 y.o. female with PMHx of HTN, asthma-who tested positive for COVID-19 on 9/5-admitted for worsening shortness of breath due to acute hypoxic respiratory failure requiring HFNC secondary to COVID-19 pneumonia  COVID-19 vaccinated status: Unvaccinated  Significant Events: 9/5>> Admit to [facility] for hypoxia due to COVID-19  Significant studies: 9/5>>Chest x-ray: Patchy bilateral infiltrates 9/12>>> chest x-ray: Stable pneumonia throughout left lung and right midlung 9/14>> chest x-ray: Improving aeration with continued mild interstitial and alveolar opacities-left greater than right 9/15>> CTA chest: No PE, mosaic attenuation/groundglass opacities with more consolidative bibasilar findings as well as tubular bronchiectasis.  Soft tissue densities in the bilateral breasts. 9/15>> bilateral lower extremity Doppler: No DVT.  COVID-19 medications: Steroids:9/5>> Remdesivir:9/5>>9/9 Baricitinib:9/5>>  Antibiotics: None  Microbiology data: 9/5>>Blood culture:no growth  Procedures: None  Consults: None  DVT prophylaxis: Prophylactic Lovenox at intermediate-twice daily dosing.   Subjective:   Remains essentially the same-still on heated high flow and NRB.  When I saw her she had no other complaints-she had talked with the husband-and wanted to be a DO NOT RESUSCITATE.  Earlier this afternoon-nurse  informed me that patient had very minimal epistaxis.   Assessment  & Plan :   Acute Hypoxic Resp Failure due to Covid 19 Viral pneumonia: Hypoxemia remains unchanged-on both heated high flow and NRB-comfortable.  Remains on steroids and baricitinib.  CTA chest on 9/15 without PE but with significant bilateral lung infiltrates.  No obvious evidence of volume overload-but will go ahead and give another dose of IV Lasix today to try and maintain negative balance.  She continues to remain tenuous-plans are to continue to monitor closely.  Fever: afebrile O2 requirements:  SpO2: 98 % O2 Flow Rate (L/min): 25 L/min FiO2 (%): 100 %  COVID-19 Labs: Recent Labs    06/06/20 0750 06/07/20 0638 06/08/20 0500  DDIMER 1.54* 1.82* 1.79*  FERRITIN 685* 718* 788*  CRP 3.4* 3.8* 4.6*       Component Value Date/Time   BNP 37.8 06/04/2020 0515    Recent Labs  Lab 06/04/20 0515 06/07/20 0731  PROCALCITON <0.10 <0.10    Lab Results  Component Value Date   SARSCOV2NAA POSITIVE (A) 06/13/2020     Prone/Incentive Spirometry: encouraged patient to lie prone for 3-4 hours at a time for a total of 16 hours a day, and to encourage incentive spirometry use 3-4/hour.  Epistaxis: Appears to be very minimal at this point-change in prophylactic Lovenox to daily dosing-have advised patient not to blow/pick nose.  Starting Afrin-watch closely.  If has significant nosebleeds-we will consult ENT.  DM-2 (A1c 6.9 on 9/6) with steroid-induced hyperglycemia: CBGs stable-continue Lantus 8 units twice daily, but mostly in the low 100s range-decrease Lantus to 8 units twice daily, 5 units of NovoLog with meals and SSI.  Continue to monitor closely and adjust accordingly.    Recent Labs    06/07/20 2109 06/08/20 0735 06/08/20 1200  GLUCAP 138* 109* 180*    HTN: BP soft-no longer on Cardizem.  Resume when able.  Bronchial asthma: No wheezing-stable-continue inhaler regimen  Morbid Obesity: Estimated  body mass index is 38.41 kg/m as calculated from the following:   Height as of 05/24/20: 4' 11.9" (1.521 m).   Weight as of this encounter: 88.9 kg.    GI prophylaxis: PPI  ABG: No results found for: PHART, PCO2ART, PO2ART, HCO3, TCO2, ACIDBASEDEF, O2SAT  Vent Settings: N/A  Condition - Extremely Guarded  Family Communication  :  Spouse [place] [phone])  updated over the phone 9/17  Code Status : DNR-order entered on 9/17.  Spouse made aware today.   Yesterday after speaking with spouse (via telephonic conference) while I was in the room with the patient-patient was leaning towards a DNR-I had asked her to discuss further with the spouse-and sleep on it.  This morning-she told me that she does not want to be intubated at all costs-she is aware that the alternative in that scenario would be full comfort measures/hospice care.  Diet :  Diet Order            Diet Carb Modified Fluid consistency: Thin; Room service appropriate? Yes  Diet effective now                  Disposition Plan  :   Status is: Inpatient  Remains inpatient appropriate because:Inpatient level of care appropriate due to severity of illness  Dispo: The patient is from: Home              Anticipated d/c is to: Home              Anticipated d/c date is: > 3 days              Patient currently is not medically stable to d/c.   Barriers to discharge: Hypoxia requiring O2 supplementation/complete 5 days of IV Remdesivir  Antimicorbials  :    Anti-infectives (From admission, onward)   Start     Dose/Rate Route Frequency Ordered Stop   05/28/20 1000  remdesivir 100 mg in sodium chloride 0.9 % 100 mL IVPB       "Followed by" Linked Group Details   100 mg 200 mL/hr over 30 Minutes Intravenous Every 24 hours 05/26/2020 2038 05/31/20 0920  06/05/2020 2100  remdesivir 200 mg in sodium chloride 0.9% 250 mL IVPB       "Followed by" Linked Group Details   200 mg 580 mL/hr over 30 Minutes Intravenous Once 05/26/2020  2038 05/31/2020 2225      Inpatient Medications  Scheduled Meds: . vitamin C  500 mg Oral Daily  . baricitinib  4 mg Oral QHS  . buPROPion  150 mg Oral q AM  . enoxaparin (LOVENOX) injection  40 mg Subcutaneous Q12H  . insulin aspart  0-15 Units Subcutaneous TID WC  . insulin aspart  0-5 Units Subcutaneous QHS  . insulin aspart  5 Units Subcutaneous TID WC  . insulin glargine  8 Units Subcutaneous BID  . Ipratropium-Albuterol  1 puff Inhalation Q6H  . linagliptin  5 mg Oral Daily  . methylPREDNISolone (SOLU-MEDROL) injection  40 mg Intravenous Q12H  . mometasone-formoterol  2 puff Inhalation BID  . montelukast  10 mg Oral QHS  . pantoprazole  40 mg Oral Daily  . pramipexole  1 mg Oral QHS  . senna-docusate  2 tablet Oral BID  . zinc sulfate  220 mg Oral Daily   Continuous Infusions: PRN Meds:.acetaminophen, albuterol, benzonatate, guaiFENesin-dextromethorphan, ondansetron **OR** ondansetron (ZOFRAN) IV, polyethylene glycol, senna-docusate, sodium chloride   Time Spent in minutes  35   See all Orders from today for further details   Oren Binet M.D on 06/08/2020 at 2:28 PM  To page go to [email or web address] - use universal password  [facility] -  Office  [phone]    Objective:   Vitals:   06/08/20 0801 06/08/20 0848 06/08/20 0900 06/08/20 1202  BP:  101/62  104/67  Pulse: (!) 103 99  89  Resp: (!) 22 18  20   Temp:  98.6 F (37 C)  98.1 F (36.7 C)  TempSrc:  Oral  Oral  SpO2: 90% 90%  98%  Weight:   88.9 kg     Wt Readings from Last 3 Encounters:  06/08/20 88.9 kg  05/24/20 89.3 kg  12/01/19 91.6 kg     Intake/Output Summary (Last 24 hours) at 06/08/2020 1428 Last data filed at 06/08/2020 0427 Gross per 24 hour  Intake 240 ml  Output --  Net 240 ml     Physical Exam Gen Exam:Alert awake-not in any distress HEENT:atraumatic, normocephalic Chest: B/L clear to auscultation anteriorly CVS:S1S2 regular Abdomen:soft non tender, non  distended Extremities:no edema Neurology: Non focal Skin: no rash   Data Review:    CBC Recent Labs  Lab 06/04/20 0515 06/05/20 0526 06/06/20 0750 06/07/20 0638 06/08/20 0500  WBC 14.1* 12.5* 14.3* 15.5* 14.2*  HGB 14.2 14.4 14.2 13.4 13.3  HCT 43.7 43.7 43.3 40.9 40.8  PLT 341 346 394 405* 392  MCV 89.0 90.1 90.4 88.7 89.3  MCH 28.9 29.7 29.6 29.1 29.1  MCHC 32.5 33.0 32.8 32.8 32.6  RDW 12.3 12.3 12.5 12.8 12.6    Chemistries  Recent Labs  Lab 06/04/20 0515 06/05/20 0526 06/06/20 0750 06/07/20 0638 06/08/20 0500  NA 139 136 137 138 137  K 4.3 4.8 4.4 4.3 4.5  CL 101 98 100 102 100  CO2 30 28 28 26 27   GLUCOSE 76 165* 200* 116* 157*  BUN 19 19 25* 18 22*  CREATININE 0.90 0.86 0.99 0.78 0.86  CALCIUM 8.5* 8.8* 8.8* 8.8* 8.7*  AST 27 21 21 23 23   ALT 30 25 23 22 20   ALKPHOS 76 79 75 74 74  BILITOT 1.0 1.1 0.6  0.8 0.8   ------------------------------------------------------------------------------------------------------------------ No results for input(s): CHOL, HDL, LDLCALC, TRIG, CHOLHDL, LDLDIRECT in the last 72 hours.  Lab Results  Component Value Date   HGBA1C 6.9 (H) 05/28/2020   ------------------------------------------------------------------------------------------------------------------ No results for input(s): TSH, T4TOTAL, T3FREE, THYROIDAB in the last 72 hours.  Invalid input(s): FREET3 ------------------------------------------------------------------------------------------------------------------ Recent Labs    06/07/20 0638 06/08/20 0500  FERRITIN 718* 788*    Coagulation profile No results for input(s): INR, PROTIME in the last 168 hours.  Recent Labs    06/07/20 0638 06/08/20 0500  DDIMER 1.82* 1.79*    Cardiac Enzymes No results for input(s): CKMB, TROPONINI, MYOGLOBIN in the last 168 hours.  Invalid input(s):  CK ------------------------------------------------------------------------------------------------------------------    Component Value Date/Time   BNP 37.8 06/04/2020 0515    Micro Results Recent Results (from the past 240 hour(s))  MRSA PCR Screening     Status: None   Collection Time: 05/29/20  6:35 PM   Specimen: Nasal Mucosa; Nasopharyngeal  Result Value Ref Range Status   MRSA by PCR NEGATIVE NEGATIVE Final    Comment:        The GeneXpert MRSA Assay (FDA approved for NASAL specimens only), is one component of a comprehensive MRSA colonization surveillance program. It is not intended to diagnose MRSA infection nor to guide or monitor treatment for MRSA infections. Performed at [facility], [place] [address] [place], [place] [address]     Radiology Reports CT ANGIO CHEST PE W OR WO CONTRAST  Result Date: 06/06/2020 CLINICAL DATA:  COVID-19, with hypoxia and shortness of breath, positive D dimer, suspected pulmonary embolus. EXAM: CT ANGIOGRAPHY CHEST WITH CONTRAST TECHNIQUE: Multidetector CT imaging of the chest was performed using the standard protocol during bolus administration of intravenous contrast. Multiplanar CT image reconstructions and MIPs were obtained to evaluate the vascular anatomy. CONTRAST:  28mL OMNIPAQUE IOHEXOL 350 MG/ML SOLN COMPARISON:  Chest x-ray 06/05/2020. CT abdomen pelvis 10/21/2019 FINDINGS: Cardiovascular: Satisfactory opacification of the pulmonary arteries to the segmental level. The main pulmonary artery is normal in caliber. No evidence of pulmonary embolism. Normal heart size. No significant pericardial effusion. Small to moderate volume hiatal hernia. Mediastinum/Nodes: Prominent mediastinal lymph nodes. No enlarged mediastinal, hilar, or axillary lymph nodes. Thyroid gland, trachea, and esophagus demonstrate no significant findings. Lungs/Pleura: Slightly expiratory phase of respiration. Diffuse ground-glass airspace opacity with more  consolidative opacity in bilateral lower lobes. Mosaic attenuation of the lung best appreciated in the right upper lung zone. Superimposed mild septal wall thickening as well as bilateral lower lobe mild tubular bronchiectasis. Upper Abdomen: No acute abnormality. Musculoskeletal: Nonspecific 7 mm soft tissue density within the right breast likely representing an intramammary lymph node (6:46). Nonspecific 1 cm soft tissue density within the left breast (6:36) in a patient with little glandular tissue. No acute or significant osseous findings. Review of the MIP images confirms the above findings. IMPRESSION: 1. No pulmonary embolus. 2. Mosaic attenuation and ground-glass airspace opacity with more consolidative bibasilar findings as well as tubular bronchiectasis is consistent with known COVID pneumonia with superimposed ARDS not excluded. In the setting of protracted COVID pneumonia, presence of infectious or inflammatory fibrosis is possible. Consider long-term CT follow-up. 3. Other imaging findings of potential clinical significance: Couple of soft tissue densities within bilateral breast in a patient with minimal glandular tissue; correlate with prior mammography or if clinically indicated follow-up with non-emergent mammographic evaluation. Small to moderate volume hiatal hernia. Electronically Signed   By: Iven Finn M.D.   On:  06/06/2020 16:54   DG Chest Port 1 View  Result Date: 06/05/2020 CLINICAL DATA:  COVID, hypoxia, shortness of breath EXAM: PORTABLE CHEST 1 VIEW COMPARISON:  06/03/2020 FINDINGS: Improving aeration bilaterally. Mild residual interstitial prominence and airspace disease bilaterally, left greater than right. Low lung volumes. Heart is borderline in size. No effusions or pneumothorax. IMPRESSION: Improving aeration with continued mild interstitial and alveolar opacities, left greater than right. Electronically Signed   By: Rolm Baptise M.D.   On: 06/05/2020 08:43   DG Chest  Port 1 View  Result Date: 06/03/2020 CLINICAL DATA:  Follow-up COVID-19 pneumonia. EXAM: PORTABLE CHEST 1 VIEW COMPARISON:  06/07/2020 and earlier. FINDINGS: Cardiac silhouette upper normal in size for AP portable technique. Patchy ground-glass opacities throughout the LEFT lung and in the RIGHT mid lung and RIGHT lung base are unchanged since the examination 1 week ago. No new pulmonary parenchymal abnormalities. No visible pleural effusions. IMPRESSION: Stable pneumonia throughout the LEFT lung and in the RIGHT mid lung and RIGHT lung base since the examination 1 week ago. No new abnormalities. Electronically Signed   By: Evangeline Dakin M.D.   On: 06/03/2020 09:28   DG Chest Port 1 View  Result Date: 05/24/2020 CLINICAL DATA:  Cough, COVID, hypoxia EXAM: PORTABLE CHEST 1 VIEW COMPARISON:  11/07/2018 FINDINGS: Lung volumes are small. Bibasilar asymmetric moderate pulmonary infiltrates are present in keeping with atypical infection and compatible with the given history of COVID-19 pneumonia. No pneumothorax or pleural effusion. Cardiac size within normal limits. Pulmonary vascularity is normal. No acute bone abnormality. IMPRESSION: Bibasilar asymmetric moderate pulmonary infiltrates compatible with the given history of COVID-19 pneumonia. Electronically Signed   By: Fidela Salisbury MD   On: 06/10/2020 20:23   VAS [place] LOWER EXTREMITY VENOUS (DVT)  Result Date: 06/06/2020  Lower Venous DVTStudy Indications: D-dimer.  Limitations: Patient intolerant of compressions mid-dist thigh. Comparison Study: No prior studies. Performing Technologist: Darlin Coco  Examination Guidelines: A complete evaluation includes B-mode imaging, spectral Doppler, color Doppler, and power Doppler as needed of all accessible portions of each vessel. Bilateral testing is considered an integral part of a complete examination. Limited examinations for reoccurring indications may be performed as noted. The reflux portion of the exam is  performed with the patient in reverse Trendelenburg.  +---------+---------------+---------+-----------+----------+--------------+ RIGHT    CompressibilityPhasicitySpontaneityPropertiesThrombus Aging +---------+---------------+---------+-----------+----------+--------------+ CFV      Full           Yes      Yes                                 +---------+---------------+---------+-----------+----------+--------------+ SFJ      Full                                                        +---------+---------------+---------+-----------+----------+--------------+ FV Prox  Full                                                        +---------+---------------+---------+-----------+----------+--------------+ FV Mid   Full                                                        +---------+---------------+---------+-----------+----------+--------------+  FV DistalFull                                                        +---------+---------------+---------+-----------+----------+--------------+ PFV      Full                                                        +---------+---------------+---------+-----------+----------+--------------+ POP      Full           Yes      Yes                                 +---------+---------------+---------+-----------+----------+--------------+ PTV      Full                                                        +---------+---------------+---------+-----------+----------+--------------+ PERO     Full                                                        +---------+---------------+---------+-----------+----------+--------------+   +---------+---------------+---------+-----------+----------+--------------+ LEFT     CompressibilityPhasicitySpontaneityPropertiesThrombus Aging +---------+---------------+---------+-----------+----------+--------------+ CFV      Full           Yes      Yes                                  +---------+---------------+---------+-----------+----------+--------------+ SFJ      Full                                                        +---------+---------------+---------+-----------+----------+--------------+ FV Prox  Full                                                        +---------+---------------+---------+-----------+----------+--------------+ FV Mid   Full                                                        +---------+---------------+---------+-----------+----------+--------------+ FV Distal               Yes      Yes                                 +---------+---------------+---------+-----------+----------+--------------+  PFV      Full                                                        +---------+---------------+---------+-----------+----------+--------------+ POP      Full           Yes      Yes                                 +---------+---------------+---------+-----------+----------+--------------+ PTV      Full                                                        +---------+---------------+---------+-----------+----------+--------------+ PERO     Full                                                        +---------+---------------+---------+-----------+----------+--------------+     Summary: RIGHT: - There is no evidence of deep vein thrombosis in the lower extremity.  - No cystic structure found in the popliteal fossa.  LEFT: - There is no evidence of deep vein thrombosis in the lower extremity.  - No cystic structure found in the popliteal fossa.  *See table(s) above for measurements and observations. Electronically signed by Harold Barban MD on 06/06/2020 at 5:20:35 PM.    Final

## 2020-06-09 LAB — C-REACTIVE PROTEIN: CRP: 3.2 mg/dL — ABNORMAL HIGH (ref <1.0)

## 2020-06-09 LAB — CBC
HCT: 42.3 % (ref 36.0–46.0)
Hemoglobin: 13.8 g/dL (ref 12.0–15.0)
MCH: 29.3 pg (ref 26.0–34.0)
MCHC: 32.6 g/dL (ref 30.0–36.0)
MCV: 89.8 fL (ref 80.0–100.0)
Platelets: 442 10*3/uL — ABNORMAL HIGH (ref 150–400)
RBC: 4.71 MIL/uL (ref 3.87–5.11)
RDW: 12.5 % (ref 11.5–15.5)
WBC: 18.8 10*3/uL — ABNORMAL HIGH (ref 4.0–10.5)
nRBC: 0 % (ref 0.0–0.2)

## 2020-06-09 LAB — GLUCOSE, CAPILLARY
Glucose-Capillary: 112 mg/dL — ABNORMAL HIGH (ref 70–99)
Glucose-Capillary: 163 mg/dL — ABNORMAL HIGH (ref 70–99)
Glucose-Capillary: 227 mg/dL — ABNORMAL HIGH (ref 70–99)
Glucose-Capillary: 105 mg/dL — ABNORMAL HIGH (ref 70–99)

## 2020-06-09 LAB — COMPREHENSIVE METABOLIC PANEL
ALT: 17 U/L (ref 0–44)
AST: 31 U/L (ref 15–41)
Albumin: 2.4 g/dL — ABNORMAL LOW (ref 3.5–5.0)
Alkaline Phosphatase: 78 U/L (ref 38–126)
Anion gap: 11 (ref 5–15)
BUN: 28 mg/dL — ABNORMAL HIGH (ref 6–20)
CO2: 28 mmol/L (ref 22–32)
Calcium: 8.9 mg/dL (ref 8.9–10.3)
Chloride: 99 mmol/L (ref 98–111)
Creatinine, Ser: 0.9 mg/dL (ref 0.44–1.00)
GFR calc Af Amer: >60 mL/min (ref >60)
GFR calc non Af Amer: >60 mL/min (ref >60)
Glucose, Bld: 141 mg/dL — ABNORMAL HIGH (ref 70–99)
Potassium: 5.2 mmol/L — ABNORMAL HIGH (ref 3.5–5.1)
Sodium: 138 mmol/L (ref 135–145)
Total Bilirubin: 1.8 mg/dL — ABNORMAL HIGH (ref 0.3–1.2)
Total Protein: 6.3 g/dL — ABNORMAL LOW (ref 6.5–8.1)

## 2020-06-09 LAB — D-DIMER, QUANTITATIVE: D-Dimer, Quant: 1.82 ug/mL-FEU — ABNORMAL HIGH (ref 0.00–0.50)

## 2020-06-09 MED ORDER — FUROSEMIDE 10 MG/ML IJ SOLN
40.0000 mg | Freq: Once | INTRAMUSCULAR | Status: AC
  Administered 2020-06-09: 40 mg via INTRAVENOUS
  Filled 2020-06-09: qty 4

## 2020-06-09 NOTE — Progress Notes (Addendum)
PROGRESS NOTE                                                                                                                                                                                                             Patient Demographics:    Claudia Vega, is a 59 y.o. female, DOB - 03/21/1961, EZM:[id number]  Outpatient Primary MD for the patient is Greig Right, MD   Admit date - 06/16/2020   LOS - 16  Chief Complaint  Patient presents with  . Shortness of Breath  . Cough       Brief Narrative: Patient is a 59 y.o. female with PMHx of HTN, asthma-who tested positive for COVID-19 on 9/5-admitted for worsening shortness of breath due to acute hypoxic respiratory failure requiring HFNC secondary to COVID-19 pneumonia  COVID-19 vaccinated status: Unvaccinated  Significant Events: 9/5>> Admit to [facility] for hypoxia due to COVID-19  Significant studies: 9/5>>Chest x-ray: Patchy bilateral infiltrates 9/12>>> chest x-ray: Stable pneumonia throughout left lung and right midlung 9/14>> chest x-ray: Improving aeration with continued mild interstitial and alveolar opacities-left greater than right 9/15>> CTA chest: No PE, mosaic attenuation/groundglass opacities with more consolidative bibasilar findings as well as tubular bronchiectasis.  Soft tissue densities in the bilateral breasts. 9/15>> bilateral lower extremity Doppler: No DVT.  COVID-19 medications: Steroids:9/5>> Remdesivir:9/5>>9/9 Baricitinib:9/5>>  Antibiotics: None  Microbiology data: 9/5>>Blood culture:no growth  Procedures: None  Consults: None  DVT prophylaxis: Changed back to daily dosing of Lovenox given development of epistaxis on 9/17.   Subjective:   No major events overnight-now on 15 L of HFNC and NRB.   Assessment  & Plan :   Acute Hypoxic Resp Failure due to Covid 19 Viral pneumonia: Some improvement in hypoxemia-no longer on  heated high flow and NRB-has been switched to 15 L of salter high flow along with NRB.  Repeat IV Lasix today to keep in negative balance.  Continue to monitor closely.    Fever: afebrile O2 requirements:  SpO2: (!) 85 % O2 Flow Rate (L/min): 15 L/min (HFNC Salter 15L) FiO2 (%): 100 %   COVID-19 Labs: Recent Labs    06/07/20 0638 06/08/20 0500 06/09/20 0248  DDIMER 1.82* 1.79* 1.82*  FERRITIN 718* 788*  --   CRP 3.8* 4.6* 3.2*       Component Value Date/Time   BNP 37.8  06/04/2020 0515    Recent Labs  Lab 06/04/20 0515 06/07/20 0731  PROCALCITON <0.10 <0.10    Lab Results  Component Value Date   SARSCOV2NAA POSITIVE (A) 06/09/2020     Prone/Incentive Spirometry: encouraged patient to lie prone for 3-4 hours at a time for a total of 16 hours a day, and to encourage incentive spirometry use 3-4/hour.  Epistaxis: Occurred on 9/17-very mild-hardly any epistaxis today.  Continue Afrin x1 more day.  DM-2 (A1c 6.9 on 9/6) with steroid-induced hyperglycemia: CBGs stable-remains on Lantus 8 units twice daily, 5 units of NovoLog and SSI.  Follow and adjust.   Recent Labs    06/08/20 2049 06/09/20 0738 06/09/20 1140  GLUCAP 123* 112* 163*    HTN: BP soft-no longer on Cardizem.  Resume when able.  Bronchial asthma: No wheezing-stable-continue inhaler regimen  Morbid Obesity: Estimated body mass index is 38.41 kg/m as calculated from the following:   Height as of 05/24/20: 4' 11.9" (1.521 m).   Weight as of this encounter: 88.9 kg.    GI prophylaxis: PPI  ABG: No results found for: PHART, PCO2ART, PO2ART, HCO3, TCO2, ACIDBASEDEF, O2SAT  Vent Settings: N/A  Condition - Extremely Guarded  Family Communication  :  Spouse Nelline Lio [phone])  updated over the phone 9/18  Code Status : Upon further clarification with patient-CODE STATUS changed to DO NOT INTUBATE.    Diet :  Diet Order            Diet Carb Modified Fluid consistency: Thin; Room service  appropriate? Yes  Diet effective now                  Disposition Plan  :   Status is: Inpatient  Remains inpatient appropriate because:Inpatient level of care appropriate due to severity of illness  Dispo: The patient is from: Home              Anticipated d/c is to: Home              Anticipated d/c date is: > 3 days              Patient currently is not medically stable to d/c.   Barriers to discharge: Hypoxia requiring O2 supplementation/complete 5 days of IV Remdesivir  Antimicorbials  :    Anti-infectives (From admission, onward)   Start     Dose/Rate Route Frequency Ordered Stop   05/28/20 1000  remdesivir 100 mg in sodium chloride 0.9 % 100 mL IVPB       "Followed by" Linked Group Details   100 mg 200 mL/hr over 30 Minutes Intravenous Every 24 hours 06/01/2020 2038 05/31/20 0920   06/20/2020 2100  remdesivir 200 mg in sodium chloride 0.9% 250 mL IVPB       "Followed by" Linked Group Details   200 mg 580 mL/hr over 30 Minutes Intravenous Once 06/19/2020 2038 05/24/2020 2225      Inpatient Medications  Scheduled Meds: . vitamin C  500 mg Oral Daily  . baricitinib  4 mg Oral QHS  . buPROPion  150 mg Oral q AM  . enoxaparin (LOVENOX) injection  40 mg Subcutaneous Q24H  . insulin aspart  0-15 Units Subcutaneous TID WC  . insulin aspart  0-5 Units Subcutaneous QHS  . insulin aspart  5 Units Subcutaneous TID WC  . insulin glargine  8 Units Subcutaneous BID  . Ipratropium-Albuterol  1 puff Inhalation Q6H  . linagliptin  5 mg Oral Daily  .  methylPREDNISolone (SOLU-MEDROL) injection  40 mg Intravenous Q12H  . mometasone-formoterol  2 puff Inhalation BID  . montelukast  10 mg Oral QHS  . oxymetazoline  3 spray Each Nare TID  . pantoprazole  40 mg Oral Daily  . pramipexole  1 mg Oral QHS  . senna-docusate  2 tablet Oral BID  . zinc sulfate  220 mg Oral Daily   Continuous Infusions: PRN Meds:.acetaminophen, albuterol, benzonatate, guaiFENesin-dextromethorphan, ondansetron  **OR** ondansetron (ZOFRAN) IV, polyethylene glycol, senna-docusate, sodium chloride   Time Spent in minutes  35   See all Orders from today for further details   Oren Binet M.D on 06/09/2020 at 12:48 PM  To page go to [email or web address] - use universal password  [facility] -  Office  [phone]    Objective:   Vitals:   06/09/20 0408 06/09/20 0818 06/09/20 0852 06/09/20 1212  BP: (!) 142/86 102/78  118/82  Pulse: 100   99  Resp: 20 (!) 22  (!) 31  Temp: 98.2 F (36.8 C) 97.9 F (36.6 C)  97.8 F (36.6 C)  TempSrc: Oral Axillary  Axillary  SpO2: 90%  (!) 77% (!) 85%  Weight:        Wt Readings from Last 3 Encounters:  06/08/20 88.9 kg  05/24/20 89.3 kg  12/01/19 91.6 kg     Intake/Output Summary (Last 24 hours) at 06/09/2020 1248 Last data filed at 06/09/2020 1007 Gross per 24 hour  Intake 480 ml  Output --  Net 480 ml     Physical Exam Gen Exam:Alert awake-not in any distress HEENT:atraumatic, normocephalic Chest: B/L clear to auscultation anteriorly CVS:S1S2 regular Abdomen:soft non tender, non distended Extremities:no edema Neurology: Non focal Skin: no rash   Data Review:    CBC Recent Labs  Lab 06/05/20 0526 06/06/20 0750 06/07/20 0638 06/08/20 0500 06/09/20 0248  WBC 12.5* 14.3* 15.5* 14.2* 18.8*  HGB 14.4 14.2 13.4 13.3 13.8  HCT 43.7 43.3 40.9 40.8 42.3  PLT 346 394 405* 392 442*  MCV 90.1 90.4 88.7 89.3 89.8  MCH 29.7 29.6 29.1 29.1 29.3  MCHC 33.0 32.8 32.8 32.6 32.6  RDW 12.3 12.5 12.8 12.6 12.5    Chemistries  Recent Labs  Lab 06/05/20 0526 06/06/20 0750 06/07/20 0638 06/08/20 0500 06/09/20 0248  NA 136 137 138 137 138  K 4.8 4.4 4.3 4.5 5.2*  CL 98 100 102 100 99  CO2 28 28 26 27 28   GLUCOSE 165* 200* 116* 157* 141*  BUN 19 25* 18 22* 28*  CREATININE 0.86 0.99 0.78 0.86 0.90  CALCIUM 8.8* 8.8* 8.8* 8.7* 8.9  AST 21 21 23 23 31   ALT 25 23 22 20 17   ALKPHOS 79 75 74 74 78  BILITOT 1.1 0.6 0.8 0.8 1.8*    ------------------------------------------------------------------------------------------------------------------ No results for input(s): CHOL, HDL, LDLCALC, TRIG, CHOLHDL, LDLDIRECT in the last 72 hours.  Lab Results  Component Value Date   HGBA1C 6.9 (H) 05/28/2020   ------------------------------------------------------------------------------------------------------------------ No results for input(s): TSH, T4TOTAL, T3FREE, THYROIDAB in the last 72 hours.  Invalid input(s): FREET3 ------------------------------------------------------------------------------------------------------------------ Recent Labs    06/07/20 0638 06/08/20 0500  FERRITIN 718* 788*    Coagulation profile No results for input(s): INR, PROTIME in the last 168 hours.  Recent Labs    06/08/20 0500 06/09/20 0248  DDIMER 1.79* 1.82*    Cardiac Enzymes No results for input(s): CKMB, TROPONINI, MYOGLOBIN in the last 168 hours.  Invalid input(s): CK ------------------------------------------------------------------------------------------------------------------    Component Value Date/Time  BNP 37.8 06/04/2020 0515    Micro Results No results found for this or any previous visit (from the past 240 hour(s)).  Radiology Reports CT ANGIO CHEST PE W OR WO CONTRAST  Result Date: 06/06/2020 CLINICAL DATA:  COVID-19, with hypoxia and shortness of breath, positive D dimer, suspected pulmonary embolus. EXAM: CT ANGIOGRAPHY CHEST WITH CONTRAST TECHNIQUE: Multidetector CT imaging of the chest was performed using the standard protocol during bolus administration of intravenous contrast. Multiplanar CT image reconstructions and MIPs were obtained to evaluate the vascular anatomy. CONTRAST:  17mL OMNIPAQUE IOHEXOL 350 MG/ML SOLN COMPARISON:  Chest x-ray 06/05/2020. CT abdomen pelvis 10/21/2019 FINDINGS: Cardiovascular: Satisfactory opacification of the pulmonary arteries to the segmental level. The main  pulmonary artery is normal in caliber. No evidence of pulmonary embolism. Normal heart size. No significant pericardial effusion. Small to moderate volume hiatal hernia. Mediastinum/Nodes: Prominent mediastinal lymph nodes. No enlarged mediastinal, hilar, or axillary lymph nodes. Thyroid gland, trachea, and esophagus demonstrate no significant findings. Lungs/Pleura: Slightly expiratory phase of respiration. Diffuse ground-glass airspace opacity with more consolidative opacity in bilateral lower lobes. Mosaic attenuation of the lung best appreciated in the right upper lung zone. Superimposed mild septal wall thickening as well as bilateral lower lobe mild tubular bronchiectasis. Upper Abdomen: No acute abnormality. Musculoskeletal: Nonspecific 7 mm soft tissue density within the right breast likely representing an intramammary lymph node (6:46). Nonspecific 1 cm soft tissue density within the left breast (6:36) in a patient with little glandular tissue. No acute or significant osseous findings. Review of the MIP images confirms the above findings. IMPRESSION: 1. No pulmonary embolus. 2. Mosaic attenuation and ground-glass airspace opacity with more consolidative bibasilar findings as well as tubular bronchiectasis is consistent with known COVID pneumonia with superimposed ARDS not excluded. In the setting of protracted COVID pneumonia, presence of infectious or inflammatory fibrosis is possible. Consider long-term CT follow-up. 3. Other imaging findings of potential clinical significance: Couple of soft tissue densities within bilateral breast in a patient with minimal glandular tissue; correlate with prior mammography or if clinically indicated follow-up with non-emergent mammographic evaluation. Small to moderate volume hiatal hernia. Electronically Signed   By: Iven Finn M.D.   On: 06/06/2020 16:54   DG Chest Port 1 View  Result Date: 06/05/2020 CLINICAL DATA:  COVID, hypoxia, shortness of breath EXAM:  PORTABLE CHEST 1 VIEW COMPARISON:  06/03/2020 FINDINGS: Improving aeration bilaterally. Mild residual interstitial prominence and airspace disease bilaterally, left greater than right. Low lung volumes. Heart is borderline in size. No effusions or pneumothorax. IMPRESSION: Improving aeration with continued mild interstitial and alveolar opacities, left greater than right. Electronically Signed   By: Rolm Baptise M.D.   On: 06/05/2020 08:43   DG Chest Port 1 View  Result Date: 06/03/2020 CLINICAL DATA:  Follow-up COVID-19 pneumonia. EXAM: PORTABLE CHEST 1 VIEW COMPARISON:  06/03/2020 and earlier. FINDINGS: Cardiac silhouette upper normal in size for AP portable technique. Patchy ground-glass opacities throughout the LEFT lung and in the RIGHT mid lung and RIGHT lung base are unchanged since the examination 1 week ago. No new pulmonary parenchymal abnormalities. No visible pleural effusions. IMPRESSION: Stable pneumonia throughout the LEFT lung and in the RIGHT mid lung and RIGHT lung base since the examination 1 week ago. No new abnormalities. Electronically Signed   By: Evangeline Dakin M.D.   On: 06/03/2020 09:28   DG Chest Port 1 View  Result Date: 05/28/2020 CLINICAL DATA:  Cough, COVID, hypoxia EXAM: PORTABLE CHEST 1 VIEW COMPARISON:  11/07/2018  FINDINGS: Lung volumes are small. Bibasilar asymmetric moderate pulmonary infiltrates are present in keeping with atypical infection and compatible with the given history of COVID-19 pneumonia. No pneumothorax or pleural effusion. Cardiac size within normal limits. Pulmonary vascularity is normal. No acute bone abnormality. IMPRESSION: Bibasilar asymmetric moderate pulmonary infiltrates compatible with the given history of COVID-19 pneumonia. Electronically Signed   By: Fidela Salisbury MD   On: 05/24/2020 20:23   VAS [place] LOWER EXTREMITY VENOUS (DVT)  Result Date: 06/06/2020  Lower Venous DVTStudy Indications: D-dimer.  Limitations: Patient intolerant of  compressions mid-dist thigh. Comparison Study: No prior studies. Performing Technologist: Darlin Coco  Examination Guidelines: A complete evaluation includes B-mode imaging, spectral Doppler, color Doppler, and power Doppler as needed of all accessible portions of each vessel. Bilateral testing is considered an integral part of a complete examination. Limited examinations for reoccurring indications may be performed as noted. The reflux portion of the exam is performed with the patient in reverse Trendelenburg.  +---------+---------------+---------+-----------+----------+--------------+ RIGHT    CompressibilityPhasicitySpontaneityPropertiesThrombus Aging +---------+---------------+---------+-----------+----------+--------------+ CFV      Full           Yes      Yes                                 +---------+---------------+---------+-----------+----------+--------------+ SFJ      Full                                                        +---------+---------------+---------+-----------+----------+--------------+ FV Prox  Full                                                        +---------+---------------+---------+-----------+----------+--------------+ FV Mid   Full                                                        +---------+---------------+---------+-----------+----------+--------------+ FV DistalFull                                                        +---------+---------------+---------+-----------+----------+--------------+ PFV      Full                                                        +---------+---------------+---------+-----------+----------+--------------+ POP      Full           Yes      Yes                                 +---------+---------------+---------+-----------+----------+--------------+ PTV  Full                                                         +---------+---------------+---------+-----------+----------+--------------+ PERO     Full                                                        +---------+---------------+---------+-----------+----------+--------------+   +---------+---------------+---------+-----------+----------+--------------+ LEFT     CompressibilityPhasicitySpontaneityPropertiesThrombus Aging +---------+---------------+---------+-----------+----------+--------------+ CFV      Full           Yes      Yes                                 +---------+---------------+---------+-----------+----------+--------------+ SFJ      Full                                                        +---------+---------------+---------+-----------+----------+--------------+ FV Prox  Full                                                        +---------+---------------+---------+-----------+----------+--------------+ FV Mid   Full                                                        +---------+---------------+---------+-----------+----------+--------------+ FV Distal               Yes      Yes                                 +---------+---------------+---------+-----------+----------+--------------+ PFV      Full                                                        +---------+---------------+---------+-----------+----------+--------------+ POP      Full           Yes      Yes                                 +---------+---------------+---------+-----------+----------+--------------+ PTV      Full                                                        +---------+---------------+---------+-----------+----------+--------------+  PERO     Full                                                        +---------+---------------+---------+-----------+----------+--------------+     Summary: RIGHT: - There is no evidence of deep vein thrombosis in the lower extremity.  - No cystic structure found in  the popliteal fossa.  LEFT: - There is no evidence of deep vein thrombosis in the lower extremity.  - No cystic structure found in the popliteal fossa.  *See table(s) above for measurements and observations. Electronically signed by Harold Barban MD on 06/06/2020 at 5:20:35 PM.    Final

## 2020-06-09 NOTE — Progress Notes (Addendum)
   06/09/20 2026  Vitals  Temp 98.5 F (36.9 C)  Temp Source Oral  BP 108/78  MAP (mmHg) 86  BP Location Right Arm  BP Method Automatic  Patient Position (if appropriate) Lying  Pulse Rate (!) 106  Pulse Rate Source Monitor  ECG Heart Rate (!) 107  Resp (!) 21  Level of Consciousness  Level of Consciousness Alert  MEWS COLOR  MEWS Score Color Yellow  Oxygen Therapy  SpO2 (!) 88 %  O2 Device HFNC;Non-rebreather Mask  O2 Flow Rate (L/min) 40 L/min (25L HFNC; 15 NRB)  FiO2 (%) 100 %  Patient Activity (if Appropriate) In bed  Pulse Oximetry Type Continuous  Oximetry Probe Site Changed Yes  MEWS Score  MEWS Temp 0  MEWS Systolic 0  MEWS Pulse 1  MEWS RR 1  MEWS LOC 0  MEWS Score 2   Patient observed to have O2 between 70-75% at rest on 15L high flow and 15L NRB; patient would destat to low 60s when coughing or attempting to roll in bed.  Patient denies feeling short of breath, no alterations in mentation noted.  Patient was previously a yellow MEWS from prior shift.  Yellow MEWS triggered by increased respiration rate and heart rate.  Patient repositioned and placed in side lying position.  Respiratory therapy paged.  Patient given her scheduled inhalers, plus IV steroids with PRN cough medications and tylenol.  Patient O2 increased to 25L on heated high flow at 100%.  O2 improved to 88%. Lexine Baton, Agricultural consultant in room with patient to assist with direct patient care and is aware of situation. Respiratory therapy notified and aware of increased oxygen demands. Oncall MD notified of situation and that patient has been switched back to heated high flow.

## 2020-06-10 ENCOUNTER — Inpatient Hospital Stay (HOSPITAL_COMMUNITY): Payer: 59

## 2020-06-10 LAB — CBC
HCT: 42.5 % (ref 36.0–46.0)
Hemoglobin: 14 g/dL (ref 12.0–15.0)
MCH: 29.4 pg (ref 26.0–34.0)
MCHC: 32.9 g/dL (ref 30.0–36.0)
MCV: 89.1 fL (ref 80.0–100.0)
Platelets: 453 10*3/uL — ABNORMAL HIGH (ref 150–400)
RBC: 4.77 MIL/uL (ref 3.87–5.11)
RDW: 12.5 % (ref 11.5–15.5)
WBC: 19.1 10*3/uL — ABNORMAL HIGH (ref 4.0–10.5)
nRBC: 0 % (ref 0.0–0.2)

## 2020-06-10 LAB — COMPREHENSIVE METABOLIC PANEL
ALT: 24 U/L (ref 0–44)
AST: 22 U/L (ref 15–41)
Albumin: 2.6 g/dL — ABNORMAL LOW (ref 3.5–5.0)
Alkaline Phosphatase: 81 U/L (ref 38–126)
Anion gap: 16 — ABNORMAL HIGH (ref 5–15)
BUN: 39 mg/dL — ABNORMAL HIGH (ref 6–20)
CO2: 23 mmol/L (ref 22–32)
Calcium: 8.9 mg/dL (ref 8.9–10.3)
Chloride: 98 mmol/L (ref 98–111)
Creatinine, Ser: 1 mg/dL (ref 0.44–1.00)
GFR calc Af Amer: >60 mL/min (ref >60)
GFR calc non Af Amer: >60 mL/min (ref >60)
Glucose, Bld: 210 mg/dL — ABNORMAL HIGH (ref 70–99)
Potassium: 4.3 mmol/L (ref 3.5–5.1)
Sodium: 137 mmol/L (ref 135–145)
Total Bilirubin: 1 mg/dL (ref 0.3–1.2)
Total Protein: 6.5 g/dL (ref 6.5–8.1)

## 2020-06-10 LAB — GLUCOSE, CAPILLARY
Glucose-Capillary: 128 mg/dL — ABNORMAL HIGH (ref 70–99)
Glucose-Capillary: 118 mg/dL — ABNORMAL HIGH (ref 70–99)
Glucose-Capillary: 116 mg/dL — ABNORMAL HIGH (ref 70–99)

## 2020-06-10 LAB — C-REACTIVE PROTEIN: CRP: 1.6 mg/dL — ABNORMAL HIGH (ref <1.0)

## 2020-06-10 LAB — D-DIMER, QUANTITATIVE: D-Dimer, Quant: 1.46 ug/mL-FEU — ABNORMAL HIGH (ref 0.00–0.50)

## 2020-06-10 LAB — PROCALCITONIN: Procalcitonin: <0.1 ng/mL

## 2020-06-10 MED ORDER — SODIUM CHLORIDE 0.9 % IV SOLN
2.0000 g | Freq: Three times a day (TID) | INTRAVENOUS | Status: AC
  Administered 2020-06-10 – 2020-06-15 (×15): 2 g via INTRAVENOUS
  Filled 2020-06-10 (×15): qty 2

## 2020-06-10 MED ORDER — PREDNISONE 20 MG PO TABS: 40.0000 mg | ORAL_TABLET | Freq: Every day | ORAL | Status: DC

## 2020-06-10 MED ORDER — METHYLPREDNISOLONE SODIUM SUCC 125 MG IJ SOLR
90.0000 mg | Freq: Two times a day (BID) | INTRAMUSCULAR | Status: DC
  Administered 2020-06-10 – 2020-06-11 (×2): 90 mg via INTRAVENOUS
  Filled 2020-06-10 (×2): qty 2

## 2020-06-10 NOTE — Progress Notes (Signed)
PCCM interval progress note:  Pt rounded on via video with Dr. Elsworth Soho, resting on Bipap with sats 88% and awake.  Continue to monitor for worsening respiratory status overnight.  Otilio Carpen Tameisha Covell, PA-C

## 2020-06-10 NOTE — Progress Notes (Signed)
Pharmacy Antibiotic Note  Claudia Vega is a 59 y.o. female admitted on 06/01/2020 with pneumonia.  Pharmacy has been consulted for Cefepime dosing. CrCL 59 ml/min  Plan: Cefepime 2 grams IV q8hr x 5 days Monitor renal function and clinical status  Weight: 88.9 kg (196 lb)  Temp (24hrs), Avg:98 F (36.7 C), Min:97.6 F (36.4 C), Max:98.5 F (36.9 C)  Recent Labs  Lab 06/06/20 0750 06/07/20 0638 06/08/20 0500 06/09/20 0248 06/10/20 0251  WBC 14.3* 15.5* 14.2* 18.8* 19.1*  CREATININE 0.99 0.78 0.86 0.90 1.00    Estimated Creatinine Clearance: 59.2 mL/min (by C-G formula based on SCr of 1 mg/dL).    Allergies  Allergen Reactions  . Latex Rash and Anaphylaxis  . Other     BEE STINGS  . Avelox [Moxifloxacin Hcl In Nacl] Itching and Rash    Antimicrobials this admission: Cefep 9/19 >>  Thank you for allowing pharmacy to be a part of this patient's care.  Alanda Slim, PharmD, [facility] Clinical Pharmacist Please see AMION for all Pharmacists' Contact Phone Numbers 06/10/2020, 10:19 AM

## 2020-06-10 NOTE — Progress Notes (Signed)
Asked to assist with transfer of patient from 5W03 to 3M09.  Patient transported on 15L HFNC and NRB and oxygen sats 91% on arrival. RT at bedside and Palisade placed on patient prior to moving to other bed.

## 2020-06-10 NOTE — Progress Notes (Signed)
Worsening hypoxemia-Now on 60 L Heated high flow along with NRB  Spoke at length with patient-she is caught in two minds-"doesn't want to die" but if I go on a ventilator "my lungs will pop open". She is not sure about her DNI status and is really on the fence.   I subsequently consulted PCCM-Dr Mcquaid-who came up to bedside to evaluate patient-spoke to her at length, with recommendations to transfer to the ICU-for closer monitoring-restart IV solumedrol. Since patient unsure about code status-have switched her back to Full Code for now.   I have update her husband Linna Hoff this evening about the events-tel [phone]

## 2020-06-10 NOTE — Progress Notes (Signed)
   06/10/20 0217  Assess: MEWS Score  Temp 97.7 F (36.5 C)  BP 112/70  Pulse Rate 92  ECG Heart Rate 90  Resp (!) 21  Level of Consciousness Alert  SpO2 90 %  O2 Device HFNC;Non-rebreather Mask  Patient Activity (if Appropriate) In bed  O2 Flow Rate (L/min) 25 L/min  FiO2 (%) 100 %  Assess: MEWS Score  MEWS Temp 0  MEWS Systolic 0  MEWS Pulse 0  MEWS RR 1  MEWS LOC 0  MEWS Score 1  MEWS Score Color Green  Treat  Pain Scale 0-10  Pain Score 0  Document  Patient Outcome Stabilized after interventions  Progress note created (see row info) Yes   V/S assessed and stable.  Patient's MEWS score has returned to green.  Patient currently remains in prone position.  PRN inhalers administered.  Patient observed resting quietly in bed, easily rousable to voice and touch.  Patient remains on HFNC and NRB.

## 2020-06-10 NOTE — Consult Note (Signed)
NAME:  Claudia Vega, MRN:  [id number], DOB:  [birth year], LOS: 74 ADMISSION DATE:  06/01/2020, CONSULTATION DATE: September 19 REFERRING MD: Dr. Noah Delaine, CHIEF COMPLAINT: Dyspnea  Brief History   59 year old female admitted for Covid pneumonia on May 27, 2020 treated with remdesivir, Solu-Medrol, baricitinib developed worsening hypoxemia on June 10, 2020 so pulmonary and critical care medicine was consulted.  History of present illness   See details above.  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  She presented with worsening shortness of breath, hypoxemia+ and cough.  She was treated with steroids, remdesivir, and baricitinib.  She had a CT scan of her chest on September 15 which showed no evidence of a pulmonary embolism however she had diffuse groundglass opacification bilaterally.  Throughout her hospitalization she has had fairly severe hypoxemia though on September 18 she was weaned to a salter nasal cannula briefly.  However on September 19 her oxygenation worsened and she required heated high flow and a nonrebreather suddenly.  She denies shortness of breath, cough today or pain of any kind.  She has been diuresed aggressively during this hospital visit.  She was started on antibiotics today.  At this time she is unclear as to whether or not she would want full mechanical ventilatory support.  She said she is not ready to die as she feels her body is not at that point yet though she is reluctant to go on mechanical ventilation.  She is interested in trying noninvasive mechanical ventilation as she has used a CPAP machine in the past.  Past Medical History  Asthma Migraine GERD  Significant Hospital Events   September 5 admission September 17 epistaxis while on full dose anticoagulation, adjusted back to prophylactic  dosing September 19 transfer to ICU  Consults:  Pulmonary and critical care medicine  Procedures:    Significant Diagnostic Tests:  September 15 CT angiogram chest images independently reviewed showing no pulmonary embolism, diffuse bilateral airspace disease  Micro Data:  September 5 SARS-CoV-2 positive September 5 blood culture negative  Antimicrobials:  September 5 remdesivir> September 9 September 5 Solu-Medrol> tapered to prednisone current September 5 baricitinib> September 18  Interim history/subjective:    Objective   Blood pressure 99/79, pulse 100, temperature 99 F (37.2 C), temperature source Temporal, resp. rate (!) 22, weight 88.9 kg, SpO2 90 %.    FiO2 (%):  [100 %] 100 %   Intake/Output Summary (Last 24 hours) at 06/10/2020 1847 Last data filed at 06/10/2020 0900 Gross per 24 hour  Intake 480 ml  Output --  Net 480 ml   Filed Weights   06/08/20 0900  Weight: 88.9 kg    Examination:  General:  Resting comfortably in bed on left side, NRB mask in place HENT: NCAT OP clear PULM: CTA B, normal effort CV: RRR, no mgr GI: BS+, soft, nontender MSK: normal bulk and tone Neuro: awake, alert, no distress, MAEW    Resolved Hospital Problem list     Assessment & Plan:  SARS-CoV-2 pneumonia with severe acute respiratory failure with hypoxemia due to ARDS: Worsening hypoxemia in the last 24 hours.  Differential diagnosis includes healthcare associated pneumonia versus worsening scarring from ARDS.  Given the fact that she has been sick for a month and she is age 31 she would likely not do very well with mechanical ventilation at this point.  She does not want a go on a ventilator however she is not ready to be DNR as she says she is not ready to die so she is still contemplating whether or not she would want invasive mechanical ventilation. Admit to ICU Increase steroids to Solu-Medrol again 90 mg every 12 hours Continue healthcare associated pneumonia  antibiotics as started today Noninvasive mechanical ventilation to start in ICU, hopefully by morning she can taper back to heated high flow and nonrebreather for meals, conversation etc. 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8 42.3 42.5  MCV 90.4 88.7 89.3 89.8 89.1  PLT 394 405* 392 442* 453*    Basic Metabolic Panel: Recent Labs  Lab 06/06/20 0750 06/07/20 0638 06/08/20 0500 06/09/20 0248 06/10/20 0251  NA 137 138 137 138 137  K 4.4 4.3 4.5 5.2* 4.3  CL 100 102 100 99 98  CO2 28 26 27 28 23   GLUCOSE 200* 116* 157* 141* 210*  BUN 25* 18 22* 28* 39*  CREATININE 0.99 0.78 0.86 0.90 1.00  CALCIUM 8.8* 8.8* 8.7* 8.9 8.9   GFR: Estimated Creatinine Clearance: 59.2 mL/min (by C-G formula based on SCr of 1 mg/dL). Recent Labs  Lab 06/04/20 0515 06/05/20 0526 06/07/20 9163 06/07/20 0731 06/08/20 0500 06/09/20 0248 06/10/20 0251  PROCALCITON <0.10  --   --  <0.10  --   --  <0.10  WBC 14.1*   < > 15.5*  --  14.2* 18.8* 19.1*   < > = values in this interval not displayed.    Liver Function Tests: Recent Labs  Lab 06/06/20 0750  06/07/20 0638 06/08/20 0500 06/09/20 0248 06/10/20 0251  AST 21 23 23 31 22   ALT 23 22 20 17 24   ALKPHOS 75 74 74 78 81  BILITOT 0.6 0.8 0.8 1.8* 1.0  PROT 6.1* 6.1* 6.0* 6.3* 6.5  ALBUMIN 2.5* 2.4* 2.3* 2.4* 2.6*   No results for input(s): LIPASE, AMYLASE in the last 168 hours. No results for input(s): AMMONIA in the last 168 hours.  ABG No results found for: PHART, PCO2ART, PO2ART, HCO3, TCO2, ACIDBASEDEF, O2SAT   Coagulation Profile: No results for input(s): INR, PROTIME in the last 168 hours.  Cardiac Enzymes: No results for input(s): CKTOTAL, CKMB, CKMBINDEX, TROPONINI in the last 168 hours.  HbA1C: Hemoglobin A1C  Date/Time Value Ref Range Status  07/10/2012 06:44 PM 5.8  Final   Hgb A1c MFr Bld  Date/Time Value Ref Range Status  05/28/2020 04:00 AM 6.9 (H) 4.8 - 5.6 % Final    Comment:    (NOTE) Pre diabetes:          5.7%-6.4%  Diabetes:              >  6.4%  Glycemic control for   <7.0% adults with diabetes     CBG: Recent Labs  Lab 06/09/20 1619 06/09/20 2119 06/10/20 0735 06/10/20 1214 06/10/20 1627  GLUCAP 227* 105* 128* 118* 116*    Review of Systems:   Gen: Denies fever, chills, weight change,+ fatigue, night sweats HEENT: Denies blurred vision, double vision, hearing loss, tinnitus, sinus congestion, rhinorrhea, sore throat, neck stiffness, dysphagia PULM: per HPI CV: Denies chest pain, edema, orthopnea, paroxysmal nocturnal dyspnea, palpitations GI: Denies abdominal pain, nausea, vomiting, diarrhea, hematochezia, melena, constipation, change in bowel habits GU: Denies dysuria, hematuria, polyuria, oliguria, urethral discharge Endocrine: Denies hot or cold intolerance, polyuria, polyphagia or appetite change Derm: Denies rash, dry skin, scaling or peeling skin change Heme: Denies easy bruising, bleeding, bleeding gums Neuro: Denies headache, numbness, weakness, slurred speech, loss of memory or consciousness   Past Medical History  She,   has a past medical history of Acid reflux, Anxiety, Asthma, Migraine, Restless leg syndrome, Skin cancer, basal cell, and Tachycardia.   Surgical History    Past Surgical History:  Procedure Laterality Date  . ABDOMINAL HYSTERECTOMY    . TONSILLECTOMY       Social History   reports that she has never smoked. She has never used smokeless tobacco. She reports current alcohol use. She reports that she does not use drugs.   Family History   Her family history includes Hypertension in her maternal grandmother and mother. There is no history of CAD.   Allergies Allergies  Allergen Reactions  . Latex Rash and Anaphylaxis  . Other     BEE STINGS  . Avelox [Moxifloxacin Hcl In Nacl] Itching and Rash     Home Medications  Prior to Admission medications   Medication Sig Start Date End Date Taking? Authorizing Provider  albuterol (PROAIR HFA) 108 (90 Base) MCG/ACT inhaler Inhale two puffs every four to six hours as needed for cough or wheeze. Patient taking differently: Inhale 2 puffs into the lungs every 4 (four) hours as needed for wheezing or shortness of breath.  01/14/18  Yes Kozlow, Donnamarie Poag, MD  beclomethasone (QVAR REDIHALER) 80 MCG/ACT inhaler Inhale two puffs one to two times daily during asthma flare-up as directed.  Rinse, gargle, and spit after use. Patient taking differently: Inhale 2 puffs into the lungs 2 (two) times daily. Rinse, gargle, and spit after use. 05/21/20  Yes Kozlow, Donnamarie Poag, MD  budesonide-formoterol (SYMBICORT) 160-4.5 MCG/ACT inhaler INHALE TWO PUFFS USING SPACER TWICE DAILY TO PREVENT COUGH OR WHEEZE. RINSE, GARGLE, AND SPIT AFTER USE. Patient taking differently: Inhale 2 puffs into the lungs in the morning and at bedtime. RINSE, GARGLE, AND SPIT AFTER USE. 01/09/20  Yes Kozlow, Donnamarie Poag, MD  buPROPion (WELLBUTRIN XL) 150 MG 24 hr tablet Take 150 mg by mouth in the morning.  01/04/20  Yes [provider]  diltiazem (CARDIZEM CD) 120 MG 24 hr capsule Take 120  mg by mouth at bedtime.  08/12/16  Yes [provider]  EPINEPHrine 0.3 mg/0.3 mL IJ SOAJ injection Inject 0.3 mLs (0.3 mg total) into the muscle as needed for anaphylaxis. 01/05/20  Yes Kozlow, Donnamarie Poag, MD  fluticasone (FLONASE) 50 MCG/ACT nasal spray USE ONE TO TWO SPRAYS IN EACH NOSTRIL ONCE DAILY. Patient taking differently: Place 1-2 sprays into both nostrils daily.  11/23/19  Yes Kozlow, Donnamarie Poag, MD  hydrochlorothiazide (MICROZIDE) 12.5 MG capsule Take 12.5 mg by mouth daily.  01/11/19  Yes [provider]  ipratropium-albuterol (DUONEB)  0.5-2.5 (3) MG/3ML SOLN Take 3 mLs by nebulization every 4 (four) hours as needed. Patient taking differently: Take 3 mLs by nebulization every 4 (four) hours as needed (shortness of breath and wheezing).  11/07/18  Yes Malvin Johns, MD  montelukast (SINGULAIR) 10 MG tablet TAKE 1 TABLET BY MOUTH EVERYDAY AT BEDTIME 05/21/20  Yes Kozlow, Donnamarie Poag, MD  pantoprazole (PROTONIX) 40 MG tablet Take one tablet by mouth once daily.  Increase to one tablet twice daily during flare-up. Patient taking differently: Take 40 mg by mouth 2 (two) times daily.  11/25/18  Yes Kozlow, Donnamarie Poag, MD  pramipexole (MIRAPEX) 1 MG tablet Take 1 mg by mouth at bedtime.   Yes [provider]  tretinoin (RETIN-A) 0.05 % cream Apply 1 application topically at bedtime.  04/10/20  Yes [provider]  UBRELVY 100 MG TABS Take 100 mg by mouth daily as needed (stress induced migraine).  01/23/20  Yes [provider]     Critical care time: 35 minutes    Roselie Awkward, MD [facility] Pager: [phone] Cell: [phone] If no response, call [phone]

## 2020-06-10 NOTE — Progress Notes (Signed)
PROGRESS NOTE                                                                                                                                                                                                             Patient Demographics:    Claudia Vega, is a 59 y.o. female, DOB - 02/27/1961, OTL:[id number]  Outpatient Primary MD for the patient is Greig Right, MD   Admit date - 05/31/2020   LOS - 51  Chief Complaint  Patient presents with  . Shortness of Breath  . Cough       Brief Narrative: Patient is a 59 y.o. female with PMHx of HTN, asthma-who tested positive for COVID-19 on 9/5-admitted for worsening shortness of breath due to acute hypoxic respiratory failure requiring HFNC secondary to COVID-19 pneumonia  COVID-19 vaccinated status: Unvaccinated  Significant Events: 9/5>> Admit to [facility] for hypoxia due to COVID-19  Significant studies: 9/5>>Chest x-ray: Patchy bilateral infiltrates 9/12>>> chest x-ray: Stable pneumonia throughout left lung and right midlung 9/14>> chest x-ray: Improving aeration with continued mild interstitial and alveolar opacities-left greater than right 9/15>> CTA chest: No PE, mosaic attenuation/groundglass opacities with more consolidative bibasilar findings as well as tubular bronchiectasis.  Soft tissue densities in the bilateral breasts. 9/15>> bilateral lower extremity Doppler: No DVT.  COVID-19 medications: Steroids:9/5>> Remdesivir:9/5>>9/9 Baricitinib:9/5>>9/18  Antibiotics: None  Microbiology data: 9/5>>Blood culture:no growth  Procedures: None  Consults: None  DVT prophylaxis: Changed back to daily dosing of Lovenox given development of epistaxis on 9/17.   Subjective:   Back on heated high flow and NRB.  Shortness of breath minimal activity.  No major other issues overnight.   Assessment  & Plan :   Acute Hypoxic Resp Failure due to Covid 19 Viral  pneumonia: Had some minimal improvement in hypoxemia over the past few days-she was switched to salter high flow and NRB yesterday-unfortunately hypoxia worsened and-and she is back on heated high flow and NRB.  Significant leukocytosis but she is on steroids-procalcitonin negative-however given severe hypoxemia-we will go ahead and cover empirically with IV cefepime.  MRSA PCR negative-suspect we do not need vancomycin.  Transition from Solu-Medrol to prednisone.  Volume status is much better-does not require IV Lasix today.  Continue to monitor very closely-she is DO NOT INTUBATE-but otherwise continue full scope of treatment.   Fever: afebrile O2  requirements:  SpO2: (!) 88 % O2 Flow Rate (L/min): 25 L/min (NRB 15L) FiO2 (%): 100 %   COVID-19 Labs: Recent Labs    06/08/20 0500 06/09/20 0248 06/10/20 0251  DDIMER 1.79* 1.82* 1.46*  FERRITIN 788*  --   --   CRP 4.6* 3.2* 1.6*       Component Value Date/Time   BNP 37.8 06/04/2020 0515    Recent Labs  Lab 06/04/20 0515 06/07/20 0731 06/10/20 0251  PROCALCITON <0.10 <0.10 <0.10    Lab Results  Component Value Date   SARSCOV2NAA POSITIVE (A) 06/04/2020     Prone/Incentive Spirometry: encouraged patient to lie prone for 3-4 hours at a time for a total of 16 hours a day, and to encourage incentive spirometry use 3-4/hour.  Epistaxis: Occurred on 9/17-has resolved.  DM-2 (A1c 6.9 on 9/6) with steroid-induced hyperglycemia: CBGs stable-continue Lantus 8 units twice daily, 5 units of NovoLog with meals and SSI.  May need adjustment of insulin regimen-now that steroid dosing continues to decrease.   Recent Labs    06/09/20 2119 06/10/20 0735 06/10/20 1214  GLUCAP 105* 128* 118*    HTN: BP soft-no longer on Cardizem.  Bronchial asthma: No wheezing-continue inhaler regimen.  Morbid Obesity: Estimated body mass index is 38.41 kg/m as calculated from the following:   Height as of 05/24/20: 4' 11.9" (1.521 m).   Weight as  of this encounter: 88.9 kg.    GI prophylaxis: PPI  ABG: No results found for: PHART, PCO2ART, PO2ART, HCO3, TCO2, ACIDBASEDEF, O2SAT  Vent Settings: N/A  Condition - Extremely Guarded  Family Communication  :  Spouse Cyrena Kuchenbecker [phone])  updated over the phone 9/19  Code Status : DO NOT INTUBATE.    Diet :  Diet Order            Diet Carb Modified Fluid consistency: Thin; Room service appropriate? Yes  Diet effective now                  Disposition Plan  :   Status is: Inpatient  Remains inpatient appropriate because:Inpatient level of care appropriate due to severity of illness  Dispo: The patient is from: Home              Anticipated d/c is to: Home              Anticipated d/c date is: > 3 days              Patient currently is not medically stable to d/c.   Barriers to discharge: Hypoxia requiring O2 supplementation/complete 5 days of IV Remdesivir  Antimicorbials  :    Anti-infectives (From admission, onward)   Start     Dose/Rate Route Frequency Ordered Stop   06/10/20 1400  ceFEPIme (MAXIPIME) 2 g in sodium chloride 0.9 % 100 mL IVPB        2 g 200 mL/hr over 30 Minutes Intravenous Every 8 hours 06/10/20 1017 06/15/20 1359   05/28/20 1000  remdesivir 100 mg in sodium chloride 0.9 % 100 mL IVPB       "Followed by" Linked Group Details   100 mg 200 mL/hr over 30 Minutes Intravenous Every 24 hours 05/28/2020 2038 05/31/20 0920   05/25/2020 2100  remdesivir 200 mg in sodium chloride 0.9% 250 mL IVPB       "Followed by" Linked Group Details   200 mg 580 mL/hr over 30 Minutes Intravenous Once 06/10/2020 2038 05/26/2020 2225  Inpatient Medications  Scheduled Meds: . vitamin C  500 mg Oral Daily  . buPROPion  150 mg Oral q AM  . enoxaparin (LOVENOX) injection  40 mg Subcutaneous Q24H  . insulin aspart  0-15 Units Subcutaneous TID WC  . insulin aspart  0-5 Units Subcutaneous QHS  . insulin aspart  5 Units Subcutaneous TID WC  . insulin glargine  8  Units Subcutaneous BID  . Ipratropium-Albuterol  1 puff Inhalation Q6H  . linagliptin  5 mg Oral Daily  . methylPREDNISolone (SOLU-MEDROL) injection  40 mg Intravenous Q12H  . mometasone-formoterol  2 puff Inhalation BID  . montelukast  10 mg Oral QHS  . oxymetazoline  3 spray Each Nare TID  . pantoprazole  40 mg Oral Daily  . pramipexole  1 mg Oral QHS  . senna-docusate  2 tablet Oral BID  . zinc sulfate  220 mg Oral Daily   Continuous Infusions: . ceFEPime (MAXIPIME) IV     PRN Meds:.acetaminophen, albuterol, benzonatate, guaiFENesin-dextromethorphan, ondansetron **OR** ondansetron (ZOFRAN) IV, polyethylene glycol, senna-docusate, sodium chloride   Time Spent in minutes  35   See all Orders from today for further details   Oren Binet M.D on 06/10/2020 at 1:07 PM  To page go to [email or web address] - use universal password  [facility] -  Office  [phone]    Objective:   Vitals:   06/10/20 0752 06/10/20 0836 06/10/20 1203 06/10/20 1221  BP: 132/85  105/64 103/71  Pulse: 88  (!) 106 (!) 106  Resp: 20  (!) 25 (!) 28  Temp: 97.6 F (36.4 C)  97.8 F (36.6 C) 97.8 F (36.6 C)  TempSrc: Axillary  Axillary Axillary  SpO2: 90% (!) 87% 90% (!) 88%  Weight:        Wt Readings from Last 3 Encounters:  06/08/20 88.9 kg  05/24/20 89.3 kg  12/01/19 91.6 kg     Intake/Output Summary (Last 24 hours) at 06/10/2020 1307 Last data filed at 06/10/2020 0900 Gross per 24 hour  Intake 720 ml  Output --  Net 720 ml     Physical Exam Gen Exam:Alert awake-not in any distress HEENT:atraumatic, normocephalic Chest: B/L clear to auscultation anteriorly CVS:S1S2 regular Abdomen:soft non tender, non distended Extremities:no edema Neurology: Non focal Skin: no rash  Data Review:    CBC Recent Labs  Lab 06/06/20 0750 06/07/20 0638 06/08/20 0500 06/09/20 0248 06/10/20 0251  WBC 14.3* 15.5* 14.2* 18.8* 19.1*  HGB 14.2 13.4 13.3 13.8 14.0  HCT 43.3 40.9 40.8  42.3 42.5  PLT 394 405* 392 442* 453*  MCV 90.4 88.7 89.3 89.8 89.1  MCH 29.6 29.1 29.1 29.3 29.4  MCHC 32.8 32.8 32.6 32.6 32.9  RDW 12.5 12.8 12.6 12.5 12.5    Chemistries  Recent Labs  Lab 06/06/20 0750 06/07/20 0638 06/08/20 0500 06/09/20 0248 06/10/20 0251  NA 137 138 137 138 137  K 4.4 4.3 4.5 5.2* 4.3  CL 100 102 100 99 98  CO2 28 26 27 28 23   GLUCOSE 200* 116* 157* 141* 210*  BUN 25* 18 22* 28* 39*  CREATININE 0.99 0.78 0.86 0.90 1.00  CALCIUM 8.8* 8.8* 8.7* 8.9 8.9  AST 21 23 23 31 22   ALT 23 22 20 17 24   ALKPHOS 75 74 74 78 81  BILITOT 0.6 0.8 0.8 1.8* 1.0   ------------------------------------------------------------------------------------------------------------------ No results for input(s): CHOL, HDL, LDLCALC, TRIG, CHOLHDL, LDLDIRECT in the last 72 hours.  Lab Results  Component Value Date   HGBA1C  6.9 (H) 05/28/2020   ------------------------------------------------------------------------------------------------------------------ No results for input(s): TSH, T4TOTAL, T3FREE, THYROIDAB in the last 72 hours.  Invalid input(s): FREET3 ------------------------------------------------------------------------------------------------------------------ Recent Labs    06/08/20 0500  FERRITIN 788*    Coagulation profile No results for input(s): INR, PROTIME in the last 168 hours.  Recent Labs    06/09/20 0248 06/10/20 0251  DDIMER 1.82* 1.46*    Cardiac Enzymes No results for input(s): CKMB, TROPONINI, MYOGLOBIN in the last 168 hours.  Invalid input(s): CK ------------------------------------------------------------------------------------------------------------------    Component Value Date/Time   BNP 37.8 06/04/2020 0515    Micro Results No results found for this or any previous visit (from the past 240 hour(s)).  Radiology Reports CT ANGIO CHEST PE W OR WO CONTRAST  Result Date: 06/06/2020 CLINICAL DATA:  COVID-19, with hypoxia and  shortness of breath, positive D dimer, suspected pulmonary embolus. EXAM: CT ANGIOGRAPHY CHEST WITH CONTRAST TECHNIQUE: Multidetector CT imaging of the chest was performed using the standard protocol during bolus administration of intravenous contrast. Multiplanar CT image reconstructions and MIPs were obtained to evaluate the vascular anatomy. CONTRAST:  74mL OMNIPAQUE IOHEXOL 350 MG/ML SOLN COMPARISON:  Chest x-ray 06/05/2020. CT abdomen pelvis 10/21/2019 FINDINGS: Cardiovascular: Satisfactory opacification of the pulmonary arteries to the segmental level. The main pulmonary artery is normal in caliber. No evidence of pulmonary embolism. Normal heart size. No significant pericardial effusion. Small to moderate volume hiatal hernia. Mediastinum/Nodes: Prominent mediastinal lymph nodes. No enlarged mediastinal, hilar, or axillary lymph nodes. Thyroid gland, trachea, and esophagus demonstrate no significant findings. Lungs/Pleura: Slightly expiratory phase of respiration. Diffuse ground-glass airspace opacity with more consolidative opacity in bilateral lower lobes. Mosaic attenuation of the lung best appreciated in the right upper lung zone. Superimposed mild septal wall thickening as well as bilateral lower lobe mild tubular bronchiectasis. Upper Abdomen: No acute abnormality. Musculoskeletal: Nonspecific 7 mm soft tissue density within the right breast likely representing an intramammary lymph node (6:46). Nonspecific 1 cm soft tissue density within the left breast (6:36) in a patient with little glandular tissue. No acute or significant osseous findings. Review of the MIP images confirms the above findings. IMPRESSION: 1. No pulmonary embolus. 2. Mosaic attenuation and ground-glass airspace opacity with more consolidative bibasilar findings as well as tubular bronchiectasis is consistent with known COVID pneumonia with superimposed ARDS not excluded. In the setting of protracted COVID pneumonia, presence of  infectious or inflammatory fibrosis is possible. Consider long-term CT follow-up. 3. Other imaging findings of potential clinical significance: Couple of soft tissue densities within bilateral breast in a patient with minimal glandular tissue; correlate with prior mammography or if clinically indicated follow-up with non-emergent mammographic evaluation. Small to moderate volume hiatal hernia. Electronically Signed   By: Iven Finn M.D.   On: 06/06/2020 16:54   DG Chest Port 1 View  Result Date: 06/05/2020 CLINICAL DATA:  COVID, hypoxia, shortness of breath EXAM: PORTABLE CHEST 1 VIEW COMPARISON:  06/03/2020 FINDINGS: Improving aeration bilaterally. Mild residual interstitial prominence and airspace disease bilaterally, left greater than right. Low lung volumes. Heart is borderline in size. No effusions or pneumothorax. IMPRESSION: Improving aeration with continued mild interstitial and alveolar opacities, left greater than right. Electronically Signed   By: Rolm Baptise M.D.   On: 06/05/2020 08:43   DG Chest Port 1 View  Result Date: 06/03/2020 CLINICAL DATA:  Follow-up COVID-19 pneumonia. EXAM: PORTABLE CHEST 1 VIEW COMPARISON:  06/04/2020 and earlier. FINDINGS: Cardiac silhouette upper normal in size for AP portable technique. Patchy ground-glass opacities throughout the  LEFT lung and in the RIGHT mid lung and RIGHT lung base are unchanged since the examination 1 week ago. No new pulmonary parenchymal abnormalities. No visible pleural effusions. IMPRESSION: Stable pneumonia throughout the LEFT lung and in the RIGHT mid lung and RIGHT lung base since the examination 1 week ago. No new abnormalities. Electronically Signed   By: Evangeline Dakin M.D.   On: 06/03/2020 09:28   DG Chest Port 1 View  Result Date: 05/23/2020 CLINICAL DATA:  Cough, COVID, hypoxia EXAM: PORTABLE CHEST 1 VIEW COMPARISON:  11/07/2018 FINDINGS: Lung volumes are small. Bibasilar asymmetric moderate pulmonary infiltrates are  present in keeping with atypical infection and compatible with the given history of COVID-19 pneumonia. No pneumothorax or pleural effusion. Cardiac size within normal limits. Pulmonary vascularity is normal. No acute bone abnormality. IMPRESSION: Bibasilar asymmetric moderate pulmonary infiltrates compatible with the given history of COVID-19 pneumonia. Electronically Signed   By: Fidela Salisbury MD   On: 06/13/2020 20:23   VAS [place] LOWER EXTREMITY VENOUS (DVT)  Result Date: 06/06/2020  Lower Venous DVTStudy Indications: D-dimer.  Limitations: Patient intolerant of compressions mid-dist thigh. Comparison Study: No prior studies. Performing Technologist: Darlin Coco  Examination Guidelines: A complete evaluation includes B-mode imaging, spectral Doppler, color Doppler, and power Doppler as needed of all accessible portions of each vessel. Bilateral testing is considered an integral part of a complete examination. Limited examinations for reoccurring indications may be performed as noted. The reflux portion of the exam is performed with the patient in reverse Trendelenburg.  +---------+---------------+---------+-----------+----------+--------------+ RIGHT    CompressibilityPhasicitySpontaneityPropertiesThrombus Aging +---------+---------------+---------+-----------+----------+--------------+ CFV      Full           Yes      Yes                                 +---------+---------------+---------+-----------+----------+--------------+ SFJ      Full                                                        +---------+---------------+---------+-----------+----------+--------------+ FV Prox  Full                                                        +---------+---------------+---------+-----------+----------+--------------+ FV Mid   Full                                                        +---------+---------------+---------+-----------+----------+--------------+ FV DistalFull                                                         +---------+---------------+---------+-----------+----------+--------------+ PFV      Full                                                        +---------+---------------+---------+-----------+----------+--------------+  POP      Full           Yes      Yes                                 +---------+---------------+---------+-----------+----------+--------------+ PTV      Full                                                        +---------+---------------+---------+-----------+----------+--------------+ PERO     Full                                                        +---------+---------------+---------+-----------+----------+--------------+   +---------+---------------+---------+-----------+----------+--------------+ LEFT     CompressibilityPhasicitySpontaneityPropertiesThrombus Aging +---------+---------------+---------+-----------+----------+--------------+ CFV      Full           Yes      Yes                                 +---------+---------------+---------+-----------+----------+--------------+ SFJ      Full                                                        +---------+---------------+---------+-----------+----------+--------------+ FV Prox  Full                                                        +---------+---------------+---------+-----------+----------+--------------+ FV Mid   Full                                                        +---------+---------------+---------+-----------+----------+--------------+ FV Distal               Yes      Yes                                 +---------+---------------+---------+-----------+----------+--------------+ PFV      Full                                                        +---------+---------------+---------+-----------+----------+--------------+ POP      Full           Yes      Yes                                  +---------+---------------+---------+-----------+----------+--------------+  PTV      Full                                                        +---------+---------------+---------+-----------+----------+--------------+ PERO     Full                                                        +---------+---------------+---------+-----------+----------+--------------+     Summary: RIGHT: - There is no evidence of deep vein thrombosis in the lower extremity.  - No cystic structure found in the popliteal fossa.  LEFT: - There is no evidence of deep vein thrombosis in the lower extremity.  - No cystic structure found in the popliteal fossa.  *See table(s) above for measurements and observations. Electronically signed by Harold Barban MD on 06/06/2020 at 5:20:35 PM.    Final

## 2020-06-10 NOTE — Progress Notes (Signed)
°   06/10/20 0025  Vitals  Temp 97.8 F (36.6 C)  Temp Source Oral  BP (!) 96/47  MAP (mmHg) (!) 62  BP Location Right Arm  BP Method Automatic  Patient Position (if appropriate) Lying  Pulse Rate 82  Pulse Rate Source Monitor  ECG Heart Rate 82  Resp (!) 21  Level of Consciousness  Level of Consciousness Alert  MEWS COLOR  MEWS Score Color Yellow  Oxygen Therapy  SpO2 94 %  O2 Device HFNC;Non-rebreather Mask  Heater temperature 89.6 F (32 C)  O2 Flow Rate (L/min) 25 L/min  FiO2 (%) 100 %  Patient Activity (if Appropriate) In bed  Pulse Oximetry Type Continuous  MEWS Score  MEWS Temp 0  MEWS Systolic 1  MEWS Pulse 0  MEWS RR 1  MEWS LOC 0  MEWS Score 2   V/S assessed; yellow MEWS continues from previous assessments.  Patient's respiration rate remains elevated.  Patient was ambulated to bedside commode, patient's O2 dropped to low 70s.  Patient increased to 35L HFNC and NRB, recovered after 5 minutes sitting on commode with deep breathing exercises and coaching. O2 recovered to 91%.  Respiratory therapy at patient's bedside.  Patient assisted back to bed in the prone position, patient sat on bed for 5 minutes with coaching to deep breathe.  O2 saturations remained stable during ambulation of patient.  Patient states that "anxiety" and panic is the reason why her O2 levels drop.  Patient assisted back into prone position, O2 decreased down to HFNC 25L and NRB.

## 2020-06-11 ENCOUNTER — Inpatient Hospital Stay (HOSPITAL_COMMUNITY): Payer: 59

## 2020-06-11 LAB — COMPREHENSIVE METABOLIC PANEL
ALT: 26 U/L (ref 0–44)
AST: 21 U/L (ref 15–41)
Albumin: 2.4 g/dL — ABNORMAL LOW (ref 3.5–5.0)
Alkaline Phosphatase: 77 U/L (ref 38–126)
Anion gap: 12 (ref 5–15)
BUN: 31 mg/dL — ABNORMAL HIGH (ref 6–20)
CO2: 25 mmol/L (ref 22–32)
Calcium: 9 mg/dL (ref 8.9–10.3)
Chloride: 100 mmol/L (ref 98–111)
Creatinine, Ser: 0.88 mg/dL (ref 0.44–1.00)
GFR calc Af Amer: >60 mL/min (ref >60)
GFR calc non Af Amer: >60 mL/min (ref >60)
Glucose, Bld: 207 mg/dL — ABNORMAL HIGH (ref 70–99)
Potassium: 4.2 mmol/L (ref 3.5–5.1)
Sodium: 137 mmol/L (ref 135–145)
Total Bilirubin: 0.9 mg/dL (ref 0.3–1.2)
Total Protein: 6.5 g/dL (ref 6.5–8.1)

## 2020-06-11 LAB — GLUCOSE, CAPILLARY
Glucose-Capillary: 223 mg/dL — ABNORMAL HIGH (ref 70–99)
Glucose-Capillary: 137 mg/dL — ABNORMAL HIGH (ref 70–99)
Glucose-Capillary: 271 mg/dL — ABNORMAL HIGH (ref 70–99)
Glucose-Capillary: 211 mg/dL — ABNORMAL HIGH (ref 70–99)
Glucose-Capillary: 172 mg/dL — ABNORMAL HIGH (ref 70–99)

## 2020-06-11 LAB — CBC
HCT: 42.8 % (ref 36.0–46.0)
Hemoglobin: 13.9 g/dL (ref 12.0–15.0)
MCH: 29 pg (ref 26.0–34.0)
MCHC: 32.5 g/dL (ref 30.0–36.0)
MCV: 89.4 fL (ref 80.0–100.0)
Platelets: 449 10*3/uL — ABNORMAL HIGH (ref 150–400)
RBC: 4.79 MIL/uL (ref 3.87–5.11)
RDW: 12.6 % (ref 11.5–15.5)
WBC: 15.2 10*3/uL — ABNORMAL HIGH (ref 4.0–10.5)
nRBC: 0 % (ref 0.0–0.2)

## 2020-06-11 LAB — C-REACTIVE PROTEIN: CRP: 6.1 mg/dL — ABNORMAL HIGH (ref <1.0)

## 2020-06-11 LAB — D-DIMER, QUANTITATIVE: D-Dimer, Quant: 1.55 ug/mL-FEU — ABNORMAL HIGH (ref 0.00–0.50)

## 2020-06-11 MED ORDER — ENSURE ENLIVE PO LIQD
237.0000 mL | Freq: Two times a day (BID) | ORAL | Status: DC
  Administered 2020-06-11 – 2020-06-14 (×4): 237 mL via ORAL

## 2020-06-11 MED ORDER — CHLORHEXIDINE GLUCONATE CLOTH 2 % EX PADS
6.0000 | MEDICATED_PAD | Freq: Every day | CUTANEOUS | Status: DC
  Administered 2020-06-11 – 2020-06-22 (×12): 6 via TOPICAL

## 2020-06-11 MED ORDER — METHYLPREDNISOLONE SODIUM SUCC 125 MG IJ SOLR
60.0000 mg | Freq: Two times a day (BID) | INTRAMUSCULAR | Status: DC
  Administered 2020-06-11 – 2020-06-14 (×6): 60 mg via INTRAVENOUS
  Filled 2020-06-11 (×6): qty 2

## 2020-06-11 MED ORDER — IPRATROPIUM-ALBUTEROL 20-100 MCG/ACT IN AERS
1.0000 | INHALATION_SPRAY | Freq: Four times a day (QID) | RESPIRATORY_TRACT | Status: DC
  Administered 2020-06-11 – 2020-06-15 (×14): 1 via RESPIRATORY_TRACT
  Filled 2020-06-11: qty 4

## 2020-06-11 NOTE — Progress Notes (Signed)
Physical Therapy Treatment Patient Details Name: Claudia Vega MRN: [id number] DOB: 09/15/1961 Today's Date: 06/11/2020    History of Present Illness Pt is a 59 y.o. female presenting 06/09/2020 with shortness of breath. Tested COVID +. Workup for acute hypoxic respiratory failure secondary to COVID-19 viral PNA. CT angio 9/15 negative for PE, ultrasound negative for DVTs. PMH including HTN, asthma.    PT Comments    Pt now in ICU, anxious, sating in low to mid 80s at rest on 50L HHFNC FiO2 100%, 15 L NRB.  Resting HR 110s and RR 30s.  Pt did not feel she could sit EOB but was agreeable to 4 extremity exercises with bed in max chair position.  PT will continue to follow acutely for safe mobility progression   Follow Up Recommendations  Home health PT     Equipment Recommendations  Other (comment) (home O2)    Recommendations for Other Services       Precautions / Restrictions Precautions Precautions: Other (comment) Precaution Comments: 50L HHFNC at 100% FiO2 + 15L NRB    Mobility  Bed Mobility               General bed mobility comments: Pt declined EOB mobility, but agreeable to try high chair mode in bed.  Participated in Bil UE and LE exercises, but did not feel like she could pull forward with trunk off the bed. Resting sats were mid to low 80s, RR 30s, HR 110s.    Transfers                    Ambulation/Gait                 Stairs             Wheelchair Mobility    Modified Rankin (Stroke Patients Only)       Balance                                            Cognition Arousal/Alertness: Awake/alert Behavior During Therapy: Flat affect;Anxious Overall Cognitive Status: Within Functional Limits for tasks assessed                                 General Comments: Pt flat affect, reporting she was supposed to go back to school today.  Aware of day/date.      Exercises General Exercises - Upper  Extremity Shoulder Flexion: AAROM;Both;10 reps Elbow Flexion: AROM;Right;10 reps (right side only because L elbow had IV site) General Exercises - Lower Extremity Ankle Circles/Pumps: AROM;Both;10 reps Long Arc Quad: AROM;10 reps;Both Heel Slides: AAROM;Both;10 reps    General Comments        Pertinent Vitals/Pain      Home Living                      Prior Function            PT Goals (current goals can now be found in the care plan section) Acute Rehab PT Goals Patient Stated Goal: none stated Progress towards PT goals: Not progressing toward goals - comment (increased O2 requirement, increased anxiety)    Frequency    Min 3X/week      PT Plan Current plan remains appropriate    Co-evaluation  AM-PAC PT "6 Clicks" Mobility   Outcome Measure  Help needed turning from your back to your side while in a flat bed without using bedrails?: None Help needed moving from lying on your back to sitting on the side of a flat bed without using bedrails?: None Help needed moving to and from a bed to a chair (including a wheelchair)?: None Help needed standing up from a chair using your arms (e.g., wheelchair or bedside chair)?: None Help needed to walk in hospital room?: A Little Help needed climbing 3-5 steps with a railing? : A Little 6 Click Score: 22    End of Session Equipment Utilized During Treatment: Oxygen Activity Tolerance: Patient limited by fatigue;Other (comment) (limited by anxiety) Patient left: with call bell/phone within reach;in bed;Other (comment) (in high chair mode) Nurse Communication: Mobility status PT Visit Diagnosis: Other abnormalities of gait and mobility (R26.89)     Time: 4431-5400 PT Time Calculation (min) (ACUTE ONLY): 23 min  Charges:  $Therapeutic Exercise: 23-37 mins                    Verdene Lennert, PT, DPT  Acute Rehabilitation [phone] pager [phone]) [phone] office

## 2020-06-11 NOTE — Progress Notes (Signed)
Assisted tele visits to patient with sister, husband, mother and father this am.  Maryelizabeth Rowan, RN

## 2020-06-11 NOTE — Progress Notes (Signed)
NAME:  Claudia Vega, MRN:  [id number], DOB:  Sep 07, 1961, LOS: 69 ADMISSION DATE:  06/14/2020, CONSULTATION DATE: September 19 REFERRING MD: Dr. Noah Delaine, CHIEF COMPLAINT: Dyspnea  Brief History   59 year old female admitted for Covid pneumonia on May 27, 2020 treated with remdesivir, Solu-Medrol, baricitinib developed worsening hypoxemia on June 10, 2020 so pulmonary and critical care medicine was consulted.  Has been on CPAP alternating with high flow nasal cannula/nonrebreather mask  Past Medical History  Asthma Migraine GERD  Significant Hospital Events   September 5 admission September 17 epistaxis while on full dose anticoagulation, adjusted back to prophylactic dosing September 19 transfer to ICU  Consults:  Pulmonary and critical care medicine  Procedures:    Significant Diagnostic Tests:  September 15 CT angiogram chest images independently reviewed showing no pulmonary embolism, diffuse bilateral airspace disease  Micro Data:  September 5 SARS-CoV-2 positive September 5 blood culture negative  Antimicrobials:  September 5 remdesivir> September 9 September 5 Solu-Medrol> tapered to prednisone current September 5 baricitinib> September 18  Interim history/subjective:    Objective   Blood pressure 127/77, pulse (!) 111, temperature 97.9 F (36.6 C), temperature source Axillary, resp. rate (!) 26, height 4\' 11"  (1.499 m), weight 88 kg, SpO2 (!) 79 %.    Vent Mode: PCV;BIPAP FiO2 (%):  [100 %] 100 % Set Rate:  [15 bmp] 15 bmp   Intake/Output Summary (Last 24 hours) at 06/11/2020 1201 Last data filed at 06/11/2020 1000 Gross per 24 hour  Intake 420.07 ml  Output --  Net 420.07 ml   Filed Weights   06/08/20 0900 06/11/20 0500 06/11/20 1052  Weight: 88.9 kg 88 kg 88 kg    Examination: General: Middle-aged female, on high flow nasal cannula/NRB mask HENT: NCAT OP clear PULM: Reduced air entry at the bases bilaterally, no crackles CV: RRR, no  mgr GI: BS+, soft, nontender MSK: normal bulk and tone Neuro: awake, alert, no distress, MAEW    Resolved Hospital Problem list   Epistaxis  Assessment & Plan:  Acute hypoxic respiratory failure due to ARDS from COVID-19 pneumonia Patient continued to remain hypoxic even with maximum high flow nasal cannula with nonrebreather mask but she is not tachypneic or tachycardic We will keep her on BiPAP/CPAP overnight and alternate with high flow nasal cannula Continue Solu-Medrol 60 mg twice daily Patient's cultures have been negative, repeat respiratory cultures are in process Started on empiric cefepime since yesterday  Asthma, not in exacerbation Albuterol as needed Dulera  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 Continue Lantus twice daily and NovoLog insulin as ordered   Best practice:  Diet: regular Pain/Anxiety/Delirium protocol (if indicated): n/a VAP protocol (if indicated): n/a DVT prophylaxis: lovenox GI prophylaxis: n/a Glucose control: as above Mobility: out of bed as tolerated Code Status: full Family Communication: Updated patient's family today Disposition: to ICU  Labs   CBC: Recent Labs  Lab 06/07/20 0638 06/08/20 0500 06/09/20 0248 06/10/20 0251 06/11/20 0419  WBC 15.5* 14.2* 18.8* 19.1* 15.2*  HGB 13.4 13.3 13.8 14.0 13.9  HCT 40.9 40.8 42.3 42.5 42.8  MCV 88.7 89.3 89.8 89.1 89.4  PLT 405* 392 442* 453* 449*    Basic Metabolic Panel: Recent Labs  Lab 06/07/20 0638 06/08/20 0500 06/09/20 0248 06/10/20 0251 06/11/20 0419  NA 138 137 138 137 137  K 4.3 4.5 5.2* 4.3 4.2  CL 102 100 99 98 100  CO2 26 27 28 23 25   GLUCOSE 116* 157* 141* 210* 207*  BUN 18 22* 28* 39* 31*  CREATININE 0.78 0.86 0.90 1.00 0.88  CALCIUM 8.8* 8.7* 8.9 8.9 9.0   GFR: Estimated Creatinine Clearance: 65.6 mL/min (by C-G formula based on SCr of 0.88 mg/dL). Recent Labs  Lab 06/07/20 0638 06/07/20 0731 06/08/20 0500 06/09/20 0248  06/10/20 0251 06/11/20 0419  PROCALCITON  --  <0.10  --   --  <0.10  --   WBC   < >  --  14.2* 18.8* 19.1* 15.2*   < > = values in this interval not displayed.    Liver Function Tests: Recent Labs  Lab 06/07/20 0638 06/08/20 0500 06/09/20 0248 06/10/20 0251 06/11/20 0419  AST 23 23 31 22 21   ALT 22 20 17 24 26   ALKPHOS 74 74 78 81 77  BILITOT 0.8 0.8 1.8* 1.0 0.9  PROT 6.1* 6.0* 6.3* 6.5 6.5  ALBUMIN 2.4* 2.3* 2.4* 2.6* 2.4*   No results for input(s): LIPASE, AMYLASE in the last 168 hours. No results for input(s): AMMONIA in the last 168 hours.  ABG No results found for: PHART, PCO2ART, PO2ART, HCO3, TCO2, ACIDBASEDEF, O2SAT   Coagulation Profile: No results for input(s): INR, PROTIME in the last 168 hours.  Cardiac Enzymes: No results for input(s): CKTOTAL, CKMB, CKMBINDEX, TROPONINI in the last 168 hours.  HbA1C: Hemoglobin A1C  Date/Time Value Ref Range Status  07/10/2012 06:44 PM 5.8  Final   Hgb A1c MFr Bld  Date/Time Value Ref Range Status  05/28/2020 04:00 AM 6.9 (H) 4.8 - 5.6 % Final    Comment:    (NOTE) Pre diabetes:          5.7%-6.4%  Diabetes:              >6.4%  Glycemic control for   <7.0% adults with diabetes     CBG: Recent Labs  Lab 06/10/20 0735 06/10/20 1214 06/10/20 1627 06/11/20 0808 06/11/20 1139  GLUCAP 128* 118* 116* 137* 271*    Review of Systems:   Gen: Denies fever, chills, weight change,+ fatigue, night sweats HEENT: Denies blurred vision, double vision, hearing loss, tinnitus, sinus congestion, rhinorrhea, sore throat, neck stiffness, dysphagia PULM: per HPI CV: Denies chest pain, edema, orthopnea, paroxysmal nocturnal dyspnea, palpitations GI: Denies abdominal pain, nausea, vomiting, diarrhea, hematochezia, melena, constipation, change in bowel habits GU: Denies dysuria, hematuria, polyuria, oliguria, urethral discharge Endocrine: Denies hot or cold intolerance, polyuria, polyphagia or appetite change Derm: Denies  rash, dry skin, scaling or peeling skin change Heme: Denies easy bruising, bleeding, bleeding gums Neuro: Denies headache, numbness, weakness, slurred speech, loss of memory or consciousness   Past Medical History  She,  has a past medical history of Acid reflux, Anxiety, Asthma, Migraine, Restless leg syndrome, Skin cancer, basal cell, and Tachycardia.   Surgical History    Past Surgical History:  Procedure Laterality Date  . ABDOMINAL HYSTERECTOMY    . TONSILLECTOMY       Social History   reports that she has never smoked. She has never used smokeless tobacco. She reports current alcohol use. She reports that she does not use drugs.   Family History   Her family history includes Hypertension in her maternal grandmother and mother. There is no history of CAD.   Allergies Allergies  Allergen Reactions  . Latex Rash and Anaphylaxis  . Other     BEE STINGS  . Avelox [Moxifloxacin Hcl In Nacl] Itching and Rash     Home Medications  Prior to Admission medications   Medication  Sig Start Date End Date Taking? Authorizing Provider  albuterol (PROAIR HFA) 108 (90 Base) MCG/ACT inhaler Inhale two puffs every four to six hours as needed for cough or wheeze. Patient taking differently: Inhale 2 puffs into the lungs every 4 (four) hours as needed for wheezing or shortness of breath.  01/14/18  Yes Kozlow, Donnamarie Poag, MD  beclomethasone (QVAR REDIHALER) 80 MCG/ACT inhaler Inhale two puffs one to two times daily during asthma flare-up as directed.  Rinse, gargle, and spit after use. Patient taking differently: Inhale 2 puffs into the lungs 2 (two) times daily. Rinse, gargle, and spit after use. 05/21/20  Yes Kozlow, Donnamarie Poag, MD  budesonide-formoterol (SYMBICORT) 160-4.5 MCG/ACT inhaler INHALE TWO PUFFS USING SPACER TWICE DAILY TO PREVENT COUGH OR WHEEZE. RINSE, GARGLE, AND SPIT AFTER USE. Patient taking differently: Inhale 2 puffs into the lungs in the morning and at bedtime. RINSE, GARGLE, AND  SPIT AFTER USE. 01/09/20  Yes Kozlow, Donnamarie Poag, MD  buPROPion (WELLBUTRIN XL) 150 MG 24 hr tablet Take 150 mg by mouth in the morning.  01/04/20  Yes [provider]  diltiazem (CARDIZEM CD) 120 MG 24 hr capsule Take 120 mg by mouth at bedtime.  08/12/16  Yes [provider]  EPINEPHrine 0.3 mg/0.3 mL IJ SOAJ injection Inject 0.3 mLs (0.3 mg total) into the muscle as needed for anaphylaxis. 01/05/20  Yes Kozlow, Donnamarie Poag, MD  fluticasone (FLONASE) 50 MCG/ACT nasal spray USE ONE TO TWO SPRAYS IN EACH NOSTRIL ONCE DAILY. Patient taking differently: Place 1-2 sprays into both nostrils daily.  11/23/19  Yes Kozlow, Donnamarie Poag, MD  hydrochlorothiazide (MICROZIDE) 12.5 MG capsule Take 12.5 mg by mouth daily.  01/11/19  Yes [provider]  ipratropium-albuterol (DUONEB) 0.5-2.5 (3) MG/3ML SOLN Take 3 mLs by nebulization every 4 (four) hours as needed. Patient taking differently: Take 3 mLs by nebulization every 4 (four) hours as needed (shortness of breath and wheezing).  11/07/18  Yes Malvin Johns, MD  montelukast (SINGULAIR) 10 MG tablet TAKE 1 TABLET BY MOUTH EVERYDAY AT BEDTIME 05/21/20  Yes Kozlow, Donnamarie Poag, MD  pantoprazole (PROTONIX) 40 MG tablet Take one tablet by mouth once daily.  Increase to one tablet twice daily during flare-up. Patient taking differently: Take 40 mg by mouth 2 (two) times daily.  11/25/18  Yes Kozlow, Donnamarie Poag, MD  pramipexole (MIRAPEX) 1 MG tablet Take 1 mg by mouth at bedtime.   Yes [provider]  tretinoin (RETIN-A) 0.05 % cream Apply 1 application topically at bedtime.  04/10/20  Yes [provider]  UBRELVY 100 MG TABS Take 100 mg by mouth daily as needed (stress induced migraine).  01/23/20  Yes [provider]     Total critical care time: 37 minutes  Performed by: [place] care time was exclusive of separately billable procedures and treating other patients.   Critical care was necessary to treat or prevent  imminent or life-threatening deterioration.   Critical care was time spent personally by me on the following activities: development of treatment plan with patient and/or surrogate as well as nursing, discussions with consultants, evaluation of patient's response to treatment, examination of patient, obtaining history from patient or surrogate, ordering and performing treatments and interventions, ordering and review of laboratory studies, ordering and review of radiographic studies, pulse oximetry and re-evaluation of patient's condition.   Jacky Kindle MD Critical care physician [facility]  Pager: [phone] Mobile: [phone]

## 2020-06-11 NOTE — Progress Notes (Signed)
Assisted tele visit to patient with family members.  Claudia Calderone [name], RN   

## 2020-06-11 NOTE — Progress Notes (Signed)
Pt taken off bipap at this time and placed on HHFNC and NRB. Pt denies SOB, no increased WOB. Pt with low spo2 but in no distress. MD and RN aware. Will continue to monitor closely for possible bipap or intubation needs.

## 2020-06-12 LAB — GLUCOSE, CAPILLARY
Glucose-Capillary: 175 mg/dL — ABNORMAL HIGH (ref 70–99)
Glucose-Capillary: 195 mg/dL — ABNORMAL HIGH (ref 70–99)
Glucose-Capillary: 157 mg/dL — ABNORMAL HIGH (ref 70–99)
Glucose-Capillary: 138 mg/dL — ABNORMAL HIGH (ref 70–99)

## 2020-06-12 LAB — BASIC METABOLIC PANEL
Anion gap: 15 (ref 5–15)
BUN: 30 mg/dL — ABNORMAL HIGH (ref 6–20)
CO2: 23 mmol/L (ref 22–32)
Calcium: 9.6 mg/dL (ref 8.9–10.3)
Chloride: 100 mmol/L (ref 98–111)
Creatinine, Ser: 0.87 mg/dL (ref 0.44–1.00)
GFR calc Af Amer: >60 mL/min (ref >60)
GFR calc non Af Amer: >60 mL/min (ref >60)
Glucose, Bld: 214 mg/dL — ABNORMAL HIGH (ref 70–99)
Potassium: 4.2 mmol/L (ref 3.5–5.1)
Sodium: 138 mmol/L (ref 135–145)

## 2020-06-12 LAB — CBC
HCT: 46.9 % — ABNORMAL HIGH (ref 36.0–46.0)
Hemoglobin: 15.1 g/dL — ABNORMAL HIGH (ref 12.0–15.0)
MCH: 29.2 pg (ref 26.0–34.0)
MCHC: 32.2 g/dL (ref 30.0–36.0)
MCV: 90.5 fL (ref 80.0–100.0)
Platelets: 443 10*3/uL — ABNORMAL HIGH (ref 150–400)
RBC: 5.18 MIL/uL — ABNORMAL HIGH (ref 3.87–5.11)
RDW: 12.2 % (ref 11.5–15.5)
WBC: 19.9 10*3/uL — ABNORMAL HIGH (ref 4.0–10.5)
nRBC: 0 % (ref 0.0–0.2)

## 2020-06-12 LAB — PHOSPHORUS: Phosphorus: 3.9 mg/dL (ref 2.5–4.6)

## 2020-06-12 LAB — MAGNESIUM: Magnesium: 2.4 mg/dL (ref 1.7–2.4)

## 2020-06-12 MED ORDER — LIP MEDEX EX OINT
TOPICAL_OINTMENT | CUTANEOUS | Status: DC | PRN
  Filled 2020-06-12: qty 7

## 2020-06-12 NOTE — Progress Notes (Signed)
Occupational Therapy Treatment Patient Details Name: Claudia Vega MRN: [id number] DOB: [birth year] Today's Date: 06/12/2020    History of present illness Pt is a 59 y.o. female presenting 06/05/2020 with shortness of breath. Tested COVID +. Workup for acute hypoxic respiratory failure secondary to COVID-19 viral PNA. CT angio 9/15 negative for PE, ultrasound negative for DVTs. PMH including HTN, asthma.Transfer to ICU 9/19.    OT comments  Pt seen on 50L 100% FiO2 and 15 L NRB with SpO2 80 at rest. Able to transition to EOB with mod A due to poor activity tolerance and anxiety. With use of relaxation techniques,encouragement and feedback, pt able to progress OOB to recliner with desat to 69. Able to rebound to 82 in @ 2 min with use of pursed lip breathing and relaxation techniques. Max HR 112. Pt complaining of B medial knee pain at rest however did not complain of pain in weight bearing position.  Pt set up with art activities to help manage anxiety. SpO2 85 at end of session. Pt issued level 1 theraband HEP to complete as she tolerates. Pt very appreciative. Will continue to follow.   Follow Up Recommendations  Home health OT;Supervision/Assistance - 24 hour    Equipment Recommendations  3 in 1 bedside commode    Recommendations for Other Services PT consult    Precautions / Restrictions Precautions Precaution Comments: 50L HHFNC at 100% FiO2 + 15L NRB       Mobility Bed Mobility Overal bed mobility: Needs Assistance Bed Mobility: Supine to Sit     Supine to sit: Mod assist     General bed mobility comments: Pt able to begin to move legs to edge of bed; bed pad used to help trnasition pt to EOB to conerve energy for transfer; Once EOB, pt became more anxious and started to say "I can't, my feet son't reach, I don't want to fall". Pt scooted to EOB with feet touching floor then quickly transferred to chair.   Transfers Overall transfer level: Needs assistance   Transfers: Sit  to/from Stand;Stand Pivot Transfers Sit to Stand: Min assist Stand pivot transfers: Min assist            Balance Overall balance assessment: Needs assistance   Sitting balance-Leahy Scale: Fair       Standing balance-Leahy Scale: Poor                             ADL either performed or assessed with clinical judgement   ADL Overall ADL's : Needs assistance/impaired Eating/Feeding: Set up   Grooming: Set up;Oral care                               Functional mobility during ADLs: Minimal assistance;Cueing for safety General ADL Comments: ADL significantly limited by activity tolerance; educated pt on pursed lip breathing and using relaxation techniques during mobility for ADL; pt holding shoulders elevated with hands fisted. Used feedback to increase awareness of posture to help with mobility     Vision       Perception     Praxis      Cognition Arousal/Alertness: Awake/alert Behavior During Therapy: Flat affect;Anxious Overall Cognitive Status: Impaired/Different from baseline                                 General  Comments: increased time for processing. will continue to assess        Exercises Exercises: Other exercises Other Exercises Other Exercises: pt issued writtent seated level theraband HEP with level 1 theraband. encouraged to complete Other Exercises: pursed lip breathing   Shoulder Instructions       General Comments See above. Once EOB, placed pt's hand on chair to give feedback and clear instructions/expectations; pt resonded well to this feedback    Pertinent Vitals/ Pain       Pain Assessment: Faces Faces Pain Scale: Hurts little more Pain Location: B medical knee pain Pain Descriptors / Indicators: Discomfort Pain Intervention(s): Limited activity within patient's tolerance  Home Living                                          Prior Functioning/Environment               Frequency  Min 2X/week        Progress Toward Goals  OT Goals(current goals can now be found in the care plan section)  Progress towards OT goals: Progressing toward goals  Acute Rehab OT Goals Patient Stated Goal: to get better OT Goal Formulation: With patient Time For Goal Achievement: 06/14/20 Potential to Achieve Goals: Good ADL Goals Pt Will Perform Grooming: with modified independence;standing;sitting Pt Will Perform Lower Body Dressing: with modified independence;sit to/from stand Pt Will Transfer to Toilet: with modified independence;ambulating;bedside commode Pt Will Perform Toileting - Clothing Manipulation and hygiene: with modified independence;sitting/lateral leans;sit to/from stand Additional ADL Goal #1: Pt will independently verbalize three energy conservation techniques for ADLs and IADLs Additional ADL Goal #2: Pt will independently monitor SpO2 and use purse lip breathing for ADLs  Plan Discharge plan remains appropriate    Co-evaluation                 AM-PAC OT "6 Clicks" Daily Activity     Outcome Measure   Help from another person eating meals?: A Little Help from another person taking care of personal grooming?: A Little Help from another person toileting, which includes using toliet, bedpan, or urinal?: A Lot Help from another person bathing (including washing, rinsing, drying)?: A Lot Help from another person to put on and taking off regular upper body clothing?: A Lot Help from another person to put on and taking off regular lower body clothing?: A Lot 6 Click Score: 14    End of Session Equipment Utilized During Treatment: Oxygen (50L 100% FiO2; 15 L NRB)  OT Visit Diagnosis: Unsteadiness on feet (R26.81);Other abnormalities of gait and mobility (R26.89);Muscle weakness (generalized) (M62.81);Pain Pain - Right/Left:  (B) Pain - part of body: Knee   Activity Tolerance Patient tolerated treatment well   Patient Left in chair;with  call bell/phone within reach   Nurse Communication Mobility status        Time: 1215-1300 OT Time Calculation (min): 45 min  Charges: OT General Charges $OT Visit: 1 Visit OT Treatments $Self Care/Home Management : 23-37 mins $Therapeutic Activity: 8-22 mins  Maurie Boettcher, OT/L   Acute OT Clinical Specialist [place] Pager [phone] Office [phone]    [facility] 06/12/2020, 1:26 PM

## 2020-06-12 NOTE — Progress Notes (Signed)
Inpatient Diabetes Program Recommendations  AACE/ADA: New Consensus Statement on Inpatient Glycemic Control (2015)  Target Ranges:  Prepandial:   less than 140 mg/dL      Peak postprandial:   less than 180 mg/dL (1-2 hours)      Critically ill patients:  140 - 180 mg/dL   Lab Results  Component Value Date   GLUCAP 195 (H) 06/12/2020   HGBA1C 6.9 (H) 05/28/2020    Review of Glycemic Control Results for MERCADES, BAJAJ (MRN [id number]) as of 06/12/2020 13:27  Ref. Range 06/11/2020 15:33 06/11/2020 20:58 06/12/2020 08:05 06/12/2020 11:56  Glucose-Capillary Latest Ref Range: 70 - 99 mg/dL 211 (H) 172 (H) 175 (H) 195 (H)   Diabetes history: New onset? Outpatient Diabetes medications: none noted Current orders for Inpatient glycemic control: Novolog 0-15 units TID & Novolog 0-5 units QHS, Novolog 5 units TID, Lantus 8 units BID, Tradjenta 5 mg QD Solumedrol 125 mg BID  Inpatient Diabetes Program Recommendations:    Consider increasing Lantus to 10 units BID.  Will follow up with patient regarding DM hx. When appropriate.  Thanks, Bronson Curb, MSN, RNC-OB Diabetes Coordinator [phone] (8a-5p)

## 2020-06-12 NOTE — Progress Notes (Signed)
Assisted tele visit to patient with husband. Also, attempting video call with patient's father.  Claudia Vega, Aliene Beams, RN

## 2020-06-12 NOTE — Progress Notes (Signed)
Pt currently on [place] not using bipap at this time. RT will monitor.

## 2020-06-12 NOTE — Progress Notes (Signed)
NAME:  Claudia Vega, MRN:  [id number], DOB:  [birth year], LOS: 92 ADMISSION DATE:  06/21/2020, CONSULTATION DATE: September 19 REFERRING MD: Dr. Noah Delaine, CHIEF COMPLAINT: Dyspnea  Brief History   59 year old female admitted for Covid pneumonia on May 27, 2020 treated with remdesivir, Solu-Medrol, baricitinib developed worsening hypoxemia on June 10, 2020 so pulmonary and critical care medicine was consulted.  Has been on CPAP alternating with high flow nasal cannula/nonrebreather mask  Past Medical History  Asthma Migraine GERD  Significant Hospital Events   September 5 admission September 17 epistaxis while on full dose anticoagulation, adjusted back to prophylactic dosing September 19 transfer to ICU  Consults:  Pulmonary and critical care medicine  Procedures:    Significant Diagnostic Tests:  September 15 CT angiogram chest images independently reviewed showing no pulmonary embolism, diffuse bilateral airspace disease  Micro Data:  September 5 SARS-CoV-2 positive September 5 blood culture negative  Antimicrobials:  September 5 remdesivir> September 9 September 5 Solu-Medrol> tapered to prednisone current September 5 baricitinib> September 18  Interim history/subjective:   Patient remained on high flow and nonrebreather, alternating with NIV at bedtime.  Her oxygen saturation fluctuates between 70s-90. Objective   Blood pressure 115/79, pulse (!) 109, temperature 98.5 F (36.9 C), temperature source Axillary, resp. rate (!) 32, height 4\' 11"  (1.499 m), weight 86.2 kg, SpO2 (!) 80 %.    Vent Mode: PCV;BIPAP FiO2 (%):  [100 %] 100 % Set Rate:  [15 bmp] 15 bmp PEEP:  [6 cmH20] 6 cmH20 Pressure Support:  [6 cmH20] 6 cmH20   Intake/Output Summary (Last 24 hours) at 06/12/2020 1300 Last data filed at 06/12/2020 1000 Gross per 24 hour  Intake 1135.37 ml  Output 700 ml  Net 435.37 ml   Filed Weights   06/11/20 0500 06/11/20 1052 06/12/20 0442  Weight:  88 kg 88 kg 86.2 kg    Examination: General: Middle-aged obese female, on high flow nasal cannula/NRB mask HENT: NCAT OP clear, mucosa is moist PULM: Reduced air entry at the bases bilaterally, no crackles CV: RRR, no mgr GI: BS+, soft, nontender MSK: normal bulk and tone Neuro: awake, alert, no distress, MAEW    Resolved Hospital Problem list   Epistaxis  Assessment & Plan:  Acute hypoxic respiratory failure due to ARDS from COVID-19 pneumonia Patient continued to remain hypoxic even with maximum high flow nasal cannula with nonrebreather mask She gets anxious intermittently We will keep her on BiPAP/CPAP overnight and alternate with high flow nasal cannula Continue Solu-Medrol 60 mg twice daily, will try to taper in the ER to Patient's cultures have been negative Repeat respiratory culture is pending Continue cefepime empirically  Asthma, not in exacerbation Albuterol as needed Dulera  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 Continue Lantus twice daily and NovoLog insulin as ordered   Best practice:  Diet: regular Pain/Anxiety/Delirium protocol (if indicated): n/a VAP protocol (if indicated): n/a DVT prophylaxis: lovenox GI prophylaxis: n/a Glucose control: as above Mobility: out of bed as tolerated Code Status: full Family Communication: Updated patient's family today Disposition: to ICU  Labs   CBC: Recent Labs  Lab 06/08/20 0500 06/09/20 0248 06/10/20 0251 06/11/20 0419 06/12/20 0230  WBC 14.2* 18.8* 19.1* 15.2* 19.9*  HGB 13.3 13.8 14.0 13.9 15.1*  HCT 40.8 42.3 42.5 42.8 46.9*  MCV 89.3 89.8 89.1 89.4 90.5  PLT 392 442* 453* 449* 443*    Basic Metabolic Panel: Recent Labs  Lab 06/08/20 0500 06/09/20 0248 06/10/20 0251 06/11/20 0419 06/12/20  0230  NA 137 138 137 137 138  K 4.5 5.2* 4.3 4.2 4.2  CL 100 99 98 100 100  CO2 27 28 23 25 23   GLUCOSE 157* 141* 210* 207* 214*  BUN 22* 28* 39* 31* 30*  CREATININE 0.86 0.90  1.00 0.88 0.87  CALCIUM 8.7* 8.9 8.9 9.0 9.6  MG  --   --   --   --  2.4  PHOS  --   --   --   --  3.9   GFR: Estimated Creatinine Clearance: 65.6 mL/min (by C-G formula based on SCr of 0.87 mg/dL). Recent Labs  Lab 06/07/20 0731 06/08/20 0500 06/09/20 0248 06/10/20 0251 06/11/20 0419 06/12/20 0230  PROCALCITON <0.10  --   --  <0.10  --   --   WBC  --    < > 18.8* 19.1* 15.2* 19.9*   < > = values in this interval not displayed.    Liver Function Tests: Recent Labs  Lab 06/07/20 0638 06/08/20 0500 06/09/20 0248 06/10/20 0251 06/11/20 0419  AST 23 23 31 22 21   ALT 22 20 17 24 26   ALKPHOS 74 74 78 81 77  BILITOT 0.8 0.8 1.8* 1.0 0.9  PROT 6.1* 6.0* 6.3* 6.5 6.5  ALBUMIN 2.4* 2.3* 2.4* 2.6* 2.4*   No results for input(s): LIPASE, AMYLASE in the last 168 hours. No results for input(s): AMMONIA in the last 168 hours.  ABG No results found for: PHART, PCO2ART, PO2ART, HCO3, TCO2, ACIDBASEDEF, O2SAT   Coagulation Profile: No results for input(s): INR, PROTIME in the last 168 hours.  Cardiac Enzymes: No results for input(s): CKTOTAL, CKMB, CKMBINDEX, TROPONINI in the last 168 hours.  HbA1C: Hemoglobin A1C  Date/Time Value Ref Range Status  07/10/2012 06:44 PM 5.8  Final   Hgb A1c MFr Bld  Date/Time Value Ref Range Status  05/28/2020 04:00 AM 6.9 (H) 4.8 - 5.6 % Final    Comment:    (NOTE) Pre diabetes:          5.7%-6.4%  Diabetes:              >6.4%  Glycemic control for   <7.0% adults with diabetes     CBG: Recent Labs  Lab 06/11/20 1139 06/11/20 1533 06/11/20 2058 06/12/20 0805 06/12/20 1156  GLUCAP 271* 211* 172* 175* 195*    Review of Systems:   Gen: Denies fever, chills, weight change,+ fatigue, night sweats HEENT: Denies blurred vision, double vision, hearing loss, tinnitus, sinus congestion, rhinorrhea, sore throat, neck stiffness, dysphagia PULM: per HPI CV: Denies chest pain, edema, orthopnea, paroxysmal nocturnal dyspnea,  palpitations GI: Denies abdominal pain, nausea, vomiting, diarrhea, hematochezia, melena, constipation, change in bowel habits GU: Denies dysuria, hematuria, polyuria, oliguria, urethral discharge Endocrine: Denies hot or cold intolerance, polyuria, polyphagia or appetite change Derm: Denies rash, dry skin, scaling or peeling skin change Heme: Denies easy bruising, bleeding, bleeding gums Neuro: Denies headache, numbness, weakness, slurred speech, loss of memory or consciousness   Past Medical History  She,  has a past medical history of Acid reflux, Anxiety, Asthma, Migraine, Restless leg syndrome, Skin cancer, basal cell, and Tachycardia.   Surgical History    Past Surgical History:  Procedure Laterality Date  . ABDOMINAL HYSTERECTOMY    . TONSILLECTOMY       Social History   reports that she has never smoked. She has never used smokeless tobacco. She reports current alcohol use. She reports that she does not use drugs.  Family History   Her family history includes Hypertension in her maternal grandmother and mother. There is no history of CAD.   Allergies Allergies  Allergen Reactions  . Latex Rash and Anaphylaxis  . Other     BEE STINGS  . Avelox [Moxifloxacin Hcl In Nacl] Itching and Rash     Home Medications  Prior to Admission medications   Medication Sig Start Date End Date Taking? Authorizing Provider  albuterol (PROAIR HFA) 108 (90 Base) MCG/ACT inhaler Inhale two puffs every four to six hours as needed for cough or wheeze. Patient taking differently: Inhale 2 puffs into the lungs every 4 (four) hours as needed for wheezing or shortness of breath.  01/14/18  Yes Kozlow, Donnamarie Poag, MD  beclomethasone (QVAR REDIHALER) 80 MCG/ACT inhaler Inhale two puffs one to two times daily during asthma flare-up as directed.  Rinse, gargle, and spit after use. Patient taking differently: Inhale 2 puffs into the lungs 2 (two) times daily. Rinse, gargle, and spit after use. 05/21/20  Yes  Kozlow, Donnamarie Poag, MD  budesonide-formoterol (SYMBICORT) 160-4.5 MCG/ACT inhaler INHALE TWO PUFFS USING SPACER TWICE DAILY TO PREVENT COUGH OR WHEEZE. RINSE, GARGLE, AND SPIT AFTER USE. Patient taking differently: Inhale 2 puffs into the lungs in the morning and at bedtime. RINSE, GARGLE, AND SPIT AFTER USE. 01/09/20  Yes Kozlow, Donnamarie Poag, MD  buPROPion (WELLBUTRIN XL) 150 MG 24 hr tablet Take 150 mg by mouth in the morning.  01/04/20  Yes [provider]  diltiazem (CARDIZEM CD) 120 MG 24 hr capsule Take 120 mg by mouth at bedtime.  08/12/16  Yes [provider]  EPINEPHrine 0.3 mg/0.3 mL IJ SOAJ injection Inject 0.3 mLs (0.3 mg total) into the muscle as needed for anaphylaxis. 01/05/20  Yes Kozlow, Donnamarie Poag, MD  fluticasone (FLONASE) 50 MCG/ACT nasal spray USE ONE TO TWO SPRAYS IN EACH NOSTRIL ONCE DAILY. Patient taking differently: Place 1-2 sprays into both nostrils daily.  11/23/19  Yes Kozlow, Donnamarie Poag, MD  hydrochlorothiazide (MICROZIDE) 12.5 MG capsule Take 12.5 mg by mouth daily.  01/11/19  Yes [provider]  ipratropium-albuterol (DUONEB) 0.5-2.5 (3) MG/3ML SOLN Take 3 mLs by nebulization every 4 (four) hours as needed. Patient taking differently: Take 3 mLs by nebulization every 4 (four) hours as needed (shortness of breath and wheezing).  11/07/18  Yes Malvin Johns, MD  montelukast (SINGULAIR) 10 MG tablet TAKE 1 TABLET BY MOUTH EVERYDAY AT BEDTIME 05/21/20  Yes Kozlow, Donnamarie Poag, MD  pantoprazole (PROTONIX) 40 MG tablet Take one tablet by mouth once daily.  Increase to one tablet twice daily during flare-up. Patient taking differently: Take 40 mg by mouth 2 (two) times daily.  11/25/18  Yes Kozlow, Donnamarie Poag, MD  pramipexole (MIRAPEX) 1 MG tablet Take 1 mg by mouth at bedtime.   Yes [provider]  tretinoin (RETIN-A) 0.05 % cream Apply 1 application topically at bedtime.  04/10/20  Yes [provider]  UBRELVY 100 MG TABS Take 100 mg by mouth daily as needed  (stress induced migraine).  01/23/20  Yes [provider]     Total critical care time: 35 minutes  Performed by: [place] care time was exclusive of separately billable procedures and treating other patients.   Critical care was necessary to treat or prevent imminent or life-threatening deterioration.   Critical care was time spent personally by me on the following activities: development of treatment plan with patient and/or surrogate as well as  nursing, discussions with consultants, evaluation of patient's response to treatment, examination of patient, obtaining history from patient or surrogate, ordering and performing treatments and interventions, ordering and review of laboratory studies, ordering and review of radiographic studies, pulse oximetry and re-evaluation of patient's condition.   Jacky Kindle MD Critical care physician [facility]  Pager: [phone] Mobile: [phone]

## 2020-06-13 LAB — POCT I-STAT 7, (LYTES, BLD GAS, ICA,H+H)
Acid-Base Excess: 2 mmol/L (ref 0.0–2.0)
Bicarbonate: 25.7 mmol/L (ref 20.0–28.0)
Calcium, Ion: 1.23 mmol/L (ref 1.15–1.40)
HCT: 38 % (ref 36.0–46.0)
Hemoglobin: 12.9 g/dL (ref 12.0–15.0)
O2 Saturation: 82 %
Potassium: 4.4 mmol/L (ref 3.5–5.1)
Sodium: 139 mmol/L (ref 135–145)
TCO2: 27 mmol/L (ref 22–32)
pCO2 arterial: 35.6 mmHg (ref 32.0–48.0)
pH, Arterial: 7.467 — ABNORMAL HIGH (ref 7.350–7.450)
pO2, Arterial: 43 mmHg — ABNORMAL LOW (ref 83.0–108.0)

## 2020-06-13 LAB — GLUCOSE, CAPILLARY
Glucose-Capillary: 125 mg/dL — ABNORMAL HIGH (ref 70–99)
Glucose-Capillary: 196 mg/dL — ABNORMAL HIGH (ref 70–99)
Glucose-Capillary: 180 mg/dL — ABNORMAL HIGH (ref 70–99)
Glucose-Capillary: 107 mg/dL — ABNORMAL HIGH (ref 70–99)

## 2020-06-13 MED ORDER — VITAL 1.5 CAL PO LIQD: 1000.0000 mL | ORAL | Status: DC

## 2020-06-13 NOTE — Progress Notes (Signed)
Assisted tele visit to patient with family member.  Alzada Brazee McEachran, RN  

## 2020-06-13 NOTE — Progress Notes (Signed)
Earlier today patient's O2 sat dropped to 43s, she was on high flow nasal cannula and nonrebreather mask, immediately she was placed on BiPAP with improvement in oxygenation up to high 80s.  After that patient is stated that she does not want to be intubated.  I spoke with the patient, asked again if she would like to be intubated and situation when she becomes hypoxemic and unable to make decision, she kept repeating "I would like to be kept alive no matter what" even it means she needs to be hooked up to the ventilator. We will continue to keep her as full code and proceed with intubation if she becomes persistently hypoxic.    Jacky Kindle MD Critical care physician [facility]  Pager: [phone] Mobile: [phone]

## 2020-06-13 NOTE — Progress Notes (Signed)
ABG results given to Dr. Tacy Learn. No new orders at this time. RT will continue to monitor.

## 2020-06-13 NOTE — Progress Notes (Signed)
Provider made aware of destat situation and that patient does not want to be intubated.

## 2020-06-13 NOTE — Progress Notes (Signed)
NAME:  Claudia Vega, MRN:  [id number], DOB:  1960/12/22, LOS: 55 ADMISSION DATE:  06/18/2020, CONSULTATION DATE: September 19 REFERRING MD: Dr. Noah Delaine, CHIEF COMPLAINT: Dyspnea  Brief History   59 year old female admitted for Covid pneumonia on May 27, 2020 treated with remdesivir, Solu-Medrol, baricitinib developed worsening hypoxemia on June 10, 2020 so pulmonary and critical care medicine was consulted.  Has been on CPAP alternating with high flow nasal cannula/nonrebreather mask  Past Medical History  Asthma Migraine GERD  Significant Hospital Events   September 5 admission September 17 epistaxis while on full dose anticoagulation, adjusted back to prophylactic dosing September 19 transfer to ICU  Consults:  Pulmonary and critical care medicine  Procedures:    Significant Diagnostic Tests:  September 15 CT angiogram chest images independently reviewed showing no pulmonary embolism, diffuse bilateral airspace disease  Micro Data:  September 5 SARS-CoV-2 positive September 5 blood culture negative  Antimicrobials:  September 5 remdesivir> September 9 September 5 Solu-Medrol> tapered to prednisone current September 5 baricitinib> September 18  Interim history/subjective:  Patient continued to get hypoxic easily with even minimal movement, her O2 sat remains in low 80s.  She is on high flow oxygen with a nonrebreather mask and alternate with noninvasive ventilation at night  Objective   Blood pressure 116/69, pulse (!) 118, temperature 99 F (37.2 C), temperature source Axillary, resp. rate (!) 24, height 4\' 11"  (1.499 m), weight 84.3 kg, SpO2 (!) 82 %.    Vent Mode: BIPAP FiO2 (%):  [100 %] 100 % Set Rate:  [15 bmp] 15 bmp PEEP:  [10 cmH20] 10 cmH20   Intake/Output Summary (Last 24 hours) at 06/13/2020 1305 Last data filed at 06/13/2020 1200 Gross per 24 hour  Intake 688.55 ml  Output 700 ml  Net -11.45 ml   Filed Weights   06/11/20 1052 06/12/20  0442 06/13/20 0500  Weight: 88 kg 86.2 kg 84.3 kg    Examination: General: Middle-aged obese female, on high flow nasal cannula/NRB mask HENT: NCAT OP clear, mucosa is moist PULM: Reduced air entry at the bases bilaterally, no crackles CV: RRR, no mgr GI: BS+, soft, nontender MSK: normal bulk and tone Neuro: awake, alert, no distress, MAEW Skin: No rash  Resolved Hospital Problem list   Epistaxis  Assessment & Plan:  Acute hypoxic respiratory failure due to ARDS from COVID-19 pneumonia Patient continued to remain hypoxic even with maximum high flow nasal cannula with nonrebreather mask ABGs were repeated which showed pH 7.46, PCO2 35 and PO2 of 43 and O2 sat 82 % only We will keep her on BiPAP/CPAP overnight and alternate with high flow nasal cannula Decrease Solu-Medrol to 50 mg twice daily Patient's cultures have been negative Continue cefepime empirically to complete 5 days treatment  Asthma, not in exacerbation Albuterol as needed Dulera  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 Continue Lantus twice daily and NovoLog insulin as ordered   Best practice:  Diet: regular Pain/Anxiety/Delirium protocol (if indicated): n/a VAP protocol (if indicated): n/a DVT prophylaxis: lovenox GI prophylaxis: n/a Glucose control: Lantus and SSI Mobility: out of bed as tolerated Code Status: full Family Communication: Patient's husband was updated over the phone Disposition: to ICU  Labs   CBC: Recent Labs  Lab 06/08/20 0500 06/08/20 0500 06/09/20 0248 06/10/20 0251 06/11/20 0419 06/12/20 0230 06/13/20 1144  WBC 14.2*  --  18.8* 19.1* 15.2* 19.9*  --   HGB 13.3   < > 13.8 14.0 13.9 15.1* 12.9  HCT 40.8   < >  42.3 42.5 42.8 46.9* 38.0  MCV 89.3  --  89.8 89.1 89.4 90.5  --   PLT 392  --  442* 453* 449* 443*  --    < > = values in this interval not displayed.    Basic Metabolic Panel: Recent Labs  Lab 06/08/20 0500 06/08/20 0500 06/09/20 0248  06/10/20 0251 06/11/20 0419 06/12/20 0230 06/13/20 1144  NA 137   < > 138 137 137 138 139  K 4.5   < > 5.2* 4.3 4.2 4.2 4.4  CL 100  --  99 98 100 100  --   CO2 27  --  28 23 25 23   --   GLUCOSE 157*  --  141* 210* 207* 214*  --   BUN 22*  --  28* 39* 31* 30*  --   CREATININE 0.86  --  0.90 1.00 0.88 0.87  --   CALCIUM 8.7*  --  8.9 8.9 9.0 9.6  --   MG  --   --   --   --   --  2.4  --   PHOS  --   --   --   --   --  3.9  --    < > = values in this interval not displayed.   GFR: Estimated Creatinine Clearance: 64.7 mL/min (by C-G formula based on SCr of 0.87 mg/dL). Recent Labs  Lab 06/07/20 0731 06/08/20 0500 06/09/20 0248 06/10/20 0251 06/11/20 0419 06/12/20 0230  PROCALCITON <0.10  --   --  <0.10  --   --   WBC  --    < > 18.8* 19.1* 15.2* 19.9*   < > = values in this interval not displayed.    Liver Function Tests: Recent Labs  Lab 06/07/20 0638 06/08/20 0500 06/09/20 0248 06/10/20 0251 06/11/20 0419  AST 23 23 31 22 21   ALT 22 20 17 24 26   ALKPHOS 74 74 78 81 77  BILITOT 0.8 0.8 1.8* 1.0 0.9  PROT 6.1* 6.0* 6.3* 6.5 6.5  ALBUMIN 2.4* 2.3* 2.4* 2.6* 2.4*   No results for input(s): LIPASE, AMYLASE in the last 168 hours. No results for input(s): AMMONIA in the last 168 hours.  ABG    Component Value Date/Time   PHART 7.467 (H) 06/13/2020 1144   PCO2ART 35.6 06/13/2020 1144   PO2ART 43 (L) 06/13/2020 1144   HCO3 25.7 06/13/2020 1144   TCO2 27 06/13/2020 1144   O2SAT 82.0 06/13/2020 1144     Coagulation Profile: No results for input(s): INR, PROTIME in the last 168 hours.  Cardiac Enzymes: No results for input(s): CKTOTAL, CKMB, CKMBINDEX, TROPONINI in the last 168 hours.  HbA1C: Hemoglobin A1C  Date/Time Value Ref Range Status  07/10/2012 06:44 PM 5.8  Final   Hgb A1c MFr Bld  Date/Time Value Ref Range Status  05/28/2020 04:00 AM 6.9 (H) 4.8 - 5.6 % Final    Comment:    (NOTE) Pre diabetes:          5.7%-6.4%  Diabetes:               >6.4%  Glycemic control for   <7.0% adults with diabetes     CBG: Recent Labs  Lab 06/12/20 1156 06/12/20 1629 06/12/20 2107 06/13/20 0822 06/13/20 1244  GLUCAP 195* 157* 138* 125* 196*    Review of Systems:   Gen: Denies fever, chills, weight change,+ fatigue, night sweats HEENT: Denies blurred vision, double vision, hearing loss, tinnitus, sinus congestion,  rhinorrhea, sore throat, neck stiffness, dysphagia PULM: Positive for shortness of breath, dry cough and chest congestion CV: Denies chest pain, edema, orthopnea, paroxysmal nocturnal dyspnea, palpitations GI: Denies abdominal pain, nausea, vomiting, diarrhea, hematochezia, melena, constipation, change in bowel habits GU: Denies dysuria, hematuria, polyuria, oliguria, urethral discharge Endocrine: Denies hot or cold intolerance, polyuria, polyphagia or appetite change Derm: Denies rash, dry skin, scaling or peeling skin change Heme: Denies easy bruising, bleeding, bleeding gums Neuro: Denies headache, numbness, weakness, slurred speech, loss of memory or consciousness   Past Medical History  She,  has a past medical history of Acid reflux, Anxiety, Asthma, Migraine, Restless leg syndrome, Skin cancer, basal cell, and Tachycardia.   Surgical History    Past Surgical History:  Procedure Laterality Date  . ABDOMINAL HYSTERECTOMY    . TONSILLECTOMY       Social History   reports that she has never smoked. She has never used smokeless tobacco. She reports current alcohol use. She reports that she does not use drugs.   Family History   Her family history includes Hypertension in her maternal grandmother and mother. There is no history of CAD.   Allergies Allergies  Allergen Reactions  . Latex Rash and Anaphylaxis  . Other     BEE STINGS  . Avelox [Moxifloxacin Hcl In Nacl] Itching and Rash     Home Medications  Prior to Admission medications   Medication Sig Start Date End Date Taking? Authorizing Provider    albuterol (PROAIR HFA) 108 (90 Base) MCG/ACT inhaler Inhale two puffs every four to six hours as needed for cough or wheeze. Patient taking differently: Inhale 2 puffs into the lungs every 4 (four) hours as needed for wheezing or shortness of breath.  01/14/18  Yes Kozlow, Donnamarie Poag, MD  beclomethasone (QVAR REDIHALER) 80 MCG/ACT inhaler Inhale two puffs one to two times daily during asthma flare-up as directed.  Rinse, gargle, and spit after use. Patient taking differently: Inhale 2 puffs into the lungs 2 (two) times daily. Rinse, gargle, and spit after use. 05/21/20  Yes Kozlow, Donnamarie Poag, MD  budesonide-formoterol (SYMBICORT) 160-4.5 MCG/ACT inhaler INHALE TWO PUFFS USING SPACER TWICE DAILY TO PREVENT COUGH OR WHEEZE. RINSE, GARGLE, AND SPIT AFTER USE. Patient taking differently: Inhale 2 puffs into the lungs in the morning and at bedtime. RINSE, GARGLE, AND SPIT AFTER USE. 01/09/20  Yes Kozlow, Donnamarie Poag, MD  buPROPion (WELLBUTRIN XL) 150 MG 24 hr tablet Take 150 mg by mouth in the morning.  01/04/20  Yes [provider]  diltiazem (CARDIZEM CD) 120 MG 24 hr capsule Take 120 mg by mouth at bedtime.  08/12/16  Yes [provider]  EPINEPHrine 0.3 mg/0.3 mL IJ SOAJ injection Inject 0.3 mLs (0.3 mg total) into the muscle as needed for anaphylaxis. 01/05/20  Yes Kozlow, Donnamarie Poag, MD  fluticasone (FLONASE) 50 MCG/ACT nasal spray USE ONE TO TWO SPRAYS IN EACH NOSTRIL ONCE DAILY. Patient taking differently: Place 1-2 sprays into both nostrils daily.  11/23/19  Yes Kozlow, Donnamarie Poag, MD  hydrochlorothiazide (MICROZIDE) 12.5 MG capsule Take 12.5 mg by mouth daily.  01/11/19  Yes [provider]  ipratropium-albuterol (DUONEB) 0.5-2.5 (3) MG/3ML SOLN Take 3 mLs by nebulization every 4 (four) hours as needed. Patient taking differently: Take 3 mLs by nebulization every 4 (four) hours as needed (shortness of breath and wheezing).  11/07/18  Yes Malvin Johns, MD  montelukast (SINGULAIR) 10 MG tablet  TAKE 1 TABLET BY MOUTH EVERYDAY AT BEDTIME 05/21/20  Yes Kozlow, Donnamarie Poag, MD  pantoprazole (PROTONIX) 40 MG tablet Take one tablet by mouth once daily.  Increase to one tablet twice daily during flare-up. Patient taking differently: Take 40 mg by mouth 2 (two) times daily.  11/25/18  Yes Kozlow, Donnamarie Poag, MD  pramipexole (MIRAPEX) 1 MG tablet Take 1 mg by mouth at bedtime.   Yes [provider]  tretinoin (RETIN-A) 0.05 % cream Apply 1 application topically at bedtime.  04/10/20  Yes [provider]  UBRELVY 100 MG TABS Take 100 mg by mouth daily as needed (stress induced migraine).  01/23/20  Yes [provider]     Total critical care time: 32 minutes  Performed by: [place] care time was exclusive of separately billable procedures and treating other patients.   Critical care was necessary to treat or prevent imminent or life-threatening deterioration.   Critical care was time spent personally by me on the following activities: development of treatment plan with patient and/or surrogate as well as nursing, discussions with consultants, evaluation of patient's response to treatment, examination of patient, obtaining history from patient or surrogate, ordering and performing treatments and interventions, ordering and review of laboratory studies, ordering and review of radiographic studies, pulse oximetry and re-evaluation of patient's condition.   Jacky Kindle MD Critical care physician [facility]  Pager: [phone] Mobile: [phone]

## 2020-06-13 NOTE — Progress Notes (Addendum)
Initial Nutrition Assessment  DOCUMENTATION CODES:   Not applicable  INTERVENTION:   Ensure Enlive po TID, each supplement provides 350 kcal and 20 grams of protein  Liberalize diet to REGULAR  Consider Tube Feeding via Cortrak tube when able, it pt agrees to Cortrak:  Vital 1.5 at 55 ml/hr Pro-Source 45 mL BID Provides 2060 kcals, 111 kcals and 1003 mL of free water  Add MVI with Minerals   NUTRITION DIAGNOSIS:   Increased nutrient needs related to catabolic illness as evidenced by estimated needs.  GOAL:   Patient will meet greater than or equal to 90% of their needs  MONITOR:   PO intake, Supplement acceptance, Labs, Weight trends  REASON FOR ASSESSMENT:   LOS    ASSESSMENT:   59 yo female admitted on 9/05 with COVID-19 pneumonia, devloped worsening respiratory failure due to ARDS requiring transfer to ICU on 9/19. PMH includes asthma, migraine, GERD, DM   9/05 Admitted 9/19 ICU transfer  Currently on HFNC and NRB; BiPap at night  PO intake with significant decline since 9/18; eating 0-20% of meals which correlates with worsening status and transfer to ICU. Pt only ate  10% at breakfast  Plan was for Cortrak tube yesterday after discussing with pt and MD; when Cortrak team came to place tube however, pt declined and was not ready.   Current wt 84.3 kg; admit weight 89 kg.   Labs: CBGs 138-196 (ICU goal 140-180) Meds: ss novolog, novolog with meals, lantus, tradjenta, solumedrol, zinc sulfate, Vit C   NUTRITION - FOCUSED PHYSICAL EXAM:    Most Recent Value  Orbital Region No depletion  Upper Arm Region No depletion  Thoracic and Lumbar Region No depletion  Buccal Region No depletion  Temple Region No depletion  Clavicle Bone Region No depletion  Clavicle and Acromion Bone Region No depletion  Scapular Bone Region No depletion  Dorsal Hand No depletion  Patellar Region No depletion  Anterior Thigh Region No depletion  Posterior Calf Region No  depletion  Edema (RD Assessment) None  Hair Reviewed  Eyes Reviewed  Mouth Reviewed  Skin Reviewed  Nails Reviewed       Diet Order:  CARB MODIFIED  EDUCATION NEEDS:   Not appropriate for education at this time  Skin:  Skin Assessment: Reviewed RN Assessment  Last BM:  9/18  Height:   Ht Readings from Last 1 Encounters:  06/11/20 4\' 11"  (1.499 m)    Weight:   Wt Readings from Last 1 Encounters:  06/14/20 81.5 kg     BMI:  Body mass index is 36.29 kg/m.  Estimated Nutritional Needs:   Kcal:  1900-2100 kcals  Protein:  100-120 g  Fluid:  >/= 2 L    Kerman Passey MS, RDN, LDN, CNSC Registered Dietitian III Clinical Nutrition RD Pager and On-Call Pager Number Located in [place]

## 2020-06-13 NOTE — Progress Notes (Signed)
PT Cancellation Note  Patient Details Name: Claudia Vega MRN: [id number] DOB: May 21, 1961   Cancelled Treatment:    Reason Eval/Treat Not Completed: Medical issues which prohibited therapy.  Per RN, hold today.  Will see as able 9/23. 06/13/2020  Ginger Carne., PT Acute Rehabilitation Services [phone]  (pager) [phone]  (office)   Tessie Fass Jeovani Weisenburger 06/13/2020, 4:58 PM

## 2020-06-13 NOTE — Progress Notes (Signed)
Assisted tele visit to patient with husband.  Claudia Schreur McEachran, RN  

## 2020-06-13 NOTE — Progress Notes (Signed)
CORTRAK NOTE  Consult for Cortrak tube placement received. Patient is on Carb Modified diet but is currently on max HFNC and NRB mask.   RN talked with patient prior to Cortrak team entering the room. Patient refusing tube placement stating that she has not had adequate time to think about this and that she feels she is losing her independence.   RN and RD able to talk with Dr. Tacy Learn. Plan to try again for tube placement on Friday (Cortrak service is available on Mondays, Wednesdays, and Fridays).  Will cancel Cortrak order at this time.      Jarome Matin, MS, RD, LDN, CNSC Inpatient Clinical Dietitian RD pager # available in [place]  After hours/weekend pager # available in [facility]

## 2020-06-13 NOTE — Progress Notes (Signed)
Pt spo2 dropped to 45%, pt placed back on bipap despite saying she did not want to go on it. RT explained she would have to go on a ventilator and pt stated she did not want to be intubated. RN made aware.

## 2020-06-14 LAB — GLUCOSE, CAPILLARY
Glucose-Capillary: 109 mg/dL — ABNORMAL HIGH (ref 70–99)
Glucose-Capillary: 234 mg/dL — ABNORMAL HIGH (ref 70–99)
Glucose-Capillary: 123 mg/dL — ABNORMAL HIGH (ref 70–99)
Glucose-Capillary: 124 mg/dL — ABNORMAL HIGH (ref 70–99)
Glucose-Capillary: 141 mg/dL — ABNORMAL HIGH (ref 70–99)

## 2020-06-14 MED ORDER — DIPHENHYDRAMINE HCL 12.5 MG/5ML PO ELIX
12.5000 mg | ORAL_SOLUTION | Freq: Every evening | ORAL | Status: DC | PRN
  Administered 2020-06-14: 12.5 mg via ORAL
  Filled 2020-06-14: qty 5

## 2020-06-14 MED ORDER — OXYMETAZOLINE HCL 0.05 % NA SOLN
2.0000 | Freq: Three times a day (TID) | NASAL | Status: DC
  Administered 2020-06-14 – 2020-06-15 (×3): 2 via NASAL
  Filled 2020-06-14: qty 30

## 2020-06-14 MED ORDER — ENSURE ENLIVE PO LIQD
237.0000 mL | Freq: Three times a day (TID) | ORAL | Status: DC
  Administered 2020-06-14: 237 mL via ORAL

## 2020-06-14 MED ORDER — FUROSEMIDE 10 MG/ML IJ SOLN
40.0000 mg | Freq: Once | INTRAMUSCULAR | Status: AC
  Administered 2020-06-14: 40 mg via INTRAVENOUS
  Filled 2020-06-14: qty 4

## 2020-06-14 MED ORDER — FAMOTIDINE IN NACL 20-0.9 MG/50ML-% IV SOLN
20.0000 mg | Freq: Two times a day (BID) | INTRAVENOUS | Status: DC
  Administered 2020-06-14 – 2020-06-17 (×6): 20 mg via INTRAVENOUS
  Filled 2020-06-14 (×6): qty 50

## 2020-06-14 MED ORDER — POLYETHYLENE GLYCOL 3350 17 G PO PACK
17.0000 g | PACK | Freq: Every day | ORAL | Status: DC
  Administered 2020-06-14 – 2020-06-15 (×2): 17 g via ORAL
  Filled 2020-06-14 (×2): qty 1

## 2020-06-14 MED ORDER — METHYLPREDNISOLONE SODIUM SUCC 125 MG IJ SOLR
50.0000 mg | Freq: Two times a day (BID) | INTRAMUSCULAR | Status: DC
  Administered 2020-06-14 – 2020-06-21 (×14): 50 mg via INTRAVENOUS
  Filled 2020-06-14 (×13): qty 2

## 2020-06-14 MED ORDER — ADULT MULTIVITAMIN W/MINERALS CH
1.0000 | ORAL_TABLET | Freq: Every day | ORAL | Status: DC
  Administered 2020-06-14 – 2020-06-15 (×2): 1 via ORAL
  Filled 2020-06-14 (×2): qty 1

## 2020-06-14 MED ORDER — RINGERS IV SOLN: INTRAVENOUS | Status: DC

## 2020-06-14 MED ORDER — LACTATED RINGERS IV SOLN: INTRAVENOUS | Status: AC

## 2020-06-14 NOTE — Progress Notes (Signed)
Occupational Therapy Treatment Patient Details Name: Claudia Vega MRN: [id number] DOB: [birth year] Today's Date: 06/14/2020    History of present illness Pt is a 59 y.o. female presenting 06/05/2020 with shortness of breath. Tested COVID +. Workup for acute hypoxic respiratory failure secondary to COVID-19 viral PNA. CT angio 9/15 negative for PE, ultrasound negative for DVTs. PMH including HTN, asthma.Transfer to ICU 9/19.    OT comments  Pt seen for OT follow up for progression of ADLs and energy conservation training. Pt on 50L HHFNC +15L NRB to start session. She was able to engage in LBD of mesh panties by bridging in bed with mod A. She then completed bed mobility with mod A. Once EOB, pt becoming anxious and requiring mod VC's for slow, deep breathing. Sats in low 70s after spell of coughing with ~19mins to recover. Pt then completed stand pivot transfer with min A. She demonstrated the ability to statically stand without UE support at this time. RN increased O2 to 70L HHFNC+15L NRB for transfer with sats in mid to low 80s. Once pt in chair she required >5 mins to recover with max VC's for relaxation and deep breathing. D/c recs remain appropriate, will continue to follow.  Vitals: Pt required 70L of O2 via HHFNC and 15L NRB to maintain SpO2 >83% with stand pivot transfer. RR was up to 40, recovered to mid 12s.   Follow Up Recommendations  Home health OT;Supervision/Assistance - 24 hour    Equipment Recommendations  3 in 1 bedside commode    Recommendations for Other Services      Precautions / Restrictions Precautions Precautions: Other (comment) Precaution Comments: 50-70L HHFNC at 100% FiO2 + 15L NRB Restrictions Weight Bearing Restrictions: No       Mobility Bed Mobility Overal bed mobility: Needs Assistance Bed Mobility: Supine to Sit     Supine to sit: Mod assist     General bed mobility comments: assist to guide BLEs to EOB and support at trunk to help conserve  energy. Once EOB pt with increased anxiety  Transfers Overall transfer level: Needs assistance Equipment used: None Transfers: Sit to/from [facility] Sit to Stand: Min assist Stand pivot transfers: Min assist       General transfer comment: assist for steadying and lines management; able to stand statically in front of chair without assist. Mostly limited by anxiety    Balance Overall balance assessment: Needs assistance Sitting-balance support: Bilateral upper extremity supported;Feet supported Sitting balance-Leahy Scale: Fair     Standing balance support: Bilateral upper extremity supported;No upper extremity supported;During functional activity Standing balance-Leahy Scale: Fair Standing balance comment: able to static stand without UE support                           ADL either performed or assessed with clinical judgement   ADL Overall ADL's : Needs assistance/impaired                     Lower Body Dressing: Moderate assistance;Bed level Lower Body Dressing Details (indicate cue type and reason): to don mesh panties over purewick. Pt performing bridging and requiring mod A to pull up over hips Toilet Transfer: Min Statistician Details (indicate cue type and reason): close guarding for safety with simulated transfer to recliner.           General ADL Comments: ADL continues to be significantly limited by activity tolerance and high O2 demands. Pt  able to recall deep slow breathing techniques learned in previous OT session, but continues to require cues for implementation.     Vision       Perception     Praxis      Cognition Arousal/Alertness: Awake/alert Behavior During Therapy: Anxious Overall Cognitive Status: Impaired/Different from baseline                                 General Comments: anxious with mobility and requiring increased cues to implement deep breathing and  relaxation. Increased time needed to process, but full cognition difficult to assess given curent status, Will continue to monitor        Exercises     Shoulder Instructions       General Comments      Pertinent Vitals/ Pain       Pain Assessment: No/denies pain  Home Living                                          Prior Functioning/Environment              Frequency  Min 2X/week        Progress Toward Goals  OT Goals(current goals can now be found in the care plan section)  Progress towards OT goals: Progressing toward goals  Acute Rehab OT Goals Patient Stated Goal: to get better OT Goal Formulation: With patient Time For Goal Achievement: 06/28/20 Potential to Achieve Goals: Good  Plan Discharge plan remains appropriate    Co-evaluation                 AM-PAC OT "6 Clicks" Daily Activity     Outcome Measure   Help from another person eating meals?: A Little Help from another person taking care of personal grooming?: A Little Help from another person toileting, which includes using toliet, bedpan, or urinal?: A Lot Help from another person bathing (including washing, rinsing, drying)?: A Lot Help from another person to put on and taking off regular upper body clothing?: A Lot Help from another person to put on and taking off regular lower body clothing?: A Lot 6 Click Score: 14    End of Session Equipment Utilized During Treatment: Oxygen  OT Visit Diagnosis: Unsteadiness on feet (R26.81);Other abnormalities of gait and mobility (R26.89);Muscle weakness (generalized) (M62.81)   Activity Tolerance Patient tolerated treatment well   Patient Left in chair;with call bell/phone within reach   Nurse Communication Mobility status        Time: 9628-3662 OT Time Calculation (min): 31 min  Charges: OT General Charges $OT Visit: 1 Visit OT Treatments $Self Care/Home Management : 8-22 mins  Zenovia Jarred, MSOT,  OTR/L [place] [facility] Office Number: [phone] Pager: [phone]  Zenovia Jarred 06/14/2020, 5:44 PM

## 2020-06-14 NOTE — Progress Notes (Signed)
Assisted tele visit to patient with husband, and father.  Vail Basista, Canary Brim, RN

## 2020-06-14 NOTE — Progress Notes (Signed)
Assisted tele visit with family member

## 2020-06-14 NOTE — Progress Notes (Signed)
NAME:  Claudia Vega, MRN:  [id number], DOB:  [birth year], LOS: 76 ADMISSION DATE:  06/17/2020, CONSULTATION DATE: September 19 REFERRING MD: Dr. Noah Delaine, CHIEF COMPLAINT: Dyspnea  Brief History   59 year old female admitted for Covid pneumonia on May 27, 2020 treated with remdesivir, Solu-Medrol, baricitinib developed worsening hypoxemia on June 10, 2020 so pulmonary and critical care medicine was consulted.  Has been on CPAP alternating with high flow nasal cannula/nonrebreather mask  Past Medical History  Asthma Migraine GERD  Significant Hospital Events   September 5 admission September 17 epistaxis while on full dose anticoagulation, adjusted back to prophylactic dosing September 19 transfer to ICU  Consults:  Pulmonary and critical care medicine  Procedures:    Significant Diagnostic Tests:  September 15 CT angiogram chest images independently reviewed showing no pulmonary embolism, diffuse bilateral airspace disease  Micro Data:  September 5 SARS-CoV-2 positive September 5 blood culture negative  Antimicrobials:  September 5 remdesivir> September 9 September 5 Solu-Medrol> tapered to prednisone current September 5 baricitinib> September 18  Interim history/subjective:  Yesterday in the evening patient became more anxious, tachypneic and tachycardic, initially refused to wear BiPAP but then after counseling BiPAP was placed with improvement in oxygenation and work of breathing. This morning BiPAP was taken off.  Patient refused cortack placement Objective   Blood pressure 134/82, pulse (!) 115, temperature 98.1 F (36.7 C), temperature source Axillary, resp. rate (!) 24, height 4\' 11"  (1.499 m), weight 81.5 kg, SpO2 (!) 84 %.    Vent Mode: BIPAP FiO2 (%):  [100 %] 100 % Set Rate:  [15 bmp] 15 bmp PEEP:  [10 cmH20] 10 cmH20   Intake/Output Summary (Last 24 hours) at 06/14/2020 1121 Last data filed at 06/14/2020 0800 Gross per 24 hour  Intake 500.45  ml  Output 451 ml  Net 49.45 ml   Filed Weights   06/12/20 0442 06/13/20 0500 06/14/20 0500  Weight: 86.2 kg 84.3 kg 81.5 kg    Examination: General: Middle-aged obese female, on high flow nasal cannula/NRB mask HENT: NCAT OP clear, mucosa is dry PULM: Fine crackles at bases bilaterally, no crackles CV: RRR, no mgr GI: BS+, soft, nontender MSK: normal bulk and tone Neuro: awake, alert, no distress, MAEW Skin: No rash  Resolved Hospital Problem list   Epistaxis  Assessment & Plan:  Acute hypoxic respiratory failure due to ARDS from COVID-19 pneumonia Patient continued to remain hypoxic even with maximum high flow nasal cannula with nonrebreather mask Will try to put on BiPAP intermittently during daytime and at bedtime Decrease Solu-Medrol further to 40 mg twice daily Cefepime has been discontinued as cultures have been negative She remained critically ill and at high risk of endotracheal intubation considering persistent hypoxia and intermittent increased work of breathing  Asthma, not in exacerbation Albuterol as needed Dulera  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 Continue Lantus twice daily and NovoLog insulin   Best practice:  Diet: regular Pain/Anxiety/Delirium protocol (if indicated): IV Ativan as needed VAP protocol (if indicated): n/a DVT prophylaxis: lovenox GI prophylaxis: n/a Glucose control: Lantus and SSI Mobility: out of bed as tolerated Code Status: full Family Communication: I will update patient's husband over the phone Disposition: to ICU  Labs   CBC: Recent Labs  Lab 06/08/20 0500 06/08/20 0500 06/09/20 0248 06/10/20 0251 06/11/20 0419 06/12/20 0230 06/13/20 1144  WBC 14.2*  --  18.8* 19.1* 15.2* 19.9*  --   HGB 13.3   < > 13.8 14.0 13.9 15.1* 12.9  HCT 40.8   < > 42.3 42.5 42.8 46.9* 38.0  MCV 89.3  --  89.8 89.1 89.4 90.5  --   PLT 392  --  442* 453* 449* 443*  --    < > = values in this interval not  displayed.    Basic Metabolic Panel: Recent Labs  Lab 06/08/20 0500 06/08/20 0500 06/09/20 0248 06/10/20 0251 06/11/20 0419 06/12/20 0230 06/13/20 1144  NA 137   < > 138 137 137 138 139  K 4.5   < > 5.2* 4.3 4.2 4.2 4.4  CL 100  --  99 98 100 100  --   CO2 27  --  28 23 25 23   --   GLUCOSE 157*  --  141* 210* 207* 214*  --   BUN 22*  --  28* 39* 31* 30*  --   CREATININE 0.86  --  0.90 1.00 0.88 0.87  --   CALCIUM 8.7*  --  8.9 8.9 9.0 9.6  --   MG  --   --   --   --   --  2.4  --   PHOS  --   --   --   --   --  3.9  --    < > = values in this interval not displayed.   GFR: Estimated Creatinine Clearance: 63.5 mL/min (by C-G formula based on SCr of 0.87 mg/dL). Recent Labs  Lab 06/09/20 0248 06/10/20 0251 06/11/20 0419 06/12/20 0230  PROCALCITON  --  <0.10  --   --   WBC 18.8* 19.1* 15.2* 19.9*    Liver Function Tests: Recent Labs  Lab 06/08/20 0500 06/09/20 0248 06/10/20 0251 06/11/20 0419  AST 23 31 22 21   ALT 20 17 24 26   ALKPHOS 74 78 81 77  BILITOT 0.8 1.8* 1.0 0.9  PROT 6.0* 6.3* 6.5 6.5  ALBUMIN 2.3* 2.4* 2.6* 2.4*   No results for input(s): LIPASE, AMYLASE in the last 168 hours. No results for input(s): AMMONIA in the last 168 hours.  ABG    Component Value Date/Time   PHART 7.467 (H) 06/13/2020 1144   PCO2ART 35.6 06/13/2020 1144   PO2ART 43 (L) 06/13/2020 1144   HCO3 25.7 06/13/2020 1144   TCO2 27 06/13/2020 1144   O2SAT 82.0 06/13/2020 1144     Coagulation Profile: No results for input(s): INR, PROTIME in the last 168 hours.  Cardiac Enzymes: No results for input(s): CKTOTAL, CKMB, CKMBINDEX, TROPONINI in the last 168 hours.  HbA1C: Hemoglobin A1C  Date/Time Value Ref Range Status  07/10/2012 06:44 PM 5.8  Final   Hgb A1c MFr Bld  Date/Time Value Ref Range Status  05/28/2020 04:00 AM 6.9 (H) 4.8 - 5.6 % Final    Comment:    (NOTE) Pre diabetes:          5.7%-6.4%  Diabetes:              >6.4%  Glycemic control for    <7.0% adults with diabetes     CBG: Recent Labs  Lab 06/13/20 0822 06/13/20 1244 06/13/20 1617 06/13/20 2140 06/14/20 0811  GLUCAP 125* 196* 180* 107* 109*    Review of Systems:   Gen: Denies fever, chills, weight change,+ fatigue, night sweats HEENT: Denies blurred vision, double vision, hearing loss, tinnitus, sinus congestion, rhinorrhea, sore throat, neck stiffness, dysphagia PULM: Positive for shortness of breath, dry cough and chest congestion CV: Denies chest pain, edema, orthopnea, paroxysmal nocturnal dyspnea, palpitations GI:  Denies abdominal pain, nausea, vomiting, diarrhea, hematochezia, melena, constipation, change in bowel habits GU: Denies dysuria, hematuria, polyuria, oliguria, urethral discharge Endocrine: Denies hot or cold intolerance, polyuria, polyphagia or appetite change Derm: Denies rash, dry skin, scaling or peeling skin change Heme: Denies easy bruising, bleeding, bleeding gums Neuro: Denies headache, numbness, weakness, slurred speech, loss of memory or consciousness   Past Medical History  She,  has a past medical history of Acid reflux, Anxiety, Asthma, Migraine, Restless leg syndrome, Skin cancer, basal cell, and Tachycardia.   Surgical History    Past Surgical History:  Procedure Laterality Date  . ABDOMINAL HYSTERECTOMY    . TONSILLECTOMY       Social History   reports that she has never smoked. She has never used smokeless tobacco. She reports current alcohol use. She reports that she does not use drugs.   Family History   Her family history includes Hypertension in her maternal grandmother and mother. There is no history of CAD.   Allergies Allergies  Allergen Reactions  . Latex Rash and Anaphylaxis  . Other     BEE STINGS  . Avelox [Moxifloxacin Hcl In Nacl] Itching and Rash     Home Medications  Prior to Admission medications   Medication Sig Start Date End Date Taking? Authorizing Provider  albuterol (PROAIR HFA) 108 (90  Base) MCG/ACT inhaler Inhale two puffs every four to six hours as needed for cough or wheeze. Patient taking differently: Inhale 2 puffs into the lungs every 4 (four) hours as needed for wheezing or shortness of breath.  01/14/18  Yes Kozlow, Donnamarie Poag, MD  beclomethasone (QVAR REDIHALER) 80 MCG/ACT inhaler Inhale two puffs one to two times daily during asthma flare-up as directed.  Rinse, gargle, and spit after use. Patient taking differently: Inhale 2 puffs into the lungs 2 (two) times daily. Rinse, gargle, and spit after use. 05/21/20  Yes Kozlow, Donnamarie Poag, MD  budesonide-formoterol (SYMBICORT) 160-4.5 MCG/ACT inhaler INHALE TWO PUFFS USING SPACER TWICE DAILY TO PREVENT COUGH OR WHEEZE. RINSE, GARGLE, AND SPIT AFTER USE. Patient taking differently: Inhale 2 puffs into the lungs in the morning and at bedtime. RINSE, GARGLE, AND SPIT AFTER USE. 01/09/20  Yes Kozlow, Donnamarie Poag, MD  buPROPion (WELLBUTRIN XL) 150 MG 24 hr tablet Take 150 mg by mouth in the morning.  01/04/20  Yes [provider]  diltiazem (CARDIZEM CD) 120 MG 24 hr capsule Take 120 mg by mouth at bedtime.  08/12/16  Yes [provider]  EPINEPHrine 0.3 mg/0.3 mL IJ SOAJ injection Inject 0.3 mLs (0.3 mg total) into the muscle as needed for anaphylaxis. 01/05/20  Yes Kozlow, Donnamarie Poag, MD  fluticasone (FLONASE) 50 MCG/ACT nasal spray USE ONE TO TWO SPRAYS IN EACH NOSTRIL ONCE DAILY. Patient taking differently: Place 1-2 sprays into both nostrils daily.  11/23/19  Yes Kozlow, Donnamarie Poag, MD  hydrochlorothiazide (MICROZIDE) 12.5 MG capsule Take 12.5 mg by mouth daily.  01/11/19  Yes [provider]  ipratropium-albuterol (DUONEB) 0.5-2.5 (3) MG/3ML SOLN Take 3 mLs by nebulization every 4 (four) hours as needed. Patient taking differently: Take 3 mLs by nebulization every 4 (four) hours as needed (shortness of breath and wheezing).  11/07/18  Yes Malvin Johns, MD  montelukast (SINGULAIR) 10 MG tablet TAKE 1 TABLET BY MOUTH EVERYDAY AT  BEDTIME 05/21/20  Yes Kozlow, Donnamarie Poag, MD  pantoprazole (PROTONIX) 40 MG tablet Take one tablet by mouth once daily.  Increase to one tablet twice daily during flare-up. Patient  taking differently: Take 40 mg by mouth 2 (two) times daily.  11/25/18  Yes Kozlow, Donnamarie Poag, MD  pramipexole (MIRAPEX) 1 MG tablet Take 1 mg by mouth at bedtime.   Yes [provider]  tretinoin (RETIN-A) 0.05 % cream Apply 1 application topically at bedtime.  04/10/20  Yes [provider]  UBRELVY 100 MG TABS Take 100 mg by mouth daily as needed (stress induced migraine).  01/23/20  Yes [provider]     Total critical care time: 33 minutes  Performed by: [place] care time was exclusive of separately billable procedures and treating other patients.   Critical care was necessary to treat or prevent imminent or life-threatening deterioration.   Critical care was time spent personally by me on the following activities: development of treatment plan with patient and/or surrogate as well as nursing, discussions with consultants, evaluation of patient's response to treatment, examination of patient, obtaining history from patient or surrogate, ordering and performing treatments and interventions, ordering and review of laboratory studies, ordering and review of radiographic studies, pulse oximetry and re-evaluation of patient's condition.   Jacky Kindle MD Critical care physician [facility]  Pager: [phone] Mobile: [phone]

## 2020-06-14 NOTE — Progress Notes (Signed)
1215 MD Chand made aware of pt HR remains 120s; pt without complaints and asymptomatic. Per MD no new orders and continue to monitor at this time.   1635 pt nauseas, prn zofran given; HR now sustaining 130s-140; no other complaints.  MD Chand made aware. IVF ordered.

## 2020-06-14 NOTE — Progress Notes (Signed)
eLink Physician-Brief Progress Note Patient Name: Claudia Vega DOB: Jan 28, 1961 MRN: [id number]   Date of Service  06/14/2020  HPI/Events of Note  Patient worried about her home medications, principally Cardizem CD 120 mg and Singulair 10 mg HS, patient's blood pressure currently is 123/76.  eICU Interventions  Will continue to hold PO medications since she is on a 100 % non re-breather with PO2 85 %, elevated BP will be treated with iv PRN medications.        Kerry Kass Estefana Taylor 06/14/2020, 9:12 PM

## 2020-06-14 NOTE — Progress Notes (Signed)
2008 called [facility] about pt's HR sustained in the 130s and made them aware of pt's home medication Diltiazem 120mg  daily not on patients MAR per patients request. No new orders received. Will continue to monitor.

## 2020-06-14 NOTE — Progress Notes (Signed)
   06/14/20 2132  Vitals  BP 139/75  MAP (mmHg) 92  BP Location Left Leg  BP Method Automatic  Pulse Rate 77  ECG Heart Rate (!) 122  Resp (!) 21  Oxygen Therapy  SpO2 (!) 85 %  MEWS Score  MEWS Temp 0  MEWS Systolic 0  MEWS Pulse 2  MEWS RR 1  MEWS LOC 0  MEWS Score 3  MEWS Score Color Yellow  Post assist to the floor fall. Vital signs above. Pt assisted from chair to bed. Pt's legs gave out on pt. RN assisted pt to the floor slowly. Pt was not hurt in the process and VS were stable during assist. NT was called in to assist pt back to the bed. Pt is not in the bed resting comfortably. MD was made aware. Family not notified per patient. PT said it was not necessary to tell family. Will continue to monitor

## 2020-06-14 NOTE — Progress Notes (Signed)
Physical Therapy Treatment Patient Details Name: Claudia Vega MRN: [id number] DOB: [birth year] Today's Date: 06/14/2020    History of Present Illness Pt is a 59 y.o. female presenting 06/01/2020 with shortness of breath. Tested COVID +. Workup for acute hypoxic respiratory failure secondary to COVID-19 viral PNA. CT angio 9/15 negative for PE, ultrasound negative for DVTs. PMH including HTN, asthma.Transfer to ICU 9/19.     PT Comments    Goals have been updated to reflect covid decline from admission.  Pt needed encouragement to participate toward getting OOB to the chair today.   Pt initially with SpO2 in  the high 80's, but with transition to sitting SpO2 dropped to lower 70's with rise in HR, taking >5 min to recover over the 10 min sitting EOB`.  Pt showed need for little assist to stand and transfer to the chair, but became noticeably anxious needing mod to maximal coaching to aid pt in recovering with efficient breathing.   Follow Up Recommendations  Home health PT     Equipment Recommendations  Other (comment);None recommended by PT (TBD)    Recommendations for Other Services       Precautions / Restrictions Precautions Precautions: Other (comment) Precaution Comments: 50-70L H[facility]NC at 100% FiO2 + 15L NRB Restrictions Weight Bearing Restrictions: No    Mobility  Bed Mobility Overal bed mobility: Needs Assistance Bed Mobility: Supine to Sit     Supine to sit: Mod assist     General bed mobility comments: assist to guide BLEs to EOB and support at trunk to help conserve energy. Once EOB pt with increased anxiety  Transfers Overall transfer level: Needs assistance Equipment used: None Transfers: Sit to/from [facility] Sit to Stand: Min assist Stand pivot transfers: Min assist       General transfer comment: assist for steadying and lines management; able to stand statically in front of chair without assist. Mostly limited by  anxiety  Ambulation/Gait             General Gait Details: not enough reserve today   Stairs             Wheelchair Mobility    Modified Rankin (Stroke Patients Only)       Balance Overall balance assessment: Needs assistance Sitting-balance support: Bilateral upper extremity supported;Feet supported Sitting balance-Leahy Scale: Fair     Standing balance support: Bilateral upper extremity supported;No upper extremity supported;During functional activity Standing balance-Leahy Scale: Fair Standing balance comment: able to static stand without UE support                            Cognition Arousal/Alertness: Awake/alert Behavior During Therapy: Anxious Overall Cognitive Status: Impaired/Different from baseline                                 General Comments: anxious with mobility and requiring increased cues to implement deep breathing and relaxation. Increased time needed to process, but full cognition difficult to assess given curent status, Will continue to monitor      Exercises Other Exercises Other Exercises: warm up hip/knee flex/ext ROM with resisted extension.    General Comments General comments (skin integrity, edema, etc.): Initially 50L and 15L NRB, 100% FiO2,  Raised to 70L, SpO2 in the high 80's,.  Lowest SpO2 after coughing fit at EOB, down to 72%, HR 110's to lower 120's.  Recovery after  sitting at EOB took 10 minutes.  Pt needed maximal coaching for efficient breathing      Pertinent Vitals/Pain Pain Assessment: No/denies pain    Home Living                      Prior Function            PT Goals (current goals can now be found in the care plan section) Acute Rehab PT Goals Patient Stated Goal: to get better PT Goal Formulation: With patient Time For Goal Achievement: 06/21/20 Potential to Achieve Goals: Fair Progress towards PT goals: Progressing toward goals    Frequency    Min  3X/week      PT Plan Current plan remains appropriate    Co-evaluation              AM-PAC PT "6 Clicks" Mobility   Outcome Measure  Help needed turning from your back to your side while in a flat bed without using bedrails?: A Little Help needed moving from lying on your back to sitting on the side of a flat bed without using bedrails?: A Little Help needed moving to and from a bed to a chair (including a wheelchair)?: A Little Help needed standing up from a chair using your arms (e.g., wheelchair or bedside chair)?: A Little Help needed to walk in hospital room?: A Little Help needed climbing 3-5 steps with a railing? : A Little 6 Click Score: 18    End of Session Equipment Utilized During Treatment: Oxygen Activity Tolerance: Patient limited by fatigue Patient left: in chair;with call bell/phone within reach Nurse Communication: Mobility status PT Visit Diagnosis: Other abnormalities of gait and mobility (R26.89)     Time: 1660-6301 PT Time Calculation (min) (ACUTE ONLY): 31 min  Charges:  $Therapeutic Activity: 8-22 mins                     06/14/2020  Claudia Carne., PT Acute Rehabilitation Services [phone]  (pager) [phone]  (office)   Claudia Vega 06/14/2020, 6:28 PM

## 2020-06-15 ENCOUNTER — Inpatient Hospital Stay (HOSPITAL_COMMUNITY): Payer: 59

## 2020-06-15 LAB — BLOOD GAS, ARTERIAL
Acid-Base Excess: 0.4 mmol/L (ref 0.0–2.0)
Acid-base deficit: 1 mmol/L (ref 0.0–2.0)
Bicarbonate: 32.7 mmol/L — ABNORMAL HIGH (ref 20.0–28.0)
Bicarbonate: 30.5 mmol/L — ABNORMAL HIGH (ref 20.0–28.0)
Drawn by: 252031
Drawn by: 252031
FIO2: 100
FIO2: 100
O2 Saturation: 92.7 %
O2 Saturation: 89.9 %
Patient temperature: 37
Patient temperature: 37
pCO2 arterial: >120 mmHg (ref 32.0–48.0)
pCO2 arterial: >120 mmHg (ref 32.0–48.0)
pH, Arterial: 6.928 — CL (ref 7.350–7.450)
pH, Arterial: 6.963 — CL (ref 7.350–7.450)
pO2, Arterial: 114 mmHg — ABNORMAL HIGH (ref 83.0–108.0)
pO2, Arterial: 87.4 mmHg (ref 83.0–108.0)

## 2020-06-15 LAB — CBC
HCT: 43 % (ref 36.0–46.0)
Hemoglobin: 13.7 g/dL (ref 12.0–15.0)
MCH: 28.7 pg (ref 26.0–34.0)
MCHC: 31.9 g/dL (ref 30.0–36.0)
MCV: 90.1 fL (ref 80.0–100.0)
Platelets: 302 10*3/uL (ref 150–400)
RBC: 4.77 MIL/uL (ref 3.87–5.11)
RDW: 12.6 % (ref 11.5–15.5)
WBC: 15.9 10*3/uL — ABNORMAL HIGH (ref 4.0–10.5)
nRBC: 0 % (ref 0.0–0.2)

## 2020-06-15 LAB — PHOSPHORUS
Phosphorus: 4.5 mg/dL (ref 2.5–4.6)
Phosphorus: 11.9 mg/dL — ABNORMAL HIGH (ref 2.5–4.6)

## 2020-06-15 LAB — COMPREHENSIVE METABOLIC PANEL
ALT: 34 U/L (ref 0–44)
AST: 24 U/L (ref 15–41)
Albumin: 2.4 g/dL — ABNORMAL LOW (ref 3.5–5.0)
Alkaline Phosphatase: 86 U/L (ref 38–126)
Anion gap: 13 (ref 5–15)
BUN: 34 mg/dL — ABNORMAL HIGH (ref 6–20)
CO2: 22 mmol/L (ref 22–32)
Calcium: 9.2 mg/dL (ref 8.9–10.3)
Chloride: 104 mmol/L (ref 98–111)
Creatinine, Ser: 0.82 mg/dL (ref 0.44–1.00)
GFR calc Af Amer: >60 mL/min (ref >60)
GFR calc non Af Amer: >60 mL/min (ref >60)
Glucose, Bld: 194 mg/dL — ABNORMAL HIGH (ref 70–99)
Potassium: 4.6 mmol/L (ref 3.5–5.1)
Sodium: 139 mmol/L (ref 135–145)
Total Bilirubin: 0.8 mg/dL (ref 0.3–1.2)
Total Protein: 6.2 g/dL — ABNORMAL LOW (ref 6.5–8.1)

## 2020-06-15 LAB — GLUCOSE, CAPILLARY
Glucose-Capillary: 108 mg/dL — ABNORMAL HIGH (ref 70–99)
Glucose-Capillary: 134 mg/dL — ABNORMAL HIGH (ref 70–99)
Glucose-Capillary: 228 mg/dL — ABNORMAL HIGH (ref 70–99)
Glucose-Capillary: 302 mg/dL — ABNORMAL HIGH (ref 70–99)
Glucose-Capillary: 321 mg/dL — ABNORMAL HIGH (ref 70–99)

## 2020-06-15 LAB — POCT I-STAT 7, (LYTES, BLD GAS, ICA,H+H)
Acid-Base Excess: 4 mmol/L — ABNORMAL HIGH (ref 0.0–2.0)
Bicarbonate: 28.4 mmol/L — ABNORMAL HIGH (ref 20.0–28.0)
Calcium, Ion: 1.23 mmol/L (ref 1.15–1.40)
HCT: 41 % (ref 36.0–46.0)
Hemoglobin: 13.9 g/dL (ref 12.0–15.0)
O2 Saturation: 78 %
Potassium: 4.6 mmol/L (ref 3.5–5.1)
Sodium: 139 mmol/L (ref 135–145)
TCO2: 30 mmol/L (ref 22–32)
pCO2 arterial: 41.8 mmHg (ref 32.0–48.0)
pH, Arterial: 7.439 (ref 7.350–7.450)
pO2, Arterial: 41 mmHg — ABNORMAL LOW (ref 83.0–108.0)

## 2020-06-15 LAB — MAGNESIUM
Magnesium: 2.3 mg/dL (ref 1.7–2.4)
Magnesium: 3 mg/dL — ABNORMAL HIGH (ref 1.7–2.4)

## 2020-06-15 MED ORDER — DILTIAZEM 12 MG/ML ORAL SUSPENSION
60.0000 mg | Freq: Four times a day (QID) | ORAL | Status: DC
  Administered 2020-06-18 (×2): 60 mg
  Filled 2020-06-15 (×11): qty 6

## 2020-06-15 MED ORDER — LACTULOSE 10 GM/15ML PO SOLN
20.0000 g | Freq: Three times a day (TID) | ORAL | Status: DC
  Administered 2020-06-15 (×2): 20 g via ORAL
  Filled 2020-06-15 (×2): qty 30

## 2020-06-15 MED ORDER — FUROSEMIDE 10 MG/ML IJ SOLN
40.0000 mg | Freq: Once | INTRAMUSCULAR | Status: AC
  Administered 2020-06-15: 40 mg via INTRAVENOUS
  Filled 2020-06-15: qty 4

## 2020-06-15 MED ORDER — MIDAZOLAM HCL 2 MG/2ML IJ SOLN
INTRAMUSCULAR | Status: AC
  Filled 2020-06-15: qty 2

## 2020-06-15 MED ORDER — PROSOURCE TF PO LIQD
45.0000 mL | Freq: Two times a day (BID) | ORAL | Status: DC
  Administered 2020-06-15 – 2020-06-16 (×2): 45 mL
  Filled 2020-06-15 (×2): qty 45

## 2020-06-15 MED ORDER — MIDAZOLAM HCL 2 MG/2ML IJ SOLN
2.0000 mg | INTRAMUSCULAR | Status: DC | PRN
  Administered 2020-06-15 – 2020-06-16 (×4): 2 mg via INTRAVENOUS
  Filled 2020-06-15 (×3): qty 2

## 2020-06-15 MED ORDER — ROCURONIUM BROMIDE 50 MG/5ML IV SOLN
100.0000 mg | Freq: Once | INTRAVENOUS | Status: AC
  Administered 2020-06-15: 100 mg via INTRAVENOUS

## 2020-06-15 MED ORDER — POLYETHYLENE GLYCOL 3350 17 G PO PACK
17.0000 g | PACK | Freq: Every day | ORAL | Status: DC
  Administered 2020-06-16 – 2020-06-22 (×7): 17 g
  Filled 2020-06-15 (×7): qty 1

## 2020-06-15 MED ORDER — IPRATROPIUM-ALBUTEROL 0.5-2.5 (3) MG/3ML IN SOLN
3.0000 mL | Freq: Four times a day (QID) | RESPIRATORY_TRACT | Status: DC
  Administered 2020-06-15 – 2020-06-17 (×7): 3 mL via RESPIRATORY_TRACT
  Filled 2020-06-15 (×6): qty 3

## 2020-06-15 MED ORDER — LACTULOSE 10 GM/15ML PO SOLN
20.0000 g | Freq: Three times a day (TID) | ORAL | Status: AC
  Administered 2020-06-15 – 2020-06-16 (×4): 20 g
  Filled 2020-06-15 (×4): qty 30

## 2020-06-15 MED ORDER — OXYMETAZOLINE HCL 0.05 % NA SOLN
2.0000 | Freq: Every day | NASAL | Status: DC
  Filled 2020-06-15: qty 30

## 2020-06-15 MED ORDER — ASCORBIC ACID 500 MG PO TABS
500.0000 mg | ORAL_TABLET | Freq: Every day | ORAL | Status: DC
  Administered 2020-06-16 – 2020-06-22 (×7): 500 mg
  Filled 2020-06-15 (×7): qty 1

## 2020-06-15 MED ORDER — ETOMIDATE 2 MG/ML IV SOLN
20.0000 mg | Freq: Once | INTRAVENOUS | Status: AC
  Administered 2020-06-15: 20 mg via INTRAVENOUS

## 2020-06-15 MED ORDER — FENTANYL CITRATE (PF) 100 MCG/2ML IJ SOLN
50.0000 ug | Freq: Once | INTRAMUSCULAR | Status: AC
  Administered 2020-06-15: 50 ug via INTRAVENOUS

## 2020-06-15 MED ORDER — MONTELUKAST SODIUM 10 MG PO TABS
10.0000 mg | ORAL_TABLET | Freq: Every day | ORAL | Status: DC
  Administered 2020-06-15 – 2020-06-22 (×8): 10 mg
  Filled 2020-06-15 (×8): qty 1

## 2020-06-15 MED ORDER — ADULT MULTIVITAMIN W/MINERALS CH
1.0000 | ORAL_TABLET | Freq: Every day | ORAL | Status: DC
  Administered 2020-06-16 – 2020-06-22 (×7): 1
  Filled 2020-06-15 (×7): qty 1

## 2020-06-15 MED ORDER — VECURONIUM BROMIDE 10 MG IV SOLR
INTRAVENOUS | Status: AC
  Filled 2020-06-15: qty 10

## 2020-06-15 MED ORDER — OXYMETAZOLINE HCL 0.05 % NA SOLN
2.0000 | Freq: Two times a day (BID) | NASAL | Status: AC
  Filled 2020-06-15: qty 30

## 2020-06-15 MED ORDER — ZINC SULFATE 220 (50 ZN) MG PO CAPS
220.0000 mg | ORAL_CAPSULE | Freq: Every day | ORAL | Status: DC
  Administered 2020-06-16 – 2020-06-22 (×7): 220 mg
  Filled 2020-06-15 (×7): qty 1

## 2020-06-15 MED ORDER — SENNOSIDES-DOCUSATE SODIUM 8.6-50 MG PO TABS
2.0000 | ORAL_TABLET | Freq: Two times a day (BID) | ORAL | Status: DC
  Administered 2020-06-15 – 2020-06-22 (×14): 2
  Filled 2020-06-15 (×15): qty 2

## 2020-06-15 MED ORDER — FENTANYL 2500MCG IN NS 250ML (10MCG/ML) PREMIX INFUSION
0.0000 ug/h | INTRAVENOUS | Status: DC
  Administered 2020-06-15: 50 ug/h via INTRAVENOUS
  Administered 2020-06-16: 400 ug/h via INTRAVENOUS
  Administered 2020-06-16: 200 ug/h via INTRAVENOUS
  Administered 2020-06-16 – 2020-06-22 (×23): 400 ug/h via INTRAVENOUS
  Administered 2020-06-23: 350 ug/h via INTRAVENOUS
  Administered 2020-06-23: 400 ug/h via INTRAVENOUS
  Filled 2020-06-15 (×28): qty 250

## 2020-06-15 MED ORDER — DILTIAZEM HCL ER COATED BEADS 120 MG PO CP24
120.0000 mg | ORAL_CAPSULE | Freq: Every day | ORAL | Status: DC
  Administered 2020-06-15: 120 mg via ORAL
  Filled 2020-06-15: qty 1

## 2020-06-15 MED ORDER — VECURONIUM BROMIDE 10 MG IV SOLR
0.1000 mg/kg | Freq: Once | INTRAVENOUS | Status: AC
  Administered 2020-06-15: 8.2 mg via INTRAVENOUS

## 2020-06-15 MED ORDER — MIDAZOLAM HCL 2 MG/2ML IJ SOLN
2.0000 mg | Freq: Once | INTRAMUSCULAR | Status: AC
  Administered 2020-06-15: 2 mg via INTRAVENOUS

## 2020-06-15 MED ORDER — LINAGLIPTIN 5 MG PO TABS
5.0000 mg | ORAL_TABLET | Freq: Every day | ORAL | Status: DC
  Administered 2020-06-16 – 2020-06-22 (×7): 5 mg
  Filled 2020-06-15 (×7): qty 1

## 2020-06-15 MED ORDER — VITAL HIGH PROTEIN PO LIQD
1000.0000 mL | ORAL | Status: DC
  Administered 2020-06-15: 1000 mL

## 2020-06-15 MED ORDER — FENTANYL CITRATE (PF) 100 MCG/2ML IJ SOLN
INTRAMUSCULAR | Status: AC
  Filled 2020-06-15: qty 2

## 2020-06-15 MED ORDER — PRAMIPEXOLE DIHYDROCHLORIDE 1 MG PO TABS
1.0000 mg | ORAL_TABLET | Freq: Every day | ORAL | Status: DC
  Administered 2020-06-15 – 2020-06-22 (×8): 1 mg
  Filled 2020-06-15 (×8): qty 1

## 2020-06-15 MED ORDER — ACETAMINOPHEN 160 MG/5ML PO SOLN: 650.0000 mg | Freq: Four times a day (QID) | ORAL | Status: DC | PRN

## 2020-06-15 NOTE — Progress Notes (Signed)
[facility] Progress Note Patient Name: Claudia Vega DOB: 09-26-1960 MRN: [id number]   Date of Service  06/15/2020  HPI/Events of Note  Hypercapnic respiratory failure, patient is synchronous with the ventilator.  eICU Interventions  Respiratory rate increased to 35, ABG at 10:15 p.m.        Frederik Pear 06/15/2020, 9:13 PM

## 2020-06-15 NOTE — Progress Notes (Signed)
Patient was noted to be hypoxic into the low 70s and high 60s while on high flow nasal cannula and nonrebreather mask.  She looked cyanotic.  I explained to her that she is getting more and more hypoxic and that we will put her at risk of multiorgan failure due to hypoxemia. Patient agreed for endotracheal intubation and mechanical ventilation.  Patient's husband was called and explained and he agreed with the plan.  Patient was endotracheally intubated and placed on mechanical ventilation.  Currently her O2 sat is 95% 100% FiO2 with PEEP of 18.

## 2020-06-15 NOTE — Progress Notes (Signed)
PT Cancellation Note  Patient Details Name: Claudia Vega MRN: [id number] DOB: 07-May-1961   Cancelled Treatment:    Reason Eval/Treat Not Completed: Patient not medically ready.  Pt's SpO2 consistently in high 70's/low 80's.  No reserve to participate today. 06/15/2020  Ginger Carne., PT Acute Rehabilitation Services [phone]  (pager) [phone]  (office)   Claudia Vega 06/15/2020, 5:03 PM

## 2020-06-15 NOTE — Progress Notes (Addendum)
[place] [place] Progress Note Patient Name: [name] DOB: 1961-05-28 MRN: [id number]   Date of Service  06/15/2020  HPI/Events of Note  Ventilator dyssynchrony despite adequate sedation. Severe hypercapnic respiratory failure.  eICU Interventions  Vecuronium 8 mg iv x 1, Tidal volume increased to 7 ml / kg, and PEEP reduced to 14.        Kerry Kass Kawhi Diebold 06/15/2020, 11:21 PM

## 2020-06-15 NOTE — Procedures (Signed)
Intubation Procedure Note  ALEASHA FREGEAU  [id number]  [birth year]  Date:06/15/20  Time:5:20 PM   Provider Performing:Kasch Borquez    Procedure: Intubation (31500)  Indication(s) Respiratory Failure  Consent Risks of the procedure as well as the alternatives and risks of each were explained to the patient and/or caregiver.  Consent for the procedure was obtained and is signed in the bedside chart   Anesthesia Etomidate, Versed, Fentanyl and Rocuronium   Time Out Verified patient identification, verified procedure, site/side was marked, verified correct patient position, special equipment/implants available, medications/allergies/relevant history reviewed, required imaging and test results available.   Sterile Technique Usual hand hygeine, masks, and gloves were used   Procedure Description Patient positioned in bed supine.  Sedation given as noted above.  Patient was intubated with endotracheal tube using Glidescope.  View was Grade 1 full glottis .  Number of attempts was 1.  Colorimetric CO2 detector was consistent with tracheal placement.   Complications/Tolerance None; patient tolerated the procedure well. Chest X-ray is ordered to verify placement.   EBL Minimal   Specimen(s) None

## 2020-06-15 NOTE — Procedures (Signed)
Cortrak  Person Inserting Tube:  Esaw Dace, RD Tube Type:  Cortrak - 43 inches Tube Location:  Left nare Initial Placement:  Stomach Secured by: Bridle Technique Used to Measure Tube Placement:  Documented cm marking at nare/ corner of mouth Cortrak Secured At:  80 cm   Cortrak Tube Team Note:  Consult received to place a Cortrak feeding tube.   No x-ray is required. RN may begin using tube.   If the tube becomes dislodged please keep the tube and contact the Cortrak team at [email or web address] ([email or web address]) for replacement.  If after hours and replacement cannot be delayed, place a NG tube and confirm placement with an abdominal x-ray.    Kerman Passey MS, RDN, LDN, CNSC Registered Dietitian III Clinical Nutrition RD Pager and On-Call Pager Number Located in [place]

## 2020-06-15 NOTE — Progress Notes (Signed)
Brief Nutrition Note  Consult received for enteral/tube feeding initiation and management.  Adult Enteral Nutrition Protocol initiated. Full assessment to follow.  Admitting Dx: Hypoxia [R09.02] Acute hypoxemic respiratory failure due to COVID-19 ([place]) [U07.1, J96.01] Pneumonia due to COVID-19 virus [U07.1, J12.82]  Body mass index is 36.47 kg/m.   Labs:  Recent Labs  Lab 06/11/20 0419 06/11/20 0419 06/12/20 0230 06/12/20 0230 06/13/20 1144 06/15/20 0337 06/15/20 0919  NA 137   < > 138   < > 139 139 139  K 4.2   < > 4.2   < > 4.4 4.6 4.6  CL 100  --  100  --   --  104  --   CO2 25  --  23  --   --  22  --   BUN 31*  --  30*  --   --  34*  --   CREATININE 0.88  --  0.87  --   --  0.82  --   CALCIUM 9.0  --  9.6  --   --  9.2  --   MG  --   --  2.4  --   --  2.3  --   PHOS  --   --  3.9  --   --  4.5  --   GLUCOSE 207*  --  214*  --   --  194*  --    < > = values in this interval not displayed.   Kerman Passey MS, RDN, LDN, CNSC Registered Dietitian III Clinical Nutrition RD Pager and On-Call Pager Number Located in [place]

## 2020-06-15 NOTE — Progress Notes (Signed)
NAME:  Claudia Vega, MRN:  [id number], DOB:  1961/03/01, LOS: 13 ADMISSION DATE:  06/06/2020, CONSULTATION DATE: September 19 REFERRING MD: Dr. Noah Delaine, CHIEF COMPLAINT: Dyspnea  Brief History   59 year old female admitted for Covid pneumonia on May 27, 2020 treated with remdesivir, Solu-Medrol, baricitinib developed worsening hypoxemia on June 10, 2020 so pulmonary and critical care medicine was consulted.  Has been on CPAP alternating with high flow nasal cannula/nonrebreather mask  Past Medical History  Asthma Migraine GERD  Significant Hospital Events   September 5 admission September 17 epistaxis while on full dose anticoagulation, adjusted back to prophylactic dosing September 19 transfer to ICU  Consults:  Pulmonary and critical care medicine  Procedures:    Significant Diagnostic Tests:  September 15 CT angiogram chest images independently reviewed showing no pulmonary embolism, diffuse bilateral airspace disease  Micro Data:  September 5 SARS-CoV-2 positive September 5 blood culture negative  Antimicrobials:  September 5 remdesivir> September 9 September 5 Solu-Medrol> tapered to prednisone current September 5 baricitinib> September 18  Interim history/subjective:  No new change patient has been intermittently tolerating high flow nasal cannula and using BiPAP mostly at night.  At baseline with high flow nasal cannula oxygen her oxygen saturation is around 80% Objective   Blood pressure 110/64, pulse (!) 114, temperature 98.4 F (36.9 C), temperature source Axillary, resp. rate (!) 25, height 4\' 11"  (1.499 m), weight 81.9 kg, SpO2 (!) 82 %.    Vent Mode: BIPAP FiO2 (%):  [100 %] 100 % Set Rate:  [15 bmp] 15 bmp PEEP:  [10 cmH20] 10 cmH20   Intake/Output Summary (Last 24 hours) at 06/15/2020 1434 Last data filed at 06/15/2020 1400 Gross per 24 hour  Intake 1007.65 ml  Output 600 ml  Net 407.65 ml   Filed Weights   06/13/20 0500 06/14/20 0500  06/15/20 0500  Weight: 84.3 kg 81.5 kg 81.9 kg    Examination: General: Middle-aged obese female, on high flow nasal cannula/NRB mask HENT: NCAT OP clear, mucosa is moist PULM: Fine crackles at bases bilaterally, no wheezes or rhonchi CV: RRR, no mgr GI: BS+, soft, nontender MSK: normal bulk and tone Neuro: awake, alert, no distress, MAEW Skin: No rash  Resolved Hospital Problem list   Epistaxis  Assessment & Plan:  Acute hypoxic respiratory failure due to ARDS from COVID-19 pneumonia Patient continued to remain hypoxic even with maximum high flow nasal cannula with nonrebreather mask ABGs were repeated today on high flow nasal cannula which shows PaO2 of 4100% FiO2 Will try to put on BiPAP intermittently during daytime and at bedtime Continue Solu-Medrol 40 mg twice daily, will continue to taper  She remained critically ill and at high risk of endotracheal intubation considering persistent hypoxia and intermittent increased work of breathing  Asthma, not in exacerbation Albuterol as needed Dulera  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 Continue Lantus twice daily and NovoLog insulin   Best practice:  Diet: regular Pain/Anxiety/Delirium protocol (if indicated): IV Ativan as needed VAP protocol (if indicated): n/a DVT prophylaxis: lovenox GI prophylaxis: n/a Glucose control: Lantus and SSI Mobility: out of bed as tolerated Code Status: full Family Communication: Patient family was updated over the phone Disposition: to ICU  Labs   CBC: Recent Labs  Lab 06/09/20 0248 06/09/20 0248 06/10/20 0251 06/10/20 0251 06/11/20 0419 06/12/20 0230 06/13/20 1144 06/15/20 0337 06/15/20 0919  WBC 18.8*  --  19.1*  --  15.2* 19.9*  --  15.9*  --   HGB  13.8   < > 14.0   < > 13.9 15.1* 12.9 13.7 13.9  HCT 42.3   < > 42.5   < > 42.8 46.9* 38.0 43.0 41.0  MCV 89.8  --  89.1  --  89.4 90.5  --  90.1  --   PLT 442*  --  453*  --  449* 443*  --  302  --    <  > = values in this interval not displayed.    Basic Metabolic Panel: Recent Labs  Lab 06/09/20 0248 06/09/20 0248 06/10/20 0251 06/10/20 0251 06/11/20 0419 06/12/20 0230 06/13/20 1144 06/15/20 0337 06/15/20 0919  NA 138   < > 137   < > 137 138 139 139 139  K 5.2*   < > 4.3   < > 4.2 4.2 4.4 4.6 4.6  CL 99  --  98  --  100 100  --  104  --   CO2 28  --  23  --  25 23  --  22  --   GLUCOSE 141*  --  210*  --  207* 214*  --  194*  --   BUN 28*  --  39*  --  31* 30*  --  34*  --   CREATININE 0.90  --  1.00  --  0.88 0.87  --  0.82  --   CALCIUM 8.9  --  8.9  --  9.0 9.6  --  9.2  --   MG  --   --   --   --   --  2.4  --  2.3  --   PHOS  --   --   --   --   --  3.9  --  4.5  --    < > = values in this interval not displayed.   GFR: Estimated Creatinine Clearance: 67.6 mL/min (by C-G formula based on SCr of 0.82 mg/dL). Recent Labs  Lab 06/10/20 0251 06/11/20 0419 06/12/20 0230 06/15/20 0337  PROCALCITON <0.10  --   --   --   WBC 19.1* 15.2* 19.9* 15.9*    Liver Function Tests: Recent Labs  Lab 06/09/20 0248 06/10/20 0251 06/11/20 0419 06/15/20 0337  AST 31 22 21 24   ALT 17 24 26  34  ALKPHOS 78 81 77 86  BILITOT 1.8* 1.0 0.9 0.8  PROT 6.3* 6.5 6.5 6.2*  ALBUMIN 2.4* 2.6* 2.4* 2.4*   No results for input(s): LIPASE, AMYLASE in the last 168 hours. No results for input(s): AMMONIA in the last 168 hours.  ABG    Component Value Date/Time   PHART 7.439 06/15/2020 0919   PCO2ART 41.8 06/15/2020 0919   PO2ART 41 (L) 06/15/2020 0919   HCO3 28.4 (H) 06/15/2020 0919   TCO2 30 06/15/2020 0919   O2SAT 78.0 06/15/2020 0919     Coagulation Profile: No results for input(s): INR, PROTIME in the last 168 hours.  Cardiac Enzymes: No results for input(s): CKTOTAL, CKMB, CKMBINDEX, TROPONINI in the last 168 hours.  HbA1C: Hemoglobin A1C  Date/Time Value Ref Range Status  07/10/2012 06:44 PM 5.8  Final   Hgb A1c MFr Bld  Date/Time Value Ref Range Status    05/28/2020 04:00 AM 6.9 (H) 4.8 - 5.6 % Final    Comment:    (NOTE) Pre diabetes:          5.7%-6.4%  Diabetes:              >6.4%  Glycemic control for   <7.0% adults with diabetes     CBG: Recent Labs  Lab 06/14/20 1636 06/14/20 1827 06/14/20 2049 06/15/20 0827 06/15/20 1201  GLUCAP 123* 124* 141* 134* 228*    Review of Systems:   Gen: Denies fever, chills, weight change,+ fatigue, night sweats HEENT: Denies blurred vision, double vision, hearing loss, tinnitus, sinus congestion, rhinorrhea, sore throat, neck stiffness, dysphagia PULM: Positive for shortness of breath, dry cough and chest congestion CV: Denies chest pain, edema, orthopnea, paroxysmal nocturnal dyspnea, palpitations GI: Denies abdominal pain, nausea, vomiting, diarrhea, hematochezia, melena, constipation, change in bowel habits GU: Denies dysuria, hematuria, polyuria, oliguria, urethral discharge Endocrine: Denies hot or cold intolerance, polyuria, polyphagia or appetite change Derm: Denies rash, dry skin, scaling or peeling skin change Heme: Denies easy bruising, bleeding, bleeding gums Neuro: Denies headache, numbness, weakness, slurred speech, loss of memory or consciousness   Past Medical History  She,  has a past medical history of Acid reflux, Anxiety, Asthma, Migraine, Restless leg syndrome, Skin cancer, basal cell, and Tachycardia.   Surgical History    Past Surgical History:  Procedure Laterality Date  . ABDOMINAL HYSTERECTOMY    . TONSILLECTOMY       Social History   reports that she has never smoked. She has never used smokeless tobacco. She reports current alcohol use. She reports that she does not use drugs.   Family History   Her family history includes Hypertension in her maternal grandmother and mother. There is no history of CAD.   Allergies Allergies  Allergen Reactions  . Latex Rash and Anaphylaxis  . Other     BEE STINGS  . Avelox [Moxifloxacin Hcl In Nacl] Itching and  Rash     Home Medications  Prior to Admission medications   Medication Sig Start Date End Date Taking? Authorizing Provider  albuterol (PROAIR HFA) 108 (90 Base) MCG/ACT inhaler Inhale two puffs every four to six hours as needed for cough or wheeze. Patient taking differently: Inhale 2 puffs into the lungs every 4 (four) hours as needed for wheezing or shortness of breath.  01/14/18  Yes Kozlow, Donnamarie Poag, MD  beclomethasone (QVAR REDIHALER) 80 MCG/ACT inhaler Inhale two puffs one to two times daily during asthma flare-up as directed.  Rinse, gargle, and spit after use. Patient taking differently: Inhale 2 puffs into the lungs 2 (two) times daily. Rinse, gargle, and spit after use. 05/21/20  Yes Kozlow, Donnamarie Poag, MD  budesonide-formoterol (SYMBICORT) 160-4.5 MCG/ACT inhaler INHALE TWO PUFFS USING SPACER TWICE DAILY TO PREVENT COUGH OR WHEEZE. RINSE, GARGLE, AND SPIT AFTER USE. Patient taking differently: Inhale 2 puffs into the lungs in the morning and at bedtime. RINSE, GARGLE, AND SPIT AFTER USE. 01/09/20  Yes Kozlow, Donnamarie Poag, MD  buPROPion (WELLBUTRIN XL) 150 MG 24 hr tablet Take 150 mg by mouth in the morning.  01/04/20  Yes [provider]  diltiazem (CARDIZEM CD) 120 MG 24 hr capsule Take 120 mg by mouth at bedtime.  08/12/16  Yes [provider]  EPINEPHrine 0.3 mg/0.3 mL IJ SOAJ injection Inject 0.3 mLs (0.3 mg total) into the muscle as needed for anaphylaxis. 01/05/20  Yes Kozlow, Donnamarie Poag, MD  fluticasone (FLONASE) 50 MCG/ACT nasal spray USE ONE TO TWO SPRAYS IN EACH NOSTRIL ONCE DAILY. Patient taking differently: Place 1-2 sprays into both nostrils daily.  11/23/19  Yes Kozlow, Donnamarie Poag, MD  hydrochlorothiazide (MICROZIDE) 12.5 MG capsule Take 12.5 mg by mouth daily.  01/11/19  Yes [provider]  ipratropium-albuterol (DUONEB) 0.5-2.5 (3) MG/3ML SOLN Take 3 mLs by nebulization every 4 (four) hours as needed. Patient taking differently: Take 3 mLs by nebulization every 4  (four) hours as needed (shortness of breath and wheezing).  11/07/18  Yes Malvin Johns, MD  montelukast (SINGULAIR) 10 MG tablet TAKE 1 TABLET BY MOUTH EVERYDAY AT BEDTIME 05/21/20  Yes Kozlow, Donnamarie Poag, MD  pantoprazole (PROTONIX) 40 MG tablet Take one tablet by mouth once daily.  Increase to one tablet twice daily during flare-up. Patient taking differently: Take 40 mg by mouth 2 (two) times daily.  11/25/18  Yes Kozlow, Donnamarie Poag, MD  pramipexole (MIRAPEX) 1 MG tablet Take 1 mg by mouth at bedtime.   Yes [provider]  tretinoin (RETIN-A) 0.05 % cream Apply 1 application topically at bedtime.  04/10/20  Yes [provider]  UBRELVY 100 MG TABS Take 100 mg by mouth daily as needed (stress induced migraine).  01/23/20  Yes [provider]     Total critical care time: 31 minutes  Performed by: [place] care time was exclusive of separately billable procedures and treating other patients.   Critical care was necessary to treat or prevent imminent or life-threatening deterioration.   Critical care was time spent personally by me on the following activities: development of treatment plan with patient and/or surrogate as well as nursing, discussions with consultants, evaluation of patient's response to treatment, examination of patient, obtaining history from patient or surrogate, ordering and performing treatments and interventions, ordering and review of laboratory studies, ordering and review of radiographic studies, pulse oximetry and re-evaluation of patient's condition.   Jacky Kindle MD Critical care physician [facility]  Pager: [phone] Mobile: [phone]

## 2020-06-15 NOTE — Progress Notes (Signed)
Unable to obtain ABGx2. RN notified.

## 2020-06-16 ENCOUNTER — Inpatient Hospital Stay (HOSPITAL_COMMUNITY): Payer: 59

## 2020-06-16 LAB — BLOOD GAS, ARTERIAL
Acid-base deficit: 0.4 mmol/L (ref 0.0–2.0)
Acid-base deficit: 0.4 mmol/L (ref 0.0–2.0)
Bicarbonate: 30.4 mmol/L — ABNORMAL HIGH (ref 20.0–28.0)
Bicarbonate: 29.6 mmol/L — ABNORMAL HIGH (ref 20.0–28.0)
Drawn by: 253031
FIO2: 100
FIO2: 100
O2 Saturation: 94.5 %
O2 Saturation: 93.8 %
Patient temperature: 36.6
Patient temperature: 36.6
pCO2 arterial: >120 mmHg (ref 32.0–48.0)
pCO2 arterial: 112 mmHg (ref 32.0–48.0)
pH, Arterial: 7.007 — CL (ref 7.350–7.450)
pH, Arterial: 7.048 — CL (ref 7.350–7.450)
pO2, Arterial: 107 mmHg (ref 83.0–108.0)
pO2, Arterial: 80 mmHg — ABNORMAL LOW (ref 83.0–108.0)

## 2020-06-16 LAB — CBC WITH DIFFERENTIAL/PLATELET
Abs Immature Granulocytes: 0.75 10*3/uL — ABNORMAL HIGH (ref 0.00–0.07)
Basophils Absolute: 0.1 10*3/uL (ref 0.0–0.1)
Basophils Relative: 0 %
Eosinophils Absolute: 0 10*3/uL (ref 0.0–0.5)
Eosinophils Relative: 0 %
HCT: 46.7 % — ABNORMAL HIGH (ref 36.0–46.0)
Hemoglobin: 14.2 g/dL (ref 12.0–15.0)
Immature Granulocytes: 3 %
Lymphocytes Relative: 1 %
Lymphs Abs: 0.4 10*3/uL — ABNORMAL LOW (ref 0.7–4.0)
MCH: 29.2 pg (ref 26.0–34.0)
MCHC: 30.4 g/dL (ref 30.0–36.0)
MCV: 95.9 fL (ref 80.0–100.0)
Monocytes Absolute: 0.6 10*3/uL (ref 0.1–1.0)
Monocytes Relative: 2 %
Neutro Abs: 27.1 10*3/uL — ABNORMAL HIGH (ref 1.7–7.7)
Neutrophils Relative %: 94 %
Platelets: 307 10*3/uL (ref 150–400)
RBC: 4.87 MIL/uL (ref 3.87–5.11)
RDW: 12.7 % (ref 11.5–15.5)
WBC: 28.9 10*3/uL — ABNORMAL HIGH (ref 4.0–10.5)
nRBC: 0.1 % (ref 0.0–0.2)

## 2020-06-16 LAB — BASIC METABOLIC PANEL
Anion gap: 18 — ABNORMAL HIGH (ref 5–15)
BUN: 59 mg/dL — ABNORMAL HIGH (ref 6–20)
CO2: 20 mmol/L — ABNORMAL LOW (ref 22–32)
Calcium: 8.7 mg/dL — ABNORMAL LOW (ref 8.9–10.3)
Chloride: 105 mmol/L (ref 98–111)
Creatinine, Ser: 1.61 mg/dL — ABNORMAL HIGH (ref 0.44–1.00)
GFR calc Af Amer: 40 mL/min — ABNORMAL LOW (ref >60)
GFR calc non Af Amer: 34 mL/min — ABNORMAL LOW (ref >60)
Glucose, Bld: 212 mg/dL — ABNORMAL HIGH (ref 70–99)
Potassium: 5.7 mmol/L — ABNORMAL HIGH (ref 3.5–5.1)
Sodium: 143 mmol/L (ref 135–145)

## 2020-06-16 LAB — MAGNESIUM
Magnesium: 2.9 mg/dL — ABNORMAL HIGH (ref 1.7–2.4)
Magnesium: 2.7 mg/dL — ABNORMAL HIGH (ref 1.7–2.4)

## 2020-06-16 LAB — GLUCOSE, CAPILLARY
Glucose-Capillary: 195 mg/dL — ABNORMAL HIGH (ref 70–99)
Glucose-Capillary: 202 mg/dL — ABNORMAL HIGH (ref 70–99)
Glucose-Capillary: 246 mg/dL — ABNORMAL HIGH (ref 70–99)
Glucose-Capillary: 247 mg/dL — ABNORMAL HIGH (ref 70–99)
Glucose-Capillary: 255 mg/dL — ABNORMAL HIGH (ref 70–99)
Glucose-Capillary: 264 mg/dL — ABNORMAL HIGH (ref 70–99)

## 2020-06-16 LAB — PHOSPHORUS
Phosphorus: 9.6 mg/dL — ABNORMAL HIGH (ref 2.5–4.6)
Phosphorus: 5.5 mg/dL — ABNORMAL HIGH (ref 2.5–4.6)

## 2020-06-16 MED ORDER — VECURONIUM BROMIDE 10 MG IV SOLR: 0.8000 ug/kg/min | INTRAVENOUS | Status: DC

## 2020-06-16 MED ORDER — VECURONIUM BROMIDE 10 MG IV SOLR
0.0000 ug/kg/min | INTRAVENOUS | Status: DC
  Administered 2020-06-16: 1 ug/kg/min via INTRAVENOUS
  Administered 2020-06-17: 0.7 ug/kg/min via INTRAVENOUS
  Administered 2020-06-19: 0.5 ug/kg/min via INTRAVENOUS
  Administered 2020-06-20: 0.3 ug/kg/min via INTRAVENOUS
  Administered 2020-06-22: 0.6 ug/kg/min via INTRAVENOUS
  Filled 2020-06-16 (×4): qty 100
  Filled 2020-06-16: qty 30

## 2020-06-16 MED ORDER — NOREPINEPHRINE 4 MG/250ML-% IV SOLN
2.0000 ug/min | INTRAVENOUS | Status: DC
  Administered 2020-06-16: 4 ug/min via INTRAVENOUS
  Administered 2020-06-16: 15 ug/min via INTRAVENOUS
  Filled 2020-06-16: qty 250

## 2020-06-16 MED ORDER — FREE WATER
100.0000 mL | Status: DC
  Administered 2020-06-16 – 2020-06-20 (×28): 100 mL

## 2020-06-16 MED ORDER — FENTANYL BOLUS VIA INFUSION
50.0000 ug | INTRAVENOUS | Status: DC | PRN
  Administered 2020-06-20: 50 ug via INTRAVENOUS
  Filled 2020-06-16: qty 50

## 2020-06-16 MED ORDER — NOREPINEPHRINE 16 MG/250ML-% IV SOLN
0.0000 ug/min | INTRAVENOUS | Status: DC
  Administered 2020-06-16 (×2): 20 ug/min via INTRAVENOUS
  Administered 2020-06-17: 2 ug/min via INTRAVENOUS
  Filled 2020-06-16: qty 250

## 2020-06-16 MED ORDER — MIDAZOLAM 50MG/50ML (1MG/ML) PREMIX INFUSION
0.5000 mg/h | INTRAVENOUS | Status: DC
  Administered 2020-06-16: 1 mg/h via INTRAVENOUS
  Administered 2020-06-17 – 2020-06-19 (×4): 3 mg/h via INTRAVENOUS
  Administered 2020-06-20: 4 mg/h via INTRAVENOUS
  Administered 2020-06-20: 3 mg/h via INTRAVENOUS
  Administered 2020-06-21 – 2020-06-22 (×3): 2 mg/h via INTRAVENOUS
  Filled 2020-06-16 (×12): qty 50

## 2020-06-16 MED ORDER — SODIUM ZIRCONIUM CYCLOSILICATE 10 G PO PACK
10.0000 g | PACK | Freq: Once | ORAL | Status: AC
  Administered 2020-06-16: 10 g
  Filled 2020-06-16: qty 1

## 2020-06-16 MED ORDER — SODIUM CHLORIDE 0.9% FLUSH
10.0000 mL | Freq: Two times a day (BID) | INTRAVENOUS | Status: DC
  Administered 2020-06-17 – 2020-06-18 (×2): 10 mL
  Administered 2020-06-19: 40 mL
  Administered 2020-06-19: 10 mL
  Administered 2020-06-20: 40 mL
  Administered 2020-06-20 – 2020-06-22 (×5): 10 mL

## 2020-06-16 MED ORDER — ARTIFICIAL TEARS OPHTHALMIC OINT
1.0000 "application " | TOPICAL_OINTMENT | Freq: Three times a day (TID) | OPHTHALMIC | Status: DC
  Administered 2020-06-16 – 2020-06-23 (×21): 1 via OPHTHALMIC
  Filled 2020-06-16: qty 3.5

## 2020-06-16 MED ORDER — FREE WATER
30.0000 mL | Status: DC
  Administered 2020-06-16 (×2): 30 mL

## 2020-06-16 MED ORDER — CHLORHEXIDINE GLUCONATE 0.12% ORAL RINSE (MEDLINE KIT)
15.0000 mL | Freq: Two times a day (BID) | OROMUCOSAL | Status: DC
  Administered 2020-06-16 – 2020-06-23 (×16): 15 mL via OROMUCOSAL

## 2020-06-16 MED ORDER — VITAL 1.5 CAL PO LIQD
1000.0000 mL | ORAL | Status: DC
  Administered 2020-06-16 (×2): 1000 mL
  Filled 2020-06-16: qty 1000

## 2020-06-16 MED ORDER — NOREPINEPHRINE 4 MG/250ML-% IV SOLN
INTRAVENOUS | Status: AC
  Filled 2020-06-16: qty 250

## 2020-06-16 MED ORDER — INSULIN ASPART 100 UNIT/ML ~~LOC~~ SOLN
0.0000 [IU] | SUBCUTANEOUS | Status: DC
  Administered 2020-06-16: 3 [IU] via SUBCUTANEOUS
  Administered 2020-06-16: 5 [IU] via SUBCUTANEOUS
  Administered 2020-06-16: 11 [IU] via SUBCUTANEOUS
  Administered 2020-06-16: 5 [IU] via SUBCUTANEOUS
  Administered 2020-06-16: 8 [IU] via SUBCUTANEOUS
  Administered 2020-06-16 – 2020-06-17 (×2): 5 [IU] via SUBCUTANEOUS
  Administered 2020-06-17 (×2): 8 [IU] via SUBCUTANEOUS
  Administered 2020-06-17: 11 [IU] via SUBCUTANEOUS
  Administered 2020-06-17: 8 [IU] via SUBCUTANEOUS
  Administered 2020-06-17: 3 [IU] via SUBCUTANEOUS
  Administered 2020-06-18 (×2): 11 [IU] via SUBCUTANEOUS
  Administered 2020-06-18 (×2): 5 [IU] via SUBCUTANEOUS
  Administered 2020-06-18: 8 [IU] via SUBCUTANEOUS
  Administered 2020-06-18 – 2020-06-19 (×5): 5 [IU] via SUBCUTANEOUS
  Administered 2020-06-19: 8 [IU] via SUBCUTANEOUS
  Administered 2020-06-19: 5 [IU] via SUBCUTANEOUS
  Administered 2020-06-20: 2 [IU] via SUBCUTANEOUS
  Administered 2020-06-20: 9 [IU] via SUBCUTANEOUS
  Administered 2020-06-20 (×2): 5 [IU] via SUBCUTANEOUS

## 2020-06-16 MED ORDER — ORAL CARE MOUTH RINSE
15.0000 mL | OROMUCOSAL | Status: DC
  Administered 2020-06-16 – 2020-06-23 (×73): 15 mL via OROMUCOSAL

## 2020-06-16 MED ORDER — SODIUM CHLORIDE 0.9% FLUSH: 10.0000 mL | INTRAVENOUS | Status: DC | PRN

## 2020-06-16 MED ORDER — PROSOURCE TF PO LIQD
45.0000 mL | Freq: Three times a day (TID) | ORAL | Status: DC
  Administered 2020-06-16 – 2020-06-19 (×9): 45 mL
  Filled 2020-06-16 (×9): qty 45

## 2020-06-16 NOTE — Progress Notes (Signed)
Dr. Shearon Stalls was made aware that patient'sBP is low with SBPs in the 70s and DBPs in the 40-50s. MAP in the 50s. Orders for levo given.

## 2020-06-16 NOTE — Progress Notes (Signed)
[facility] Progress Note Patient Name: TERENA BOHAN DOB: 1960-12-24 MRN: [id number]   Date of Service  06/16/2020  HPI/Events of Note  Patient is receiving enteral nutrition at 40 ml / hour, she is not receiving any free water flushes.  eICU Interventions  Free water flushes 30 ml  Q 4 hours via NG tube.        Kerry Kass Mariyam Remington 06/16/2020, 6:14 AM

## 2020-06-16 NOTE — Progress Notes (Signed)
[place] Progress Note Patient Name: Claudia Vega DOB: 08-Oct-1960 MRN: [id number]   Date of Service  06/16/2020  HPI/Events of Note  Patient with hyperglycemia ( BS [phone])  eICU Interventions  CBG with SSI ordered.        Kerry Kass Pretty Weltman 06/16/2020, 12:21 AM

## 2020-06-16 NOTE — Procedures (Signed)
Central Venous Catheter Insertion Procedure Note  Claudia Vega  [id number]  [birth year]  Date:06/16/20  Time:3:32 PM   Provider Performing:Otoniel Myhand Mauricio Po   Procedure: Insertion of Non-tunneled Central Venous [phone]) with US guidance ([id number])   Indication(s) Medication administration  Consent Risks of the procedure as well as the alternatives and risks of each were explained to the patient and/or caregiver.  Consent for the procedure was obtained and is signed in the bedside chart  Anesthesia Topical only with 1% lidocaine   Timeout Verified patient identification, verified procedure, site/side was marked, verified correct patient position, special equipment/implants available, medications/allergies/relevant history reviewed, required imaging and test results available.  Sterile Technique Maximal sterile technique including full sterile barrier drape, hand hygiene, sterile gown, sterile gloves, mask, hair covering, sterile ultrasound probe cover (if used).  Procedure Description Area of catheter insertion was cleaned with chlorhexidine and draped in sterile fashion.  With real-time ultrasound guidance a central venous catheter was placed into the left internal jugular vein. Nonpulsatile blood flow and easy flushing noted in all ports.  The catheter was sutured in place and sterile dressing applied.  Complications/Tolerance None; patient tolerated the procedure well. Chest X-ray is ordered to verify placement for internal jugular or subclavian cannulation.   Chest x-ray is not ordered for femoral cannulation.  EBL Minimal  Specimen(s) None

## 2020-06-16 NOTE — Progress Notes (Signed)
Nutrition Follow-up  DOCUMENTATION CODES:   Not applicable  INTERVENTION:  Initiate Vital 1.5 formula via Cortrak NGT at goal rate of 50 ml/hr.  Provide 45 ml Prosource TF TID per tube.   Tube feeding regimen provides 1920 kcal, 114 grams of protein, and 912 ml water.   NUTRITION DIAGNOSIS:   Increased nutrient needs related to catabolic illness as evidenced by estimated needs; ongoing  GOAL:   Provide needs based on ASPEN/SCCM guidelines; met with TF  MONITOR:   TF tolerance, Skin, Vent status, Weight trends, Labs, I & O's  REASON FOR ASSESSMENT:   LOS    ASSESSMENT:   59 yo female admitted on 9/05 with COVID-19 pneumonia, devloped worsening respiratory failure due to ARDS requiring transfer to ICU on 9/19. PMH includes asthma, migraine, GERD, DM   9/05 Admitted 9/19 ICU transfer  Patient is currently intubated on ventilator support MV: 13.5 L/min Temp (24hrs), Avg:97.9 F (36.6 C), Min:96.7 F (35.9 C), Max:98.3 F (36.8 C)  Pt intubated yesterday. Cortrak NGT placed yesterday. Tip of tube in stomach. Tube feeding initiated per TF protocol with Vital high protein formula at rate of 40. Pt has been tolerating her tube feeds. Per MD, plans to prone pt after central line placement today. RD to modify tube feeding orders to meet new estimated nutrition needs now that pt on ventilatory status. MD to order free water flushes.   Labs and medications reviewed.  Diet Order:   Diet Order            Diet NPO time specified  Diet effective now                 EDUCATION NEEDS:   Not appropriate for education at this time  Skin:  Skin Assessment: Reviewed RN Assessment  Last BM:  9/18  Height:   Ht Readings from Last 1 Encounters:  06/16/20 4' 11"  (1.499 m)    Weight:   Wt Readings from Last 1 Encounters:  06/16/20 78.7 kg    BMI:  Body mass index is 35.04 kg/m.  Estimated Nutritional Needs:   Kcal:  1800-2000  Protein:  100-120 g  Fluid:   >/= 2 L  Corrin Parker, MS, RD, LDN RD pager number/after hours weekend pager number on Amion.

## 2020-06-16 NOTE — Procedures (Addendum)
Chest Tube Insertion Procedure Note2  Indications:  Clinically significant   Pre-operative Diagnosis: left pnx  Post-operative Diagnosis: Same  Procedure Details  Informed consent was obtained for the procedure, including sedation.  Risks of lung perforation, hemorrhage, arrhythmia, and adverse drug reaction were discussed.   After sterile skin prep, using standard technique, a 14 French tube was placed in the left lateral 6th rib space.  Findings: None  Estimated Blood Loss:  Minimal         Specimens:  None              Complications:  None; patient tolerated the procedure well.         Disposition: ICU - intubated and hemodynamically stable.         Condition: stable  Richardson Landry Von Inscoe ACNP Maryanna Shape PCCM Pager [phone] till 3 pm If no answer page [phone] 06/16/2020, 6:04 PM

## 2020-06-16 NOTE — Procedures (Signed)
Arterial Catheter Insertion Procedure Note  TAMARIA DUNLEAVY  [id number]  08/07/1961  Date:06/16/20  Time:12:28 AM    Provider Performing: Ulice Dash    Procedure: Insertion of Arterial Line [phone]) with US guidance ([id number])   Indication(s) Blood pressure monitoring and/or need for frequent ABGs  Consent Unable to obtain consent due to inability to find a medical decision maker for patient.  All reasonable efforts were made.  Another independent medical provider, Dr.Ogan , confirmed the benefits of this procedure outweigh the risks.  Anesthesia None   Time Out Verified patient identification, verified procedure, site/side was marked, verified correct patient position, special equipment/implants available, medications/allergies/relevant history reviewed, required imaging and test results available.   Sterile Technique Maximal sterile technique including full sterile barrier drape, hand hygiene, sterile gown, sterile gloves, mask, hair covering, sterile ultrasound probe cover (if used).   Procedure Description Area of catheter insertion was cleaned with chlorhexidine and draped in sterile fashion. With real-time ultrasound guidance an arterial catheter was placed into the left radial artery.  Appropriate arterial tracings confirmed on monitor.     Complications/Tolerance None; patient tolerated the procedure well.   EBL Minimal   Specimen(s) None

## 2020-06-16 NOTE — Progress Notes (Signed)
Assisted tele visit to patient with family member.  Patrice Matthew Ann, RN  

## 2020-06-16 NOTE — Progress Notes (Signed)
eLink Physician-Brief Progress Note Patient Name: Claudia Vega DOB: [birth year] MRN: [id number]   Date of Service  06/16/2020  HPI/Events of Note  Notified of pneumothorax on CXR Small residual pneumothorax post CT insertion with decrease in subcutaneous air  No hemodynamic changes and no high peak pressure alarms on vent  eICU Interventions  Continue placing on suction -20 mmHg     Intervention Category Intermediate Interventions: Diagnostic test evaluation  Judd Lien 06/16/2020, 8:15 PM

## 2020-06-16 NOTE — Progress Notes (Signed)
[facility] Progress Note Patient Name: Claudia Vega DOB: November 27, 1960 MRN: [id number]   Date of Service  06/16/2020  HPI/Events of Note  ABG reviewed. Persistent hypercapnic respiratory failure despite most recent ventilator adjustments, PH marginally improved. Urinary retention.  eICU Interventions  RT instructed to reduce PEEP to 12 to try to improve ventilation enough to bring [place] closer to 7.2 target. Foley catheter ordered if in  / out catheterization does not resolve urinary retention.        Kerry Kass [place] 06/16/2020, 2:48 AM

## 2020-06-16 NOTE — Progress Notes (Signed)
NAME:  Claudia Vega, MRN:  [id number], DOB:  06-25-1961, LOS: 21 ADMISSION DATE:  05/26/2020, CONSULTATION DATE: September 19 REFERRING MD: Dr. Noah Delaine, CHIEF COMPLAINT: Dyspnea  Brief History   59 year old female admitted for Covid pneumonia on May 27, 2020 treated with remdesivir, Solu-Medrol, baricitinib developed worsening hypoxemia on June 10, 2020 so pulmonary and critical care medicine was consulted.  Has been on CPAP alternating with high flow nasal cannula/nonrebreather mask  Past Medical History  Asthma Migraine GERD  Significant Hospital Events   September 5 admission September 17 epistaxis while on full dose anticoagulation, adjusted back to prophylactic dosing September 19 transfer to ICU  Consults:  Pulmonary and critical care medicine  Procedures:    Significant Diagnostic Tests:  September 15 CT angiogram chest images independently reviewed showing no pulmonary embolism, diffuse bilateral airspace disease  Micro Data:  September 5 SARS-CoV-2 positive September 5 blood culture negative  Antimicrobials:  September 5 remdesivir> September 9 September 5 Solu-Medrol> tapered to prednisone current September 5 baricitinib> September 18  Interim history/subjective:  Intubated yesterday. On maximum ventilator settings with FIO2 100%, her sats are still 80%. Dyssynchronous. Objective   Blood pressure (!) 77/67, pulse (!) 128, temperature 98.3 F (36.8 C), temperature source Oral, resp. rate (!) 35, height 4\' 11"  (1.499 m), weight 78.7 kg, SpO2 (!) 83 %.    Vent Mode: PRVC FiO2 (%):  [100 %] 100 % Set Rate:  [30 bmp-35 bmp] 35 bmp Vt Set:  [260 mL-350 mL] 350 mL PEEP:  [12 [id number] cmH20] 14 cmH20 Plateau Pressure:  [32 cmH20-43 cmH20] 32 cmH20   Intake/Output Summary (Last 24 hours) at 06/16/2020 1355 Last data filed at 06/16/2020 0600 Gross per 24 hour  Intake 626.91 ml  Output 1550 ml  Net -923.09 ml   Filed Weights   06/14/20 0500  06/15/20 0500 06/16/20 0445  Weight: 81.5 kg 81.9 kg 78.7 kg    Examination: General: Middle-aged obese female, on high flow nasal cannula/NRB mask HENT: NCAT OP clear, mucosa is moist PULM: Fine crackles at bases bilaterally, no wheezes or rhonchi CV: RRR, no mgr GI: BS+, soft, nontender MSK: normal bulk and tone Neuro: awake, alert, no distress, MAEW Skin: No rash  Resolved Hospital Problem list   Epistaxis  Assessment & Plan:  Acute hypoxic respiratory failure due to ARDS from COVID-19 pneumonia Intubated 9/24 Maintain LTVV and permissive hypercapnia Solumedrol twice daily, will continue to taper  S/p barcitinib and remdesevir. Worsening acute respiratory acidosis and hypoxemia. . Will need to increase sedation and initiate NMB. P/F ratio<100 will need to start prone positioning.  Will also get central line today prior to prone positioning  Asthma, not in exacerbation Albuterol as needed Dulera  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 Continue Lantus twice daily and NovoLog insulin  HTN On home diltiazem, holding hctz  Acute Urinary retention Insert foley    Best practice:  Diet: regular Pain/Anxiety/Delirium protocol (if indicated): IV Ativan as needed VAP protocol (if indicated): n/a DVT prophylaxis: lovenox GI prophylaxis: n/a Glucose control: Lantus and SSI Mobility: bed rest Code Status: full Family Communication: husband updated today.  Disposition: to ICU  Labs   CBC: Recent Labs  Lab 06/10/20 0251 06/10/20 0251 06/11/20 0419 06/11/20 0419 06/12/20 0230 06/13/20 1144 06/15/20 0337 06/15/20 0919 06/16/20 0746  WBC 19.1*  --  15.2*  --  19.9*  --  15.9*  --  28.9*  NEUTROABS  --   --   --   --   --   --   --   --  27.1*  HGB 14.0   < > 13.9   < > 15.1* 12.9 13.7 13.9 14.2  HCT 42.5   < > 42.8   < > 46.9* 38.0 43.0 41.0 46.7*  MCV 89.1  --  89.4  --  90.5  --  90.1  --  95.9  PLT 453*  --  449*  --  443*  --  302  --  307    < > = values in this interval not displayed.    Basic Metabolic Panel: Recent Labs  Lab 06/10/20 0251 06/10/20 0251 06/11/20 0419 06/11/20 0419 06/12/20 0230 06/13/20 1144 06/15/20 0337 06/15/20 0919 06/15/20 2140 06/16/20 0746  NA 137   < > 137   < > 138 139 139 139  --  143  K 4.3   < > 4.2   < > 4.2 4.4 4.6 4.6  --  5.7*  CL 98  --  100  --  100  --  104  --   --  105  CO2 23  --  25  --  23  --  22  --   --  20*  GLUCOSE 210*  --  207*  --  214*  --  194*  --   --  212*  BUN 39*  --  31*  --  30*  --  34*  --   --  59*  CREATININE 1.00  --  0.88  --  0.87  --  0.82  --   --  1.61*  CALCIUM 8.9  --  9.0  --  9.6  --  9.2  --   --  8.7*  MG  --   --   --   --  2.4  --  2.3  --  3.0* 2.9*  PHOS  --   --   --   --  3.9  --  4.5  --  11.9* 9.6*   < > = values in this interval not displayed.   GFR: Estimated Creatinine Clearance: 33.7 mL/min (A) (by C-G formula based on SCr of 1.61 mg/dL (H)). Recent Labs  Lab 06/10/20 0251 06/10/20 0251 06/11/20 0419 06/12/20 0230 06/15/20 0337 06/16/20 0746  PROCALCITON <0.10  --   --   --   --   --   WBC 19.1*   < > 15.2* 19.9* 15.9* 28.9*   < > = values in this interval not displayed.    Liver Function Tests: Recent Labs  Lab 06/10/20 0251 06/11/20 0419 06/15/20 0337  AST 22 21 24   ALT 24 26 34  ALKPHOS 81 77 86  BILITOT 1.0 0.9 0.8  PROT 6.5 6.5 6.2*  ALBUMIN 2.6* 2.4* 2.4*   No results for input(s): LIPASE, AMYLASE in the last 168 hours. No results for input(s): AMMONIA in the last 168 hours.  ABG    Component Value Date/Time   PHART 7.048 (LL) 06/16/2020 0550   PCO2ART 112 (HH) 06/16/2020 0550   PO2ART 80.0 (L) 06/16/2020 0550   HCO3 29.6 (H) 06/16/2020 0550   TCO2 30 06/15/2020 0919   ACIDBASEDEF 0.4 06/16/2020 0550   O2SAT 93.8 06/16/2020 0550     Coagulation Profile: No results for input(s): INR, PROTIME in the last 168 hours.  Cardiac Enzymes: No results for input(s): CKTOTAL, CKMB, CKMBINDEX,  TROPONINI in the last 168 hours.  HbA1C: Hemoglobin A1C  Date/Time Value Ref Range Status  07/10/2012 06:44 PM 5.8  Final   Hgb A1c MFr  Bld  Date/Time Value Ref Range Status  05/28/2020 04:00 AM 6.9 (H) 4.8 - 5.6 % Final    Comment:    (NOTE) Pre diabetes:          5.7%-6.4%  Diabetes:              >6.4%  Glycemic control for   <7.0% adults with diabetes     CBG: Recent Labs  Lab 06/15/20 1959 06/15/20 2333 06/16/20 0403 06/16/20 0820 06/16/20 1203  GLUCAP 302* 321* 247* 202* 246*    The patient is critically ill with multiple organ systems failure and requires high complexity decision making for assessment and support, frequent evaluation and titration of therapies, application of advanced monitoring technologies and extensive interpretation of multiple databases.   Critical Care Time devoted to patient care services described in this note is 65 minutes. This time reflects time of care of this [place] . This critical care time does not reflect separately billable procedures or procedure time, teaching time or supervisory time of PA/NP/Med student/Med Resident etc but could involve care discussion time.  Leone Haven Pulmonary and Critical Care Medicine 06/16/2020 1:56 PM  Pager: [phone] After hours pager: [phone]

## 2020-06-17 ENCOUNTER — Inpatient Hospital Stay (HOSPITAL_COMMUNITY): Payer: 59

## 2020-06-17 DIAGNOSIS — N179 Acute kidney failure, unspecified: Secondary | ICD-10-CM

## 2020-06-17 DIAGNOSIS — J80 Acute respiratory distress syndrome: Secondary | ICD-10-CM

## 2020-06-17 DIAGNOSIS — R739 Hyperglycemia, unspecified: Secondary | ICD-10-CM

## 2020-06-17 DIAGNOSIS — J95811 Postprocedural pneumothorax: Secondary | ICD-10-CM

## 2020-06-17 LAB — CBC
HCT: 40.5 % (ref 36.0–46.0)
Hemoglobin: 12.2 g/dL (ref 12.0–15.0)
MCH: 29 pg (ref 26.0–34.0)
MCHC: 30.1 g/dL (ref 30.0–36.0)
MCV: 96.4 fL (ref 80.0–100.0)
Platelets: 160 10*3/uL (ref 150–400)
RBC: 4.2 MIL/uL (ref 3.87–5.11)
RDW: 13.2 % (ref 11.5–15.5)
WBC: 18.7 10*3/uL — ABNORMAL HIGH (ref 4.0–10.5)
nRBC: 0 % (ref 0.0–0.2)

## 2020-06-17 LAB — POCT I-STAT 7, (LYTES, BLD GAS, ICA,H+H)
Acid-base deficit: 2 mmol/L (ref 0.0–2.0)
Acid-base deficit: 3 mmol/L — ABNORMAL HIGH (ref 0.0–2.0)
Bicarbonate: 27.2 mmol/L (ref 20.0–28.0)
Bicarbonate: 25.4 mmol/L (ref 20.0–28.0)
Calcium, Ion: 1.22 mmol/L (ref 1.15–1.40)
Calcium, Ion: 1.26 mmol/L (ref 1.15–1.40)
HCT: 34 % — ABNORMAL LOW (ref 36.0–46.0)
HCT: 32 % — ABNORMAL LOW (ref 36.0–46.0)
Hemoglobin: 11.6 g/dL — ABNORMAL LOW (ref 12.0–15.0)
Hemoglobin: 10.9 g/dL — ABNORMAL LOW (ref 12.0–15.0)
O2 Saturation: 93 %
O2 Saturation: 91 %
Patient temperature: 99
Potassium: 4.6 mmol/L (ref 3.5–5.1)
Potassium: 4.6 mmol/L (ref 3.5–5.1)
Sodium: 142 mmol/L (ref 135–145)
Sodium: 141 mmol/L (ref 135–145)
TCO2: 29 mmol/L (ref 22–32)
TCO2: 27 mmol/L (ref 22–32)
pCO2 arterial: 71 mmHg (ref 32.0–48.0)
pCO2 arterial: 63.9 mmHg — ABNORMAL HIGH (ref 32.0–48.0)
pH, Arterial: 7.192 — CL (ref 7.350–7.450)
pH, Arterial: 7.208 — ABNORMAL LOW (ref 7.350–7.450)
pO2, Arterial: 88 mmHg (ref 83.0–108.0)
pO2, Arterial: 76 mmHg — ABNORMAL LOW (ref 83.0–108.0)

## 2020-06-17 LAB — PHOSPHORUS: Phosphorus: 6.5 mg/dL — ABNORMAL HIGH (ref 2.5–4.6)

## 2020-06-17 LAB — BASIC METABOLIC PANEL
Anion gap: 13 (ref 5–15)
BUN: 105 mg/dL — ABNORMAL HIGH (ref 6–20)
CO2: 24 mmol/L (ref 22–32)
Calcium: 8.7 mg/dL — ABNORMAL LOW (ref 8.9–10.3)
Chloride: 107 mmol/L (ref 98–111)
Creatinine, Ser: 2.4 mg/dL — ABNORMAL HIGH (ref 0.44–1.00)
GFR calc Af Amer: 25 mL/min — ABNORMAL LOW (ref >60)
GFR calc non Af Amer: 21 mL/min — ABNORMAL LOW (ref >60)
Glucose, Bld: 214 mg/dL — ABNORMAL HIGH (ref 70–99)
Potassium: 4.7 mmol/L (ref 3.5–5.1)
Sodium: 144 mmol/L (ref 135–145)

## 2020-06-17 LAB — GLUCOSE, CAPILLARY
Glucose-Capillary: 182 mg/dL — ABNORMAL HIGH (ref 70–99)
Glucose-Capillary: 207 mg/dL — ABNORMAL HIGH (ref 70–99)
Glucose-Capillary: 290 mg/dL — ABNORMAL HIGH (ref 70–99)
Glucose-Capillary: 295 mg/dL — ABNORMAL HIGH (ref 70–99)
Glucose-Capillary: 328 mg/dL — ABNORMAL HIGH (ref 70–99)
Glucose-Capillary: 335 mg/dL — ABNORMAL HIGH (ref 70–99)

## 2020-06-17 LAB — MAGNESIUM: Magnesium: 3.3 mg/dL — ABNORMAL HIGH (ref 1.7–2.4)

## 2020-06-17 MED ORDER — INSULIN GLARGINE 100 UNIT/ML ~~LOC~~ SOLN
10.0000 [IU] | Freq: Two times a day (BID) | SUBCUTANEOUS | Status: DC
  Administered 2020-06-17 – 2020-06-19 (×4): 10 [IU] via SUBCUTANEOUS
  Filled 2020-06-17 (×5): qty 0.1

## 2020-06-17 MED ORDER — LACTULOSE 10 GM/15ML PO SOLN
20.0000 g | Freq: Every day | ORAL | Status: DC
  Administered 2020-06-17 – 2020-06-20 (×4): 20 g
  Filled 2020-06-17 (×5): qty 30

## 2020-06-17 MED ORDER — ENOXAPARIN SODIUM 30 MG/0.3ML ~~LOC~~ SOLN
30.0000 mg | SUBCUTANEOUS | Status: DC
  Administered 2020-06-18 – 2020-06-20 (×3): 30 mg via SUBCUTANEOUS
  Filled 2020-06-17 (×3): qty 0.3

## 2020-06-17 MED ORDER — IPRATROPIUM-ALBUTEROL 0.5-2.5 (3) MG/3ML IN SOLN: 3.0000 mL | Freq: Four times a day (QID) | RESPIRATORY_TRACT | Status: DC | PRN

## 2020-06-17 MED ORDER — FAMOTIDINE IN NACL 20-0.9 MG/50ML-% IV SOLN
20.0000 mg | INTRAVENOUS | Status: DC
  Administered 2020-06-18 – 2020-06-22 (×5): 20 mg via INTRAVENOUS
  Filled 2020-06-17 (×5): qty 50

## 2020-06-17 NOTE — Procedures (Signed)
Insertion of Chest Tube Procedure Note  LURA FALOR  [id number]  1961/01/23  Date:06/17/20  Time:5:02 PM    Provider Performing: Spero Geralds   Procedure: Chest Tube Insertion [phone])  Indication(s) Pneumothorax  Consent Risks of the procedure as well as the alternatives and risks of each were explained to the patient and/or caregiver.  Consent for the procedure was obtained and is signed in the bedside chart  Anesthesia Topical only with 1% lidocaine    Time Out Verified patient identification, verified procedure, site/side was marked, verified correct patient position, special equipment/implants available, medications/allergies/relevant history reviewed, required imaging and test results available.   Sterile Technique Maximal sterile technique including full sterile barrier drape, hand hygiene, sterile gown, sterile gloves, mask, hair covering, sterile ultrasound probe cover (if used).   Procedure Description Ultrasound not used to identify appropriate pleural anatomy for placement and overlying skin marked. Area of placement cleaned and draped in sterile fashion.  A 25F French pigtail pleural catheter was placed into the left anterior pleural space using Seldinger technique. Appropriate return of air was obtained.  The tube was connected to atrium and placed on -20 cm H2O wall suction.   Complications/Tolerance None; patient tolerated the procedure well. Chest X-ray is ordered to verify placement.   EBL Minimal  Specimen(s) none   Lenice Llamas, MD Pulmonary and [place] Pager: [phone] Office:[phone]

## 2020-06-17 NOTE — Progress Notes (Signed)
Pt supined at this time with no complications. Et secured in proper position with commercial tube holder. No break down noted on skin. ABG to obtained upon protocol. VS within normal limits.

## 2020-06-17 NOTE — Progress Notes (Addendum)
NAME:  Claudia Vega, MRN:  [id number], DOB:  [birth year], LOS: 21 ADMISSION DATE:  06/08/2020, CONSULTATION DATE: September 19 REFERRING MD: Dr. Noah Delaine, CHIEF COMPLAINT: Dyspnea  Brief History   59 year old female admitted for Covid pneumonia on May 27, 2020 treated with remdesivir, Solu-Medrol, baricitinib developed worsening hypoxemia on June 10, 2020 so pulmonary and critical care medicine was consulted.  Has been on CPAP alternating with high flow nasal cannula/nonrebreather mask  Past Medical History  Asthma Migraine GERD  Significant Hospital Events   September 5 admission September 17 epistaxis while on full dose anticoagulation, adjusted back to prophylactic dosing September 19 transfer to ICU  Consults:  Pulmonary and critical care medicine  Procedures:    Significant Diagnostic Tests:  September 15 CT angiogram chest images independently reviewed showing no pulmonary embolism, diffuse bilateral airspace disease  Micro Data:  September 5 SARS-CoV-2 positive September 5 blood culture negative  Antimicrobials:  September 5 remdesivir> September 9 September 5 Solu-Medrol> tapered to prednisone current September 5 baricitinib> September 18  Interim history/subjective:  proned and paralyzed. No BM in a week per RN. Vasopressor requirements coming down. Objective   Blood pressure (!) 77/67, pulse (!) 120, temperature 99.2 F (37.3 C), temperature source Oral, resp. rate (!) 35, height 4\' 11"  (1.499 m), weight 77.4 kg, SpO2 98 %.    Vent Mode: PRVC FiO2 (%):  [80 %-100 %] 80 % Set Rate:  [35 bmp] 35 bmp Vt Set:  [350 mL] 350 mL PEEP:  [12 cmH20-16 cmH20] 12 cmH20 Plateau Pressure:  [29 cmH20-40 cmH20] 29 cmH20   Intake/Output Summary (Last 24 hours) at 06/17/2020 1047 Last data filed at 06/17/2020 0600 Gross per 24 hour  Intake 3002.68 ml  Output 310 ml  Net 2692.68 ml   Filed Weights   06/15/20 0500 06/16/20 0445 06/17/20 0309  Weight: 81.9 kg  78.7 kg 77.4 kg    Examination: General: Middle-aged obese female, intubated, proned HENT: NCAT OP clear, mucosa is moist PULM: Fine crackles at bases bilaterally, no wheezes or rhonchi, left sided lateral chest tube with small air leak CV: RRR, no mgr GI: BS+, soft, nontender MSK: normal bulk and tone Neuro: intubated, sedated, not responsive Skin: No rash  Resolved Hospital Problem list   Epistaxis  Assessment & Plan:  Acute hypoxic respiratory failure due to ARDS from COVID-19 pneumonia Intubated 9/24 Maintain LTVV and permissive hypercapnia Solumedrol twice daily, will continue to taper  S/p barcitinib and remdesevir. Initiated NMB and prone positioning on 9/25. Versed and fentanyl for sedation. Repeat ABG today for monitoring respiratory acidosis  Left Apical Pneumothorax Chest tube placed yesterday with sub-optimal resolution Will plan for apical left sided chest tube today  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 Continue Lantus twice daily and NovoLog insulin Increase lantus dosing today to 10 bid  HTN On home diltiazem, holding hctz  Acute Urinary retention with oliguric AKI Maintain foley  Renal protective measures Concern for ATN in the setting of shock. May need RRT  Circulatory vs Septic Shock - vasopressor requirements decreasing, taper as tolerated MAP>65   Best practice:  Diet: tube feeds Pain/Anxiety/Delirium protocol (if indicated): deep sedation as above VAP protocol (if indicated): n/a DVT prophylaxis: lovenox GI prophylaxis: n/a Glucose control: Lantus and SSI Mobility: bed rest Code Status: full Family Communication: husband updated 9/25 at bedside.  Disposition: to ICU  Labs   CBC: Recent Labs  Lab 06/11/20 0419 06/11/20 0419 06/12/20 0230 06/12/20 0230 06/13/20 1144 06/15/20 4709 06/15/20  5638 06/16/20 0746 06/17/20 0745  WBC 15.2*  --  19.9*  --   --  15.9*  --  28.9* 18.7*  NEUTROABS  --   --   --    --   --   --   --  27.1*  --   HGB 13.9   < > 15.1*   < > 12.9 13.7 13.9 14.2 12.2  HCT 42.8   < > 46.9*   < > 38.0 43.0 41.0 46.7* 40.5  MCV 89.4  --  90.5  --   --  90.1  --  95.9 96.4  PLT 449*  --  443*  --   --  302  --  307 160   < > = values in this interval not displayed.    Basic Metabolic Panel: Recent Labs  Lab 06/11/20 0419 06/11/20 0419 06/12/20 0230 06/12/20 0230 06/13/20 1144 06/15/20 0337 06/15/20 0919 06/15/20 2140 06/16/20 0746 06/16/20 1840 06/17/20 0308 06/17/20 0745  NA 137   < > 138   < > 139 139 139  --  143  --   --  144  K 4.2   < > 4.2   < > 4.4 4.6 4.6  --  5.7*  --   --  4.7  CL 100  --  100  --   --  104  --   --  105  --   --  107  CO2 25  --  23  --   --  22  --   --  20*  --   --  24  GLUCOSE 207*  --  214*  --   --  194*  --   --  212*  --   --  214*  BUN 31*  --  30*  --   --  34*  --   --  59*  --   --  105*  CREATININE 0.88  --  0.87  --   --  0.82  --   --  1.61*  --   --  2.40*  CALCIUM 9.0  --  9.6  --   --  9.2  --   --  8.7*  --   --  8.7*  MG  --   --  2.4   < >  --  2.3  --  3.0* 2.9* 2.7* 3.3*  --   PHOS  --   --  3.9   < >  --  4.5  --  11.9* 9.6* 5.5* 6.5*  --    < > = values in this interval not displayed.   GFR: Estimated Creatinine Clearance: 22.4 mL/min (A) (by C-G formula based on SCr of 2.4 mg/dL (H)). Recent Labs  Lab 06/12/20 0230 06/15/20 0337 06/16/20 0746 06/17/20 0745  WBC 19.9* 15.9* 28.9* 18.7*    Liver Function Tests: Recent Labs  Lab 06/11/20 0419 06/15/20 0337  AST 21 24  ALT 26 34  ALKPHOS 77 86  BILITOT 0.9 0.8  PROT 6.5 6.2*  ALBUMIN 2.4* 2.4*   No results for input(s): LIPASE, AMYLASE in the last 168 hours. No results for input(s): AMMONIA in the last 168 hours.  ABG    Component Value Date/Time   PHART 7.048 (LL) 06/16/2020 0550   PCO2ART 112 (HH) 06/16/2020 0550   PO2ART 80.0 (L) 06/16/2020 0550   HCO3 29.6 (H) 06/16/2020 0550   TCO2 30 06/15/2020 0919   ACIDBASEDEF 0.4 06/16/2020  0550   O2SAT 93.8 06/16/2020 0550     Coagulation Profile: No results for input(s): INR, PROTIME in the last 168 hours.  Cardiac Enzymes: No results for input(s): CKTOTAL, CKMB, CKMBINDEX, TROPONINI in the last 168 hours.  HbA1C: Hemoglobin A1C  Date/Time Value Ref Range Status  07/10/2012 06:44 PM 5.8  Final   Hgb A1c MFr Bld  Date/Time Value Ref Range Status  05/28/2020 04:00 AM 6.9 (H) 4.8 - 5.6 % Final    Comment:    (NOTE) Pre diabetes:          5.7%-6.4%  Diabetes:              >6.4%  Glycemic control for   <7.0% adults with diabetes     CBG: Recent Labs  Lab 06/16/20 1628 06/16/20 1949 06/16/20 2341 06/17/20 0339 06/17/20 0744  GLUCAP 195* 264* 255* 207* 182*    The patient is critically ill with multiple organ systems failure and requires high complexity decision making for assessment and support, frequent evaluation and titration of therapies, application of advanced monitoring technologies and extensive interpretation of multiple databases.   Critical Care Time devoted to patient care services described in this note is 42 minutes. This time reflects time of care of this [place] . This critical care time does not reflect separately billable procedures or procedure time, teaching time or supervisory time of PA/NP/Med student/Med Resident etc but could involve care discussion time.  Leone Haven Pulmonary and Critical Care Medicine 06/17/2020 10:47 AM  Pager: [phone] After hours pager: [phone]

## 2020-06-17 NOTE — Progress Notes (Signed)
Orders to remove L lateral chest tube. Sutures present only on one side of site. Sutures removed prior to d/cing chest tube. L lateral chest tube removed per MD order. Vasoline gauze/4x4/tape placed to site.

## 2020-06-17 NOTE — Progress Notes (Signed)
Scr increased to 2.4>>CrCl less than 30 ml/min. Reduce lovenox to 30mg  SQ qday.  Onnie Boer, PharmD, BCIDP, AAHIVP, CPP Infectious Disease Pharmacist 06/17/2020 1:53 PM

## 2020-06-17 NOTE — Progress Notes (Signed)
Patient's head and arms repositioned at this time without complications.

## 2020-06-17 NOTE — Progress Notes (Signed)
Patient's ETT still secured in proper place with cloth tape. Patient placed in prone position. No complications.

## 2020-06-17 NOTE — Progress Notes (Signed)
Patient head & arms repositioned now lying right side face down.

## 2020-06-17 NOTE — Progress Notes (Signed)
Assisted tele visit to patient with family member.  Aliyha Fornes Ann, RN  

## 2020-06-18 LAB — URINALYSIS, ROUTINE W REFLEX MICROSCOPIC
Bilirubin Urine: NEGATIVE
Glucose, UA: >=500 mg/dL — AB
Ketones, ur: NEGATIVE mg/dL
Nitrite: NEGATIVE
Protein, ur: NEGATIVE mg/dL
Specific Gravity, Urine: 1.011 (ref 1.005–1.030)
pH: 5 (ref 5.0–8.0)

## 2020-06-18 LAB — BASIC METABOLIC PANEL
Anion gap: 9 (ref 5–15)
BUN: 129 mg/dL — ABNORMAL HIGH (ref 6–20)
CO2: 25 mmol/L (ref 22–32)
Calcium: 8.7 mg/dL — ABNORMAL LOW (ref 8.9–10.3)
Chloride: 107 mmol/L (ref 98–111)
Creatinine, Ser: 2.75 mg/dL — ABNORMAL HIGH (ref 0.44–1.00)
GFR calc Af Amer: 21 mL/min — ABNORMAL LOW (ref >60)
GFR calc non Af Amer: 18 mL/min — ABNORMAL LOW (ref >60)
Glucose, Bld: 263 mg/dL — ABNORMAL HIGH (ref 70–99)
Potassium: 5.2 mmol/L — ABNORMAL HIGH (ref 3.5–5.1)
Sodium: 141 mmol/L (ref 135–145)

## 2020-06-18 LAB — CBC
HCT: 38.3 % (ref 36.0–46.0)
Hemoglobin: 11.5 g/dL — ABNORMAL LOW (ref 12.0–15.0)
MCH: 28.9 pg (ref 26.0–34.0)
MCHC: 30 g/dL (ref 30.0–36.0)
MCV: 96.2 fL (ref 80.0–100.0)
Platelets: 150 10*3/uL (ref 150–400)
RBC: 3.98 MIL/uL (ref 3.87–5.11)
RDW: 13 % (ref 11.5–15.5)
WBC: 14.8 10*3/uL — ABNORMAL HIGH (ref 4.0–10.5)
nRBC: 0.1 % (ref 0.0–0.2)

## 2020-06-18 LAB — POCT I-STAT 7, (LYTES, BLD GAS, ICA,H+H)
Acid-base deficit: 3 mmol/L — ABNORMAL HIGH (ref 0.0–2.0)
Bicarbonate: 26.3 mmol/L (ref 20.0–28.0)
Calcium, Ion: 1.3 mmol/L (ref 1.15–1.40)
HCT: 30 % — ABNORMAL LOW (ref 36.0–46.0)
Hemoglobin: 10.2 g/dL — ABNORMAL LOW (ref 12.0–15.0)
O2 Saturation: 82 %
Patient temperature: 96.9
Potassium: 5.5 mmol/L — ABNORMAL HIGH (ref 3.5–5.1)
Sodium: 139 mmol/L (ref 135–145)
TCO2: 29 mmol/L (ref 22–32)
pCO2 arterial: 70.4 mmHg (ref 32.0–48.0)
pH, Arterial: 7.176 — CL (ref 7.350–7.450)
pO2, Arterial: 57 mmHg — ABNORMAL LOW (ref 83.0–108.0)

## 2020-06-18 LAB — GLUCOSE, CAPILLARY
Glucose-Capillary: 202 mg/dL — ABNORMAL HIGH (ref 70–99)
Glucose-Capillary: 212 mg/dL — ABNORMAL HIGH (ref 70–99)
Glucose-Capillary: 226 mg/dL — ABNORMAL HIGH (ref 70–99)
Glucose-Capillary: 233 mg/dL — ABNORMAL HIGH (ref 70–99)
Glucose-Capillary: 293 mg/dL — ABNORMAL HIGH (ref 70–99)
Glucose-Capillary: 324 mg/dL — ABNORMAL HIGH (ref 70–99)

## 2020-06-18 LAB — SODIUM, URINE, RANDOM: Sodium, Ur: 40 mmol/L

## 2020-06-18 LAB — CREATININE, URINE, RANDOM: Creatinine, Urine: 79.38 mg/dL

## 2020-06-18 LAB — POTASSIUM: Potassium: 5.5 mmol/L — ABNORMAL HIGH (ref 3.5–5.1)

## 2020-06-18 MED ORDER — INSULIN ASPART 100 UNIT/ML ~~LOC~~ SOLN: 4.0000 [IU] | SUBCUTANEOUS | Status: DC

## 2020-06-18 MED ORDER — NOREPINEPHRINE 16 MG/250ML-% IV SOLN
0.0000 ug/min | INTRAVENOUS | Status: DC
  Administered 2020-06-18: 0 ug/min via INTRAVENOUS
  Administered 2020-06-19 (×2): 2 ug/min via INTRAVENOUS
  Administered 2020-06-20: 3 ug/min via INTRAVENOUS
  Administered 2020-06-22: 12 ug/min via INTRAVENOUS
  Administered 2020-06-22: 26 ug/min via INTRAVENOUS
  Administered 2020-06-23 (×2): 80 ug/min via INTRAVENOUS
  Filled 2020-06-18 (×5): qty 250

## 2020-06-18 MED ORDER — METOCLOPRAMIDE HCL 5 MG/ML IJ SOLN
5.0000 mg | Freq: Three times a day (TID) | INTRAMUSCULAR | Status: DC
  Administered 2020-06-18 – 2020-06-21 (×9): 5 mg via INTRAVENOUS
  Filled 2020-06-18 (×9): qty 2

## 2020-06-18 MED ORDER — FUROSEMIDE 10 MG/ML IJ SOLN
80.0000 mg | Freq: Once | INTRAMUSCULAR | Status: AC
  Administered 2020-06-18: 80 mg via INTRAVENOUS
  Filled 2020-06-18: qty 8

## 2020-06-18 MED ORDER — INSULIN ASPART 100 UNIT/ML ~~LOC~~ SOLN
6.0000 [IU] | SUBCUTANEOUS | Status: DC
  Administered 2020-06-18 – 2020-06-21 (×17): 6 [IU] via SUBCUTANEOUS

## 2020-06-18 MED ORDER — SODIUM ZIRCONIUM CYCLOSILICATE 10 G PO PACK
10.0000 g | PACK | Freq: Once | ORAL | Status: AC
  Administered 2020-06-18: 10 g
  Filled 2020-06-18: qty 1

## 2020-06-18 MED ORDER — DILTIAZEM 12 MG/ML ORAL SUSPENSION
30.0000 mg | Freq: Four times a day (QID) | ORAL | Status: DC
  Administered 2020-06-18 – 2020-06-22 (×12): 30 mg
  Filled 2020-06-18 (×16): qty 3

## 2020-06-18 MED ORDER — VITAL 1.5 CAL PO LIQD
1000.0000 mL | ORAL | Status: DC
  Administered 2020-06-18: 1000 mL

## 2020-06-18 NOTE — Progress Notes (Signed)
OT Cancellation Note  Patient Details Name: Claudia Vega MRN: [id number] DOB: 18-May-1961   Cancelled Treatment:    Reason Eval/Treat Not Completed: Patient not medically ready (Pt has since been intubated with high ventilator settings, paralyzed and sedated. OT will hold for change of status, otherwise will sign off and await re order if appropriate.)   Zenovia Jarred, MSOT, OTR/L [facility] Office Number: [phone] Pager: [phone]  Zenovia Jarred 06/18/2020, 2:25 PM

## 2020-06-18 NOTE — Progress Notes (Signed)
[facility] Progress Note Patient Name: Claudia Vega DOB: 14-Sep-1961 MRN: [id number]   Date of Service  06/18/2020  HPI/Events of Note  A-line lost. Nursing request to place another A-line.   eICU Interventions  Plan: 1. RT to place another A-line.      Intervention Category Major Interventions: Other:  Lysle Dingwall 06/18/2020, 9:27 PM

## 2020-06-18 NOTE — Progress Notes (Signed)
Assisted tele visit to patient with family member.  Aydan Phoenix Ann, RN  

## 2020-06-18 NOTE — Progress Notes (Signed)
Patient's tube secured in the proper position with cloth tape. No break down noted. Patient placed in prone position without complications.

## 2020-06-18 NOTE — Progress Notes (Signed)
Nutrition Follow-up  DOCUMENTATION CODES:   Not applicable  INTERVENTION:   Vital 1.5 @ 20 ml/hr (trickle)  As pt able to tolerate recommend increase to goal rate Vital 1.5 @ 55 ml/hr (1320 ml/day) 90 ml ProSource TID Provides: 2280 kcal, 130 grams protein, and 1003 ml free water.   100 ml free water every 3 hours Total free water: 1803 ml    NUTRITION DIAGNOSIS:   Increased nutrient needs related to catabolic illness as evidenced by estimated needs.  Ongoing.   GOAL:   Provide needs based on ASPEN/SCCM guidelines  Not met.   MONITOR:   TF tolerance, Skin, Vent status, Weight trends, Labs, I & O's  REASON FOR ASSESSMENT:   LOS    ASSESSMENT:   59 yo female admitted on 9/05 with COVID-19 pneumonia, devloped worsening respiratory failure due to ARDS requiring transfer to ICU on 9/19. PMH includes asthma, migraine, GERD, DM  Pt with multiple episodes of vomiting while prone. Spoke with NP, will start Reglan today and re-start trickle TF for now. Renal consulted, likely to start CRRT. Pt with heavy sedation and paralyzed. Off pressors.  RD spoke with NP, starting reglan due to vomiting episodes. Re-start TF at trickle and monitor for tolerance.    9/5 admitted; pt treated for bronchitis x 2 weeks PTA 9/17 epistaxis 9/19 tx to ICU due to worsening hypoxemia  9/24 intubated for severe ARDS; Cortrak placed - tip gastric 9/25 prone/paralyzed 9/26 chest tube placed 9/27 pt with multiple episodes of vomiting; attempted to advance cortrak tube however remains gastric.   Patient is currently intubated on ventilator support MV: 12.5 L/min Temp (24hrs), Avg:97.6 F (36.4 C), Min:96.9 F (36.1 C), Max:98.3 F (36.8 C)   Medications reviewed and include: 500 mg vitamin C daily, SSI, 6 units novolog every 4 hours, 10 units lantus BID, lactulose daily, tradjenta, solumedrol, MVI with minerals, miralax, senokot-s, 220 mg zinc daily  Fentanyl Versed Vecuronium  Labs  reviewed: K+ 5.2, BUN: 129, Cr: 2.75 CBG's: 226-212-202    NUTRITION - FOCUSED PHYSICAL EXAM:  WNL 9/22  Diet Order:   Diet Order            Diet NPO time specified  Diet effective now                 EDUCATION NEEDS:   Not appropriate for education at this time  Skin:  Skin Assessment: Reviewed RN Assessment  Last BM:  9/27  Height:   Ht Readings from Last 1 Encounters:  06/18/20 4' 11"  (1.499 m)    Weight:   Wt Readings from Last 1 Encounters:  06/18/20 80.5 kg    Ideal Body Weight:     BMI:  Body mass index is 35.84 kg/m.  Estimated Nutritional Needs:   Kcal:  2100-2400  Protein:  120-194 grams  Fluid:  >/= 2 L  Kierah Goatley P., RD, LDN, CNSC See AMiON for contact information

## 2020-06-18 NOTE — Progress Notes (Signed)
Patient placed in prone position with head turned to the left. Patient had a desaturation episode to 80, but is now in the mid 80s.

## 2020-06-18 NOTE — Procedures (Signed)
Arterial Catheter Insertion Procedure Note  Claudia Vega  [id number]  11-04-1960  Date:06/18/20  Time:11:31 PM    Provider Performing: Lynann Bologna    Procedure: Insertion of Arterial Line [phone]) without US guidance  Indication(s) Blood pressure monitoring and/or need for frequent ABGs  Consent Unable to obtain consent due to emergent nature of procedure.  Anesthesia None   Time Out Verified patient identification, verified procedure, site/side was marked, verified correct patient position, special equipment/implants available, medications/allergies/relevant history reviewed, required imaging and test results available.   Sterile Technique Maximal sterile technique including full sterile barrier drape, hand hygiene, sterile gown, sterile gloves, mask, hair covering, sterile ultrasound probe cover (if used).   Procedure Description Area of catheter insertion was cleaned with chlorhexidine and draped in sterile fashion. Without real-time ultrasound guidance an arterial catheter was placed into the left radial artery.  Appropriate arterial tracings confirmed on monitor.     Complications/Tolerance None; patient tolerated the procedure well.   EBL Minimal   Specimen(s) None

## 2020-06-18 NOTE — Progress Notes (Signed)
Assisted tele visit to patient with family member.  Jillene Wehrenberg Ann, RN  

## 2020-06-18 NOTE — Progress Notes (Signed)
Patient placed in supine position by RT x 1, RN x 4 without complications.  Patient turned supine early to verify location of core track.  ETT re-secured with a commercial tube holder at 23cm in the center.

## 2020-06-18 NOTE — Progress Notes (Signed)
Patient's head and arms repositioned at this time. Patient's head turn to the right. Patient tolerated well.

## 2020-06-18 NOTE — Progress Notes (Signed)
Cortrak Tube Team Note:  Patient had vomiting episode this am while proned. RN requested tube to be retraced due to concern for malpositioning. RD attempted to advance tube post pyloric without success. Per Cortrak monitor, tube tip lies at the pylorus. If intolerance persist, may need assistance from diagnostic radiology. Unit RD and RN aware.   Existing tube secured in L nare at 70 cm marking with existing bridle.   Mariana Single RD, LDN Clinical Nutrition Pager listed in [place]

## 2020-06-18 NOTE — Plan of Care (Signed)
  Problem: Clinical Measurements: Goal: Ability to maintain clinical measurements within normal limits will improve Outcome: Progressing   Problem: Pain Managment: Goal: General experience of comfort will improve Outcome: Progressing   Problem: Clinical Measurements: Goal: Diagnostic test results will improve Outcome: Not Progressing   Problem: Elimination: Goal: Will not experience complications related to bowel motility Outcome: Not Progressing

## 2020-06-18 NOTE — Progress Notes (Signed)
Pt's head turned at this time w/o event.

## 2020-06-18 NOTE — Progress Notes (Signed)
PCCM progress note  Called and spoke to husband Dima Mini over the phone morning of 9/27 for update.  He was updated on her worsening renal function and plan to consult nephrology.  He was updated regarding likely need for renal support including possible CRRT and is agreeable to this plan if it will "help save her life".  All other questions were answered.  Johnsie Cancel, NP-C [facility] Contact / Pager information can be found on Amion  06/18/2020, 11:15 AM

## 2020-06-18 NOTE — Progress Notes (Signed)
NAME:  Claudia Vega, MRN:  [id number], DOB:  August 30, 1961, LOS: 53 ADMISSION DATE:  06/17/2020, CONSULTATION DATE: September 19 REFERRING MD: Dr. Noah Delaine, CHIEF COMPLAINT: Dyspnea  Brief History   59 year old female admitted for Covid pneumonia on May 27, 2020 treated with remdesivir, Solu-Medrol, baricitinib developed worsening hypoxemia on June 10, 2020 so pulmonary and critical care medicine was consulted.  Has been on CPAP alternating with high flow nasal cannula/nonrebreather mask  Past Medical History  Asthma Migraine GERD  Significant Hospital Events   9/5 admission 9/17 epistaxis while on full dose anticoagulation, adjusted back to prophylactic dosing 9/19 transfer to ICU 9/24 intubated  Consults:  PCCM  Procedures:  ETT 9/24 > Left A-line 9/24 > Left IJ 9/25 > Left chest tube 9/26  Significant Diagnostic Tests:  9/15 CTA chest images independently reviewed showing no pulmonary embolism, diffuse bilateral airspace disease  Micro Data:  9/5 SARS-CoV-2 positive 9/5 blood culture negative  Antimicrobials:  9/5 remdesivir> 9/9 9/5 Solu-Medrol> tapered to prednisone current 9/5 baricitinib> 9/18  Interim history/subjective:  Proned sedated and paralyzed Renal function continues to worsen with decreasing urine output Currently net +7 L  Objective   Blood pressure 140/63, pulse (!) 105, temperature (!) 96.9 F (36.1 C), temperature source Axillary, resp. rate (!) 35, height 4' 11"  (1.499 m), weight 80.5 kg, SpO2 100 %.    Vent Mode: PRVC FiO2 (%):  [70 %-100 %] 90 % Set Rate:  [35 bmp] 35 bmp Vt Set:  [350 mL] 350 mL PEEP:  [10 cmH20-14 cmH20] 12 cmH20 Plateau Pressure:  [27 cmH20-32 cmH20] 32 cmH20   Intake/Output Summary (Last 24 hours) at 06/18/2020 1030 Last data filed at 06/18/2020 3546 Gross per 24 hour  Intake 2406.64 ml  Output 242 ml  Net 2164.64 ml   Filed Weights   06/16/20 0445 06/17/20 0309 06/18/20 0331  Weight: 78.7 kg 77.4  kg 80.5 kg    Examination: General: Acute on chronic ill-appearing middle-aged female prolonged all deeply sedated and paralyzed HEENT: ETT, MM pink/moist, PERRL,  Neuro: Deeply sedated and paralyzed CV: s1s2 regular rate and rhythm, no murmur, rubs, or gallops,  PULM: Mechanical posterior breath sounds, tolerating ventilator, no added breath sounds GI: soft, bowel sounds active in all 4 quadrants, non-tender, non-distended, tolerating TF Extremities: warm/dry, nonpitting lower extremity edema  Skin: no rashes or lesions  Resolved Hospital Problem list   Epistaxis Circulatory vs Septic Shock - vasopressor stopped 9/25  Assessment & Plan:   Acute hypoxic/hypercapnic respiratory failure and sepsis (POA) due to severe ARDS from COVID19 pneumonia -Continues to met criteria for sever ARDS with P/F ratio of 84 (mortality of 45%) despite prone positioning and heavy sedation including continuous NMB -Completed baricitinib 9/18   Acute respiratory acidosis  -Patient continue to have PCO2 in the 63.9 pH 7.20 P: Continue ventilator support with lung protective strategies per ARDS protocol  Wean PEEP and FiO2 for sats greater than 88%. Permissive hypercapnia, pH goal 7.2 or better. Head of bed elevated 30 degrees. Plateau pressures less than 30 cm H20 as able, currently 32 Driving pressure goal less than 15 to prevent barotrauma, currently 20 with chest tube in place Follow intermittent chest x-ray and ABG Hold SAT/SBT until off NMB Ensure adequate pulmonary hygiene  Follow cultures  VAP bundle in place  Initiated NMB and prone positioning on 9/25 PAD protocol; continuous Fentanyl, Versed, and Vecuronium  Daily assessment of need for Lasix , 80 mg lasix 9/27  Left Apical Pneumothorax Chest  tube placed 9/26 with sub-optimal resolution P: Routine chest tube care  Flush pigtail chest tube   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 P: Continue SSI and  Lantus Increase tube feed coverage CBG q4hrs   HTN -Home medications include Diltiazem and HCTZ P: Continue oral Cardizem Hold home HCTZ  Acute Urinary retention with oliguric AKI -in the setting of likely ATN in the setting of COVID/shock . Creatine has doubled since 9/24 currently 2.75 with worsening renal output P: Consult nephrology  Follow renal function / urine output Trend Bmet Avoid nephrotoxins, ensure adequate renal perfusion   Best practice:  Diet: tube feeds Pain/Anxiety/Delirium protocol (if indicated): deep sedation as above VAP protocol (if indicated): n/a DVT prophylaxis: lovenox GI prophylaxis: n/a Glucose control: Lantus and SSI Mobility: bed rest Code Status: full Family Communication: Will update husband again over the phone 9/27 Disposition: to ICU  Labs   CBC: Recent Labs  Lab 06/12/20 0230 06/13/20 1144 06/15/20 0337 06/15/20 0919 06/16/20 0746 06/17/20 0745 06/17/20 1114 06/17/20 1810 06/18/20 0331  WBC 19.9*  --  15.9*  --  28.9* 18.7*  --   --  14.8*  NEUTROABS  --   --   --   --  27.1*  --   --   --   --   HGB 15.1*   < > 13.7   < > 14.2 12.2 11.6* 10.9* 11.5*  HCT 46.9*   < > 43.0   < > 46.7* 40.5 34.0* 32.0* 38.3  MCV 90.5  --  90.1  --  95.9 96.4  --   --  96.2  PLT 443*  --  302  --  307 160  --   --  150   < > = values in this interval not displayed.    Basic Metabolic Panel: Recent Labs  Lab 06/12/20 0230 06/13/20 1144 06/15/20 0337 06/15/20 0919 06/15/20 2140 06/16/20 0746 06/16/20 1840 06/17/20 0308 06/17/20 0745 06/17/20 1114 06/17/20 1810 06/18/20 0331  NA 138   < > 139   < >  --  143  --   --  144 142 141 141  K 4.2   < > 4.6   < >  --  5.7*  --   --  4.7 4.6 4.6 5.2*  CL 100  --  104  --   --  105  --   --  107  --   --  107  CO2 23  --  22  --   --  20*  --   --  24  --   --  25  GLUCOSE 214*  --  194*  --   --  212*  --   --  214*  --   --  263*  BUN 30*  --  34*  --   --  59*  --   --  105*  --   --   129*  CREATININE 0.87  --  0.82  --   --  1.61*  --   --  2.40*  --   --  2.75*  CALCIUM 9.6  --  9.2  --   --  8.7*  --   --  8.7*  --   --  8.7*  MG 2.4  --  2.3  --  3.0* 2.9* 2.7* 3.3*  --   --   --   --   PHOS 3.9  --  4.5  --  11.9*  9.6* 5.5* 6.5*  --   --   --   --    < > = values in this interval not displayed.   GFR: Estimated Creatinine Clearance: 20 mL/min (A) (by C-G formula based on SCr of 2.75 mg/dL (H)). Recent Labs  Lab 06/15/20 0337 06/16/20 0746 06/17/20 0745 06/18/20 0331  WBC 15.9* 28.9* 18.7* 14.8*    Liver Function Tests: Recent Labs  Lab 06/15/20 0337  AST 24  ALT 34  ALKPHOS 86  BILITOT 0.8  PROT 6.2*  ALBUMIN 2.4*   No results for input(s): LIPASE, AMYLASE in the last 168 hours. No results for input(s): AMMONIA in the last 168 hours.  ABG    Component Value Date/Time   PHART 7.208 (L) 06/17/2020 1810   PCO2ART 63.9 (H) 06/17/2020 1810   PO2ART 76 (L) 06/17/2020 1810   HCO3 25.4 06/17/2020 1810   TCO2 27 06/17/2020 1810   ACIDBASEDEF 3.0 (H) 06/17/2020 1810   O2SAT 91.0 06/17/2020 1810     Coagulation Profile: No results for input(s): INR, PROTIME in the last 168 hours.  Cardiac Enzymes: No results for input(s): CKTOTAL, CKMB, CKMBINDEX, TROPONINI in the last 168 hours.  HbA1C: Hemoglobin A1C  Date/Time Value Ref Range Status  07/10/2012 06:44 PM 5.8  Final   Hgb A1c MFr Bld  Date/Time Value Ref Range Status  05/28/2020 04:00 AM 6.9 (H) 4.8 - 5.6 % Final    Comment:    (NOTE) Pre diabetes:          5.7%-6.4%  Diabetes:              >6.4%  Glycemic control for   <7.0% adults with diabetes     CBG: Recent Labs  Lab 06/17/20 1454 06/17/20 2009 06/17/20 2342 06/18/20 0328 06/18/20 0733  GLUCAP 290* 335* 328* 226* 212*   CRITICAL CARE Performed by: Johnsie Cancel  Total critical care time: 40 minutes  Critical care time was exclusive of separately billable procedures and treating other patients.  Critical care  was necessary to treat or prevent imminent or life-threatening deterioration.  Critical care was time spent personally by me on the following activities: development of treatment plan with patient and/or surrogate as well as nursing, discussions with consultants, evaluation of patient's response to treatment, examination of patient, obtaining history from patient or surrogate, ordering and performing treatments and interventions, ordering and review of laboratory studies, ordering and review of radiographic studies, pulse oximetry and re-evaluation of patient's condition.  Johnsie Cancel, NP-C [facility] Contact / Pager information can be found on Amion  06/18/2020, 10:50 AM

## 2020-06-18 NOTE — Consult Note (Signed)
Reason for Consult: Acute kidney injury Referring Physician: Lutricia Feil, MD (CCM)  HPI:  59 year old Caucasian woman with past medical history significant for migraine type headaches, bronchial asthma, anxiety, restless leg syndrome and baseline normal renal function (creatinine 0.8-0.9) originally admitted to the hospital on 06/08/2020 with Covid pneumonia treated with remdesivir/Solu-Medrol and Barcitinib.  She unfortunately developed worsening hypoxemia on 06/10/2020 following which she was started on NIPPV alternating with HFNC/NRB oxygen supplementation.  Creatinine appears to have been relatively stable until 9/24 (0.8-1.0) and on 9/25 had risen to 1.6 and today is up to 2.7.  She suffered worsening hypoxic respiratory failure on 9/24 when she was intubated and on 9/25 was noted to have pneumothorax for which a first chest tube was inserted with a second inserted on 9/26 for residual pneumothorax.  Tachycardia and significant hypotension noted of 71/56 on 9/25.  Urine output overnight 205 cc.   06/11/2020 04:19 06/12/2020 02:30 06/15/2020 03:37 06/16/2020 07:46 06/17/2020 07:45 06/18/2020 03:31  Creatinine 0.88 0.87 0.82 1.61 (H) 2.40 (H) 2.75 (H)  Calcium 9.0 9.6 9.2 8.7 (L) 8.7 (L) 8.7 (L)   She had a CT angiogram on 9/15.  Past Medical History:  Diagnosis Date  . Acid reflux   . Anxiety   . Asthma   . Migraine   . Restless leg syndrome   . Skin cancer, basal cell   . Tachycardia     Past Surgical History:  Procedure Laterality Date  . ABDOMINAL HYSTERECTOMY    . TONSILLECTOMY      Family History  Problem Relation Age of Onset  . Hypertension Maternal Grandmother   . Hypertension Mother   . CAD Neg Hx     Social History:  reports that she has never smoked. She has never used smokeless tobacco. She reports current alcohol use. She reports that she does not use drugs.  Allergies:  Allergies  Allergen Reactions  . Latex Rash and Anaphylaxis  . Other     BEE STINGS  .  Avelox [Moxifloxacin Hcl In Nacl] Itching and Rash    Medications:  Scheduled: . artificial tears  1 application Both Eyes O7S  . vitamin C  500 mg Per Tube Daily  . chlorhexidine gluconate (MEDLINE KIT)  15 mL Mouth Rinse BID  . Chlorhexidine Gluconate Cloth  6 each Topical Daily  . diltiazem  60 mg Per Tube Q6H  . enoxaparin (LOVENOX) injection  30 mg Subcutaneous Q24H  . feeding supplement (PROSource TF)  45 mL Per Tube TID  . free water  100 mL Per Tube Q3H  . insulin aspart  0-15 Units Subcutaneous Q4H  . insulin aspart  6 Units Subcutaneous Q4H  . insulin glargine  10 Units Subcutaneous BID  . lactulose  20 g Per Tube Daily  . linagliptin  5 mg Per Tube Daily  . mouth rinse  15 mL Mouth Rinse 10 times per day  . methylPREDNISolone (SOLU-MEDROL) injection  50 mg Intravenous Q12H  . metoCLOPramide (REGLAN) injection  5 mg Intravenous Q8H  . montelukast  10 mg Per Tube QHS  . multivitamin with minerals  1 tablet Per Tube Daily  . polyethylene glycol  17 g Per Tube Daily  . pramipexole  1 mg Per Tube QHS  . senna-docusate  2 tablet Per Tube BID  . sodium chloride flush  10-40 mL Intracatheter Q12H  . zinc sulfate  220 mg Per Tube Daily    BMP Latest Ref Rng & Units 06/18/2020 06/17/2020 06/17/2020  Glucose  70 - 99 mg/dL 263(H) - -  BUN 6 - 20 mg/dL 129(H) - -  Creatinine 0.44 - 1.00 mg/dL 2.75(H) - -  Sodium 135 - 145 mmol/L 141 141 142  Potassium 3.5 - 5.1 mmol/L 5.2(H) 4.6 4.6  Chloride 98 - 111 mmol/L 107 - -  CO2 22 - 32 mmol/L 25 - -  Calcium 8.9 - 10.3 mg/dL 8.7(L) - -   CBC Latest Ref Rng & Units 06/18/2020 06/17/2020 06/17/2020  WBC 4.0 - 10.5 K/uL 14.8(H) - -  Hemoglobin 12.0 - 15.0 g/dL 11.5(L) 10.9(L) 11.6(L)  Hematocrit 36 - 46 % 38.3 32.0(L) 34.0(L)  Platelets 150 - 400 K/uL 150 - -     DG Chest 1 View  Result Date: 06/17/2020 CLINICAL DATA:  New left chest tube. EXAM: CHEST  1 VIEW COMPARISON:  June 16, 2020 FINDINGS: Endotracheal tube terminates in  the left mainstem bronchus. Retraction by approximately 4 cm is recommended. A second chest tube has been placed on the left with decreased in volume left pneumothorax. Left IJ approach central venous catheter is in stable position. Cardiomediastinal silhouette is normal. Mediastinal contours appear intact. Persistent diffuse airspace consolidation in bilateral lungs with lower lobe predominance. Osseous structures are without acute abnormality. Soft tissues are grossly normal. IMPRESSION: 1. Endotracheal tube terminates in the left mainstem bronchus. Retraction by approximately 4 cm is recommended. 2. Interval placement of a second left chest tube with decreased in volume left pneumothorax. 3. Persistent diffuse airspace consolidation in bilateral lungs. These results were called by telephone at the time of interpretation on 06/17/2020 at 5:13 pm to provider [facility] , who verbally acknowledged these results. 1. Electronically Signed   By: Fidela Salisbury M.D.   On: 06/17/2020 17:11   DG CHEST PORT 1 VIEW  Result Date: 06/16/2020 CLINICAL DATA:  History of COVID-19, chest tube in place EXAM: PORTABLE CHEST 1 VIEW COMPARISON:  Radiograph 06/16/2020 FINDINGS: Endotracheal tube terminates in the mid trachea 3.6 cm from the carina. Transesophageal tube tip terminates below the margins of imaging, beyond the GE junction. Left IJ approach catheter tip terminates at the superior cavoatrial junction. A partially coiled left pigtail pleural drain is in place. Telemetry leads overlie the chest. There is a residual small left apical pneumothorax despite the placement of a left chest tube/pleural drain. Additional lucency is seen along the mediastinal borders which could indicate some developing pneumomediastinum with additional lucencies at the base of the neck further supporting this hypothesis. Subcutaneous emphysema seen across the left chest wall with this is less specific and possibly related to the chest tube  placement. Redemonstration of the multifocal areas of mixed consolidative and interstitial opacity throughout both lungs, slightly increased in the left lung base though possibly related to atelectatic change. Opacities otherwise unchanged from comparison. Cardiomediastinal contours are stable. No acute osseous abnormalities. IMPRESSION: 1. Residual small left apical pneumothorax despite placement of a left chest tube/pleural drain. 2. Additional lucency along the mediastinal borders and base of the neck may suggest pneumomediastinum. 3. Subcutaneous emphysema along the left chest wall is less specific. Possibly related to tube placement. 4. Persistent multifocal areas of mixed consolidative and interstitial opacity throughout both lungs, slightly increased in the left lung base compatible with history of COVID 19 positivity. Electronically Signed   By: Lovena Le M.D.   On: 06/16/2020 19:34   DG CHEST PORT 1 VIEW  Result Date: 06/16/2020 CLINICAL DATA:  Status post central line placement EXAM: PORTABLE CHEST 1 VIEW COMPARISON:  06/15/2020 FINDINGS: Cardiac shadow is stable. Endotracheal tube, feeding catheter and left jugular central line are noted in satisfactory position. A new left-sided pneumothorax is noted however likely related to the catheter placement. Diffuse bilateral airspace opacities are again seen consistent with the given clinical history of COVID-19 positivity. IMPRESSION: New left-sided pneumothorax with approximately 2 cm excursion on frontal film. New left-sided central line in satisfactory position. Stable bilateral airspace opacities consistent with the given clinical history of COVID-19 positivity. Critical Value/emergent results were called by telephone at the time of interpretation on 06/16/2020 at 4:16 pm to [facility], the pts nurse, who verbally acknowledged these results and will contact the pts doctor. Electronically Signed   By: Inez Catalina M.D.   On: 06/16/2020 16:18    Review of  Systems  Unable to perform ROS: Intubated   Blood pressure (!) 120/51, pulse 95, temperature (!) 96.9 F (36.1 C), temperature source Axillary, resp. rate (!) 35, height 4' 11"  (1.499 m), weight 80.5 kg, SpO2 93 %. Physical Exam Vitals and nursing note reviewed.  Constitutional:      Appearance: She is obese. She is ill-appearing.     Interventions: She is intubated.     Comments: Intubated, sedated  HENT:     Head: Normocephalic and atraumatic.  Neck:     Vascular: JVD present.  Cardiovascular:     Rate and Rhythm: Normal rate and regular rhythm.     Pulses: Normal pulses.     Heart sounds: No murmur heard.  No gallop.   Pulmonary:     Effort: Pulmonary effort is normal. Tachypnea present. She is intubated.     Breath sounds: Examination of the left-lower field reveals decreased breath sounds. Decreased breath sounds present.  Chest:     Chest wall: No crepitus or edema.  Abdominal:     General: Bowel sounds are normal.     Palpations: Abdomen is soft. There is no hepatomegaly or mass.  Musculoskeletal:     Cervical back: Neck supple.     Right lower leg: Edema present.     Left lower leg: Edema present.     Comments: Trace-1+ lower extremity edema    Assessment/Plan: 1.  Acute kidney injury: Oliguric.  Likely hemodynamically mediated ischemic ATN from relative hypotension arising from hemodynamic effects of pneumothorax and worsening respiratory failure.  I will send off for some urine electrolytes and defer renal ultrasound at this time given low index of suspicion for urinary retention with indwelling Foley catheter.  No acute indications for dialysis however, with worsening azotemia, this is likely imminent. Avoid nephrotoxic medications including NSAIDs and iodinated intravenous contrast exposure unless the latter is absolutely indicated.  Preferred narcotic agents for pain control are hydromorphone, fentanyl, and methadone. Morphine should not be used. Avoid Baclofen and  avoid oral sodium phosphate and magnesium citrate based laxatives / bowel preps. Continue strict Input and Output monitoring. Will monitor the patient closely with you and intervene or adjust therapy as indicated by changes in clinical status/labs  2.  COVID-19 pneumonia with severe ARDS: Status post chest tube placement for pneumothorax with ongoing ventilator adjustments per CCM. 3.  Hypertension: Hold hydrochlorothiazide at this time and continue diltiazem primarily for control of tachycardia. 4.  Hyperkalemia: Mild, status post attempted furosemide challenge and will give Lokelma. 5.  Anemia of critical illness: With some losses associated with chest tube, continue to monitor for PRBC transfusion needs. Crystalmarie Yasin K. 06/18/2020, 2:50 PM

## 2020-06-19 ENCOUNTER — Inpatient Hospital Stay (HOSPITAL_COMMUNITY): Payer: 59

## 2020-06-19 DIAGNOSIS — Z9689 Presence of other specified functional implants: Secondary | ICD-10-CM

## 2020-06-19 DIAGNOSIS — N179 Acute kidney failure, unspecified: Secondary | ICD-10-CM

## 2020-06-19 LAB — RENAL FUNCTION PANEL
Albumin: 2.2 g/dL — ABNORMAL LOW (ref 3.5–5.0)
Anion gap: 11 (ref 5–15)
BUN: 178 mg/dL — ABNORMAL HIGH (ref 6–20)
CO2: 23 mmol/L (ref 22–32)
Calcium: 8.9 mg/dL (ref 8.9–10.3)
Chloride: 103 mmol/L (ref 98–111)
Creatinine, Ser: 3.55 mg/dL — ABNORMAL HIGH (ref 0.44–1.00)
GFR calc Af Amer: 15 mL/min — ABNORMAL LOW (ref >60)
GFR calc non Af Amer: 13 mL/min — ABNORMAL LOW (ref >60)
Glucose, Bld: 258 mg/dL — ABNORMAL HIGH (ref 70–99)
Phosphorus: 6.4 mg/dL — ABNORMAL HIGH (ref 2.5–4.6)
Potassium: 5.5 mmol/L — ABNORMAL HIGH (ref 3.5–5.1)
Sodium: 137 mmol/L (ref 135–145)

## 2020-06-19 LAB — POCT I-STAT 7, (LYTES, BLD GAS, ICA,H+H)
Acid-base deficit: 1 mmol/L (ref 0.0–2.0)
Acid-base deficit: 2 mmol/L (ref 0.0–2.0)
Bicarbonate: 28.3 mmol/L — ABNORMAL HIGH (ref 20.0–28.0)
Bicarbonate: 26.4 mmol/L (ref 20.0–28.0)
Calcium, Ion: 1.2 mmol/L (ref 1.15–1.40)
Calcium, Ion: 1.29 mmol/L (ref 1.15–1.40)
HCT: 29 % — ABNORMAL LOW (ref 36.0–46.0)
HCT: 28 % — ABNORMAL LOW (ref 36.0–46.0)
Hemoglobin: 9.9 g/dL — ABNORMAL LOW (ref 12.0–15.0)
Hemoglobin: 9.5 g/dL — ABNORMAL LOW (ref 12.0–15.0)
O2 Saturation: 97 %
O2 Saturation: 84 %
Patient temperature: 96.6
Patient temperature: 96.5
Potassium: 5.4 mmol/L — ABNORMAL HIGH (ref 3.5–5.1)
Potassium: 5.5 mmol/L — ABNORMAL HIGH (ref 3.5–5.1)
Sodium: 136 mmol/L (ref 135–145)
Sodium: 135 mmol/L (ref 135–145)
TCO2: 30 mmol/L (ref 22–32)
TCO2: 28 mmol/L (ref 22–32)
pCO2 arterial: 69.2 mmHg (ref 32.0–48.0)
pCO2 arterial: 65.1 mmHg (ref 32.0–48.0)
pH, Arterial: 7.213 — ABNORMAL LOW (ref 7.350–7.450)
pH, Arterial: 7.209 — ABNORMAL LOW (ref 7.350–7.450)
pO2, Arterial: 108 mmHg (ref 83.0–108.0)
pO2, Arterial: 56 mmHg — ABNORMAL LOW (ref 83.0–108.0)

## 2020-06-19 LAB — BASIC METABOLIC PANEL
Anion gap: 11 (ref 5–15)
BUN: 161 mg/dL — ABNORMAL HIGH (ref 6–20)
CO2: 25 mmol/L (ref 22–32)
Calcium: 9 mg/dL (ref 8.9–10.3)
Chloride: 102 mmol/L (ref 98–111)
Creatinine, Ser: 3.41 mg/dL — ABNORMAL HIGH (ref 0.44–1.00)
GFR calc Af Amer: 16 mL/min — ABNORMAL LOW (ref >60)
GFR calc non Af Amer: 14 mL/min — ABNORMAL LOW (ref >60)
Glucose, Bld: 251 mg/dL — ABNORMAL HIGH (ref 70–99)
Potassium: 5.9 mmol/L — ABNORMAL HIGH (ref 3.5–5.1)
Sodium: 138 mmol/L (ref 135–145)

## 2020-06-19 LAB — GLUCOSE, CAPILLARY
Glucose-Capillary: 206 mg/dL — ABNORMAL HIGH (ref 70–99)
Glucose-Capillary: 206 mg/dL — ABNORMAL HIGH (ref 70–99)
Glucose-Capillary: 227 mg/dL — ABNORMAL HIGH (ref 70–99)
Glucose-Capillary: 230 mg/dL — ABNORMAL HIGH (ref 70–99)
Glucose-Capillary: 244 mg/dL — ABNORMAL HIGH (ref 70–99)
Glucose-Capillary: 252 mg/dL — ABNORMAL HIGH (ref 70–99)

## 2020-06-19 LAB — CBC
HCT: 33.2 % — ABNORMAL LOW (ref 36.0–46.0)
Hemoglobin: 10.3 g/dL — ABNORMAL LOW (ref 12.0–15.0)
MCH: 29.9 pg (ref 26.0–34.0)
MCHC: 31 g/dL (ref 30.0–36.0)
MCV: 96.2 fL (ref 80.0–100.0)
Platelets: 125 10*3/uL — ABNORMAL LOW (ref 150–400)
RBC: 3.45 MIL/uL — ABNORMAL LOW (ref 3.87–5.11)
RDW: 13.1 % (ref 11.5–15.5)
WBC: 10 10*3/uL (ref 4.0–10.5)
nRBC: 0.4 % — ABNORMAL HIGH (ref 0.0–0.2)

## 2020-06-19 LAB — UREA NITROGEN, URINE: Urea Nitrogen, Ur: 369 mg/dL

## 2020-06-19 MED ORDER — PRISMASOL BGK 4/2.5 32-4-2.5 MEQ/L IV SOLN
INTRAVENOUS | Status: DC
  Filled 2020-06-19 (×27): qty 5000

## 2020-06-19 MED ORDER — SODIUM CHLORIDE 0.9 % IV SOLN
INTRAVENOUS | Status: DC | PRN
  Administered 2020-06-19: 1000 mL via INTRAVENOUS

## 2020-06-19 MED ORDER — PRISMASOL BGK 0/2.5 32-2.5 MEQ/L IV SOLN
INTRAVENOUS | Status: DC
  Filled 2020-06-19 (×7): qty 5000

## 2020-06-19 MED ORDER — INSULIN GLARGINE 100 UNIT/ML ~~LOC~~ SOLN
12.0000 [IU] | Freq: Two times a day (BID) | SUBCUTANEOUS | Status: DC
  Administered 2020-06-19 – 2020-06-20 (×2): 12 [IU] via SUBCUTANEOUS
  Filled 2020-06-19 (×3): qty 0.12

## 2020-06-19 MED ORDER — SODIUM CHLORIDE 0.45 % IV SOLN: INTRAVENOUS | Status: DC

## 2020-06-19 MED ORDER — VITAL 1.5 CAL PO LIQD: 1000.0000 mL | ORAL | Status: DC

## 2020-06-19 MED ORDER — PRISMASOL BGK 0/2.5 32-2.5 MEQ/L IV SOLN
INTRAVENOUS | Status: DC
  Filled 2020-06-19 (×11): qty 5000

## 2020-06-19 MED ORDER — HEPARIN (PORCINE) 2000 UNITS/L FOR CRRT: INTRAVENOUS_CENTRAL | Status: DC | PRN

## 2020-06-19 MED ORDER — VITAL 1.5 CAL PO LIQD
1000.0000 mL | ORAL | Status: DC
  Administered 2020-06-19 – 2020-06-22 (×5): 1000 mL
  Filled 2020-06-19 (×3): qty 1000

## 2020-06-19 MED ORDER — B COMPLEX-C PO TABS: 1.0000 | ORAL_TABLET | Freq: Every day | ORAL | Status: DC

## 2020-06-19 MED ORDER — SODIUM ZIRCONIUM CYCLOSILICATE 10 G PO PACK
10.0000 g | PACK | Freq: Once | ORAL | Status: AC
  Administered 2020-06-19: 10 g
  Filled 2020-06-19: qty 1

## 2020-06-19 MED ORDER — PROSOURCE TF PO LIQD
90.0000 mL | Freq: Four times a day (QID) | ORAL | Status: DC
  Administered 2020-06-19 – 2020-06-22 (×13): 90 mL
  Filled 2020-06-19 (×12): qty 90

## 2020-06-19 MED ORDER — HEPARIN SODIUM (PORCINE) 1000 UNIT/ML DIALYSIS
1000.0000 [IU] | INTRAMUSCULAR | Status: DC | PRN
  Filled 2020-06-19: qty 6

## 2020-06-19 NOTE — Progress Notes (Signed)
Patient ID: Claudia Vega, female   DOB: 1961-08-22, 59 y.o.   MRN: [id number] [facility] Progress Note   Assessment/ Plan:   1.  Acute kidney injury: Oliguric. Suspect ischemic ATN from relative hypotension arising from hemodynamic effects of pneumothorax and worsening respiratory failure in setting of COVID-19 pneumonia- corroborated by urine electrolytes.  Azotemia and hyperkalemia worse overnight- begin CRRT.   2.  COVID-19 pneumonia with severe ARDS: Status post chest tube placement for pneumothorax with ongoing ventilator adjustments per CCM. 3.  Hypertension: Hold hydrochlorothiazide at this time and continue diltiazem primarily for control of tachycardia. 4.  Hyperkalemia: Unresponsive to lasix/lokelma--start CRRT 5.  Anemia of critical illness: With some losses associated with chest tube, continue to monitor for PRBC transfusion needs. Subjective:   Worsening labs noted this morning   Objective:   BP (!) 125/52   Pulse 80   Temp (!) 96.6 F (35.9 C) (Axillary)   Resp (!) 35   Ht 4' 11"  (1.499 m)   Wt 80.5 kg   SpO2 91%   BMI 35.84 kg/m   Intake/Output Summary (Last 24 hours) at 06/19/2020 0840 Last data filed at 06/19/2020 0800 Gross per 24 hour  Intake 1976.44 ml  Output 320 ml  Net 1656.44 ml   Weight change:   Physical Exam: PZW:CHENID position on ventilator POE:UMPNT regular rhythm and normal rate Resp:clear over posterior lung fields bilaterally IRW:ERXVQM to examine (patient prone) GQQ:PYPPJ-0+ LE edema  Imaging: DG Chest 1 View  Result Date: 06/17/2020 CLINICAL DATA:  New left chest tube. EXAM: CHEST  1 VIEW COMPARISON:  June 16, 2020 FINDINGS: Endotracheal tube terminates in the left mainstem bronchus. Retraction by approximately 4 cm is recommended. A second chest tube has been placed on the left with decreased in volume left pneumothorax. Left IJ approach central venous catheter is in stable position. Cardiomediastinal silhouette is  normal. Mediastinal contours appear intact. Persistent diffuse airspace consolidation in bilateral lungs with lower lobe predominance. Osseous structures are without acute abnormality. Soft tissues are grossly normal. IMPRESSION: 1. Endotracheal tube terminates in the left mainstem bronchus. Retraction by approximately 4 cm is recommended. 2. Interval placement of a second left chest tube with decreased in volume left pneumothorax. 3. Persistent diffuse airspace consolidation in bilateral lungs. These results were called by telephone at the time of interpretation on 06/17/2020 at 5:13 pm to provider [facility] , who verbally acknowledged these results. 1. Electronically Signed   By: Fidela Salisbury M.D.   On: 06/17/2020 17:11    Labs: [employer] Recent Labs  Lab 06/15/20 0337 06/15/20 0919 06/15/20 2140 06/16/20 0746 06/16/20 0746 06/16/20 1840 06/17/20 0308 06/17/20 0745 06/17/20 1114 06/17/20 1810 06/18/20 0331 06/18/20 1516 06/18/20 1530 06/19/20 0409  NA 139   < >  --  143  --   --   --  144 142 141 141 139  --  138  K 4.6   < >  --  5.7*   < >  --   --  4.7 4.6 4.6 5.2* 5.5* 5.5* 5.9*  CL 104  --   --  105  --   --   --  107  --   --  107  --   --  102  CO2 22  --   --  20*  --   --   --  24  --   --  25  --   --  25  GLUCOSE 194*  --   --  212*  --   --   --  214*  --   --  263*  --   --  251*  BUN 34*  --   --  59*  --   --   --  105*  --   --  129*  --   --  161*  CREATININE 0.82  --   --  1.61*  --   --   --  2.40*  --   --  2.75*  --   --  3.41*  CALCIUM 9.2  --   --  8.7*  --   --   --  8.7*  --   --  8.7*  --   --  9.0  PHOS 4.5  --  11.9* 9.6*  --  5.5* 6.5*  --   --   --   --   --   --   --    < > = values in this interval not displayed.   CBC Recent Labs  Lab 06/16/20 0746 06/16/20 0746 06/17/20 0745 06/17/20 1114 06/17/20 1810 06/18/20 0331 06/18/20 1516 06/19/20 0409  WBC 28.9*  --  18.7*  --   --  14.8*  --  10.0  NEUTROABS 27.1*  --   --   --   --   --    --   --   HGB 14.2   < > 12.2   < > 10.9* 11.5* 10.2* 10.3*  HCT 46.7*   < > 40.5   < > 32.0* 38.3 30.0* 33.2*  MCV 95.9  --  96.4  --   --  96.2  --  96.2  PLT 307  --  160  --   --  150  --  125*   < > = values in this interval not displayed.    Medications:    . artificial tears  1 application Both Eyes D6U  . vitamin C  500 mg Per Tube Daily  . chlorhexidine gluconate (MEDLINE KIT)  15 mL Mouth Rinse BID  . Chlorhexidine Gluconate Cloth  6 each Topical Daily  . diltiazem  30 mg Per Tube Q6H  . enoxaparin (LOVENOX) injection  30 mg Subcutaneous Q24H  . feeding supplement (PROSource TF)  45 mL Per Tube TID  . free water  100 mL Per Tube Q3H  . insulin aspart  0-15 Units Subcutaneous Q4H  . insulin aspart  6 Units Subcutaneous Q4H  . insulin glargine  10 Units Subcutaneous BID  . lactulose  20 g Per Tube Daily  . linagliptin  5 mg Per Tube Daily  . mouth rinse  15 mL Mouth Rinse 10 times per day  . methylPREDNISolone (SOLU-MEDROL) injection  50 mg Intravenous Q12H  . metoCLOPramide (REGLAN) injection  5 mg Intravenous Q8H  . montelukast  10 mg Per Tube QHS  . multivitamin with minerals  1 tablet Per Tube Daily  . polyethylene glycol  17 g Per Tube Daily  . pramipexole  1 mg Per Tube QHS  . senna-docusate  2 tablet Per Tube BID  . sodium chloride flush  10-40 mL Intracatheter Q12H  . zinc sulfate  220 mg Per Tube Daily   Elmarie Shiley, MD 06/19/2020, 8:40 AM

## 2020-06-19 NOTE — Progress Notes (Signed)
PT Cancellation Note  Patient Details Name: Claudia Vega MRN: [id number] DOB: 05-Jun-1961   Cancelled Treatment:    Reason Eval/Treat Not Completed: Medical issues which prohibited therapy;Patient not medically ready  06/19/2020  Ginger Carne., PT Acute Rehabilitation Services [phone]  (pager) [phone]  (office)   [name] 06/19/2020, 2:55 PM

## 2020-06-19 NOTE — Progress Notes (Signed)
Patient's head and arms repositioned at this time without complications.

## 2020-06-19 NOTE — Progress Notes (Signed)
Husband, Rashanna Christiana was updated on patient's condition. All questions were answered at this time.

## 2020-06-19 NOTE — Progress Notes (Addendum)
[facility] Progress Note Patient Name: Claudia Vega DOB: 1961-01-20 MRN: [id number]   Date of Service  06/19/2020  HPI/Events of Note  Hypothermia - Tem = 91.6 F.  eICU Interventions  Plan: 1. Bair Hugger PRN.      Intervention Category Major Interventions: Other:  Lysle Dingwall 06/19/2020, 7:59 PM

## 2020-06-19 NOTE — Progress Notes (Signed)
Assisted tele visit to patient with husband.  Claudia Vega M Claudia Bertucci, RN   

## 2020-06-19 NOTE — Progress Notes (Signed)
I attempted to call the patient's husband to update him on the status of his wife, and to review her plan of care for the day. There was no answer. I have left a HIPPA compliant message on his VM requesting that he call the unit for an update, and have the nurses let PCCM know, so we can update him.  Nothing further needed.

## 2020-06-19 NOTE — Progress Notes (Signed)
OT Cancellation Note  Patient Details Name: Claudia Vega MRN: [id number] DOB: 11/18/1960   Cancelled Treatment:    Reason Eval/Treat Not Completed: Patient not medically ready. Sedated and Intubated 9/27.On  Peep of 12. Will follow and continue when medically appropriate.   Ramond Dial, OT/L   Acute OT Clinical Specialist Acute Rehabilitation Services Pager [phone] Office [phone]  06/19/2020, 6:54 AM

## 2020-06-19 NOTE — Progress Notes (Signed)
NAME:  Claudia Vega, MRN:  [id number], DOB:  Apr 23, 1961, LOS: 64 ADMISSION DATE:  06/11/2020, CONSULTATION DATE: September 19 REFERRING MD: Dr. Noah Delaine, CHIEF COMPLAINT: Dyspnea  Brief History   59 year old female admitted for Covid pneumonia on May 27, 2020 treated with remdesivir, Solu-Medrol, baricitinib developed worsening hypoxemia on June 10, 2020 so pulmonary and critical care medicine was consulted.  Has been on CPAP alternating with high flow nasal cannula/nonrebreather mask. Intubated 9/24  Past Medical History  Asthma Migraine GERD  Significant Hospital Events   9/5 admission 9/17 epistaxis while on full dose anticoagulation, adjusted back to prophylactic dosing 9/19 transfer to ICU 9/24 intubated  Consults:  PCCM  Procedures:  ETT 9/24 > Left A-line 9/24 > Left IJ 9/25 > Left chest tube 9/26  Significant Diagnostic Tests:  9/15 CTA chest images independently reviewed showing no pulmonary embolism, diffuse bilateral airspace disease  Micro Data:  9/5 SARS-CoV-2 positive 9/5 blood culture negative  Antimicrobials:  9/5 remdesivir> 9/9 9/5 Solu-Medrol> tapered to prednisone current 9/5 baricitinib> 9/18  Interim history/subjective:  Proned sedated and paralyzed Renal function continues to worsen  Creatinine 3.41, with decreasing urine output of only 307 last 24 hours Currently net +11 L,  7.21/69.2/108/28.3, Needs HD>> HD cath to be inserted 9/28 Supine now L Chest tube with 1 chamber leak>> 13 cc's out last 24>> Will check CXR in am 9/29 Lokelma x 2 for K of 5.8 overnight 9/28   Objective   Blood pressure 138/67, pulse 79, temperature 97.8 F (36.6 C), temperature source Oral, resp. rate (!) 35, height 4' 11"  (1.499 m), weight 80.5 kg, SpO2 99 %.    Vent Mode: PRVC FiO2 (%):  [90 %-100 %] 100 % Set Rate:  [35 bmp] 35 bmp Vt Set:  [350 mL] 350 mL PEEP:  [12 cmH20] 12 cmH20 Plateau Pressure:  [29 cmH20-33 cmH20] 31 cmH20    Intake/Output Summary (Last 24 hours) at 06/19/2020 1138 Last data filed at 06/19/2020 1100 Gross per 24 hour  Intake 1908.27 ml  Output 283 ml  Net 1625.27 ml   Filed Weights   06/16/20 0445 06/17/20 0309 06/18/20 0331  Weight: 78.7 kg 77.4 kg 80.5 kg    Examination: General: Acute on chronic ill-appearing middle-aged female prolonged all deeply sedated and paralyzed, HEENT: ETT, secure and intact,  MM pink/moist, PERRL,  Neuro: Deeply sedated and paralyzed CV: s1s2 regular rate and rhythm, no murmur, rubs, or gallops,  PULM: Mechanical posterior breath sounds, tolerating ventilator, no added breath sounds, rate 35 GI: soft, bowel sounds active in all 4 quadrants, non-tender, slightly distended , tolerating TF Extremities: warm/dry, nonpitting lower extremity edema  Skin: no rashes or lesions, warm dry and intact  Resolved Hospital Problem list   Epistaxis Circulatory vs Septic Shock - vasopressor stopped 9/25  Assessment & Plan:   Acute hypoxic/hypercapnic respiratory failure and sepsis (POA) due to severe ARDS from COVID19 pneumonia -Continues to met criteria for sever ARDS with P/F ratio of 108  Currently supine positioning and heavy sedation including continuous NMB -Completed baricitinib 9/18   Acute respiratory acidosis  -Patient continue to have PCO2 in the 69.2 pH 7.21 P: Continue ventilator support with lung protective strategies per ARDS protocol  Wean PEEP and FiO2 for sats greater than 88%. Permissive hypercapnia, pH goal 7.2 or better. Head of bed elevated 30 degrees. Plateau pressures less than 30 cm H20 as able, currently 29 Driving pressure goal less than 15 to prevent barotrauma, currently 17 with chest  tube in place Follow intermittent chest x-ray and ABG Hold SAT/SBT until off NMB Ensure adequate pulmonary hygiene  Follow cultures  VAP bundle in place  Initiated NMB and prone positioning on 9/25 PAD protocol; continuous Fentanyl, Versed, and  Vecuronium  Daily assessment of need for Lasix , 80 mg lasix 9/27  Left Apical Pneumothorax Chest tube placed 9/26 with sub-optimal resolution Minimal output 1 chamber leak P: Routine chest tube care  Flush pigtail chest tube   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 P: Continue SSI ( Moderate coverage) and Lantus and Tradjenta Increase tube feed coverage CBG q4hrs   HTN -Home medications include Diltiazem and HCTZ P: Continue oral Cardizem Hold home HCTZ  Acute Urinary retention with oliguric AKI -in the setting of likely ATN in the setting of COVID/shock . Creatine has doubled since 9/24 currently 3.41 with worsening renal output P: Consult nephrology  Follow renal function / urine output Trend Bmet Avoid nephrotoxins, ensure adequate renal perfusion  HD cath placement today Will start CRRT today 9/28  Best practice:  Diet: tube feeds Pain/Anxiety/Delirium protocol (if indicated): deep sedation as above VAP protocol (if indicated): n/a DVT prophylaxis: lovenox GI prophylaxis: n/a Glucose control: Lantus and SSI Mobility: bed rest Code Status: full Family Communication: Will update husband again over the phone 9/28 Disposition: to ICU  Labs   CBC: Recent Labs  Lab 06/15/20 0337 06/15/20 0919 06/16/20 0746 06/16/20 0746 06/17/20 0745 06/17/20 1114 06/17/20 1810 06/18/20 0331 06/18/20 1516 06/19/20 0409 06/19/20 0908  WBC 15.9*  --  28.9*  --  18.7*  --   --  14.8*  --  10.0  --   NEUTROABS  --   --  27.1*  --   --   --   --   --   --   --   --   HGB 13.7   < > 14.2   < > 12.2   < > 10.9* 11.5* 10.2* 10.3* 9.9*  HCT 43.0   < > 46.7*   < > 40.5   < > 32.0* 38.3 30.0* 33.2* 29.0*  MCV 90.1  --  95.9  --  96.4  --   --  96.2  --  96.2  --   PLT 302  --  307  --  160  --   --  150  --  125*  --    < > = values in this interval not displayed.    Basic Metabolic Panel: Recent Labs  Lab 06/15/20 0337 06/15/20 0919 06/15/20 2140  06/16/20 0746 06/16/20 0746 06/16/20 1840 06/17/20 0308 06/17/20 0745 06/17/20 1114 06/17/20 1810 06/17/20 1810 06/18/20 0331 06/18/20 1516 06/18/20 1530 06/19/20 0409 06/19/20 0908  NA 139   < >  --  143   < >  --   --  144   < > 141  --  141 139  --  138 136  K 4.6   < >  --  5.7*   < >  --   --  4.7   < > 4.6   < > 5.2* 5.5* 5.5* 5.9* 5.4*  CL 104  --   --  105  --   --   --  107  --   --   --  107  --   --  102  --   CO2 22  --   --  20*  --   --   --  24  --   --   --  25  --   --  25  --   GLUCOSE 194*  --   --  212*  --   --   --  214*  --   --   --  263*  --   --  251*  --   BUN 34*  --   --  59*  --   --   --  105*  --   --   --  129*  --   --  161*  --   CREATININE 0.82  --   --  1.61*  --   --   --  2.40*  --   --   --  2.75*  --   --  3.41*  --   CALCIUM 9.2  --   --  8.7*  --   --   --  8.7*  --   --   --  8.7*  --   --  9.0  --   MG 2.3  --  3.0* 2.9*  --  2.7* 3.3*  --   --   --   --   --   --   --   --   --   PHOS 4.5  --  11.9* 9.6*  --  5.5* 6.5*  --   --   --   --   --   --   --   --   --    < > = values in this interval not displayed.   GFR: Estimated Creatinine Clearance: 16.1 mL/min (A) (by C-G formula based on SCr of 3.41 mg/dL (H)). Recent Labs  Lab 06/16/20 0746 06/17/20 0745 06/18/20 0331 06/19/20 0409  WBC 28.9* 18.7* 14.8* 10.0    Liver Function Tests: Recent Labs  Lab 06/15/20 0337  AST 24  ALT 34  ALKPHOS 86  BILITOT 0.8  PROT 6.2*  ALBUMIN 2.4*   No results for input(s): LIPASE, AMYLASE in the last 168 hours. No results for input(s): AMMONIA in the last 168 hours.  ABG    Component Value Date/Time   PHART 7.213 (L) 06/19/2020 0908   PCO2ART 69.2 (HH) 06/19/2020 0908   PO2ART 108 06/19/2020 0908   HCO3 28.3 (H) 06/19/2020 0908   TCO2 30 06/19/2020 0908   ACIDBASEDEF 1.0 06/19/2020 0908   O2SAT 97.0 06/19/2020 0908     Coagulation Profile: No results for input(s): INR, PROTIME in the last 168 hours.  Cardiac Enzymes: No  results for input(s): CKTOTAL, CKMB, CKMBINDEX, TROPONINI in the last 168 hours.  HbA1C: Hemoglobin A1C  Date/Time Value Ref Range Status  07/10/2012 06:44 PM 5.8  Final   Hgb A1c MFr Bld  Date/Time Value Ref Range Status  05/28/2020 04:00 AM 6.9 (H) 4.8 - 5.6 % Final    Comment:    (NOTE) Pre diabetes:          5.7%-6.4%  Diabetes:              >6.4%  Glycemic control for   <7.0% adults with diabetes     CBG: Recent Labs  Lab 06/18/20 1517 06/18/20 1956 06/18/20 2332 06/19/20 0341 06/19/20 0729  GLUCAP 324* 293* 233* 227* 252*   CRITICAL CARE Performed by: Magdalen Spatz  Total critical care time: 35 minutes  Critical care time was exclusive of separately billable procedures and treating other patients.  Critical care was necessary to treat or prevent imminent or life-threatening deterioration.  Critical care was time spent  personally by me on the following activities: development of treatment plan with patient and/or surrogate as well as nursing, discussions with consultants, evaluation of patient's response to treatment, examination of patient, obtaining history from patient or surrogate, ordering and performing treatments and interventions, ordering and review of laboratory studies, ordering and review of radiographic studies, pulse oximetry and re-evaluation of patient's condition.   Magdalen Spatz, MSN, AGACNP-BC [place] for personal pager PCCM on call pager [phone] 06/19/2020, 11:38 AM

## 2020-06-19 NOTE — Procedures (Signed)
Central Venous Catheter Insertion Procedure Note  Claudia Vega  [id number]  1961/06/03  Date:06/19/20  Time:1:35 PM   Provider Performing:Rashanda Magloire L Rust-Chester   Procedure: Insertion of Non-tunneled Central Venous Catheter(36556)with US guidance (53794)    Indication(s) Hemodialysis  Consent Risks of the procedure as well as the alternatives and risks of each were explained to the patient and/or caregiver.  Consent for the procedure was obtained and is signed in the bedside chart  Anesthesia Topical only with 1% lidocaine   Timeout Verified patient identification, verified procedure, site/side was marked, verified correct patient position, special equipment/implants available, medications/allergies/relevant history reviewed, required imaging and test results available.  Sterile Technique Maximal sterile technique including full sterile barrier drape, hand hygiene, sterile gown, sterile gloves, mask, hair covering, sterile ultrasound probe cover (if used).  Procedure Description Area of catheter insertion was cleaned with chlorhexidine and draped in sterile fashion.   With real-time ultrasound guidance a HD catheter was placed into the right internal jugular vein.  Nonpulsatile blood flow and easy flushing noted in all ports.  The catheter was sutured in place and sterile dressing applied.  Complications/Tolerance None; patient tolerated the procedure well. Chest X-ray is ordered to verify placement for internal jugular or subclavian cannulation.  Chest x-ray is not ordered for femoral cannulation.  EBL Minimal  Specimen(s) None  Claudia Vega Rust-Chester, AGACNP-BC [facility]    Please see Amion for pager details.

## 2020-06-19 NOTE — Plan of Care (Signed)
  Problem: Clinical Measurements: Goal: Will remain free from infection Outcome: Progressing Goal: Respiratory complications will improve Outcome: Progressing   Problem: Nutrition: Goal: Adequate nutrition will be maintained Outcome: Progressing   Problem: Pain Managment: Goal: General experience of comfort will improve Outcome: Progressing   Problem: Respiratory: Goal: Will maintain a patent airway Outcome: Progressing

## 2020-06-19 NOTE — Progress Notes (Signed)
[facility] Progress Note Patient Name: Claudia Vega DOB: 09/18/1961 MRN: [id number]   Date of Service  06/19/2020  HPI/Events of Note  Hyperkalemia - K+ = 5.9   eICU Interventions  Plan: 1. Lokelma 10 gm per tube now.  2. Repeat BMP at 12 noon.      Intervention Category Major Interventions: Electrolyte abnormality - evaluation and management  Meiya Wisler Cornelia Copa 06/19/2020, 6:37 AM

## 2020-06-19 NOTE — Progress Notes (Signed)
Patient placed on prone position by RT x 2 and RN x4 without complications.  ETT secured with cloth tape at 22 cm in the center.

## 2020-06-19 NOTE — Progress Notes (Addendum)
Inpatient Diabetes Program Recommendations  AACE/ADA: New Consensus Statement on Inpatient Glycemic Control (2015)  Target Ranges:  Prepandial:   less than 140 mg/dL      Peak postprandial:   less than 180 mg/dL (1-2 hours)      Critically ill patients:  140 - 180 mg/dL    Results for SYLEENA, MCHAN (MRN [id number]) as of 06/19/2020 10:55  Ref. Range 06/18/2020 23:32 06/19/2020 03:41 06/19/2020 07:29  Glucose-Capillary Latest Ref Range: 70 - 99 mg/dL 233 (H)  11 units NOVOLOG  227 (H)  11 units NOVOLOG  252 (H)  14 units NOVOLOG +  10 units LANTUS      Current Orders: Lantus 10 units BID      Novolog 0-15 units Q4H      Novolog 6 units Q4H TF coverage       Tradjenta 5 mg Daily     Solumedrol 50 mg BID.   Remains on Vent--TF running 20cc/hr.   Lantus increased 9/26.  Novolog TF coverage added yesterday--1st dose on board at 12pm yesterday.    MD- Please consider the following in-hospital insulin adjustments:  Increase Lantus to 15 units BID      --Will follow patient during hospitalization--  Wyn Quaker RN, MSN, CDE Diabetes Coordinator Inpatient Glycemic Control Team Team Pager: [phone] (8a-5p)

## 2020-06-19 NOTE — Progress Notes (Signed)
Patient placed in supine position to facilitate a new line being placed for CRRT by RT x 2 and RN x 4 without complications.

## 2020-06-19 NOTE — Progress Notes (Signed)
Nutrition Follow-up  DOCUMENTATION CODES:   Not applicable  INTERVENTION:   Vital 1.5 @ 20 ml/hr increase by 10 ml every 8 hours to goal of 55 ml/hr (1320 ml/day) 90 ml ProSource QID Provides: 2300 kcal, 177 grams protein, and 1003 ml free water.   100 ml free water every 3 hours Total free water: 1803 ml    NUTRITION DIAGNOSIS:   Increased nutrient needs related to catabolic illness as evidenced by estimated needs.  Ongoing.   GOAL:   Provide needs based on ASPEN/SCCM guidelines  Not met.   MONITOR:   TF tolerance, Skin, Vent status, Weight trends, Labs, I & O's  REASON FOR ASSESSMENT:   LOS    ASSESSMENT:   59 yo female admitted on 9/05 with COVID-19 pneumonia, devloped worsening respiratory failure due to ARDS requiring transfer to ICU on 9/19. PMH includes asthma, migraine, GERD, DM  Pt with multiple episodes of vomiting while prone. Spoke with NP and MD, started on Reglan 9/27. Will advance TF.  Starting CRRT. Pt with heavy sedation and paralyzed. Off pressors.    9/5 admitted; pt treated for bronchitis x 2 weeks PTA 9/17 epistaxis 9/19 tx to ICU due to worsening hypoxemia  9/24 intubated for severe ARDS; Cortrak placed - tip gastric 9/25 prone/paralyzed 9/26 chest tube placed 9/27 pt with multiple episodes of vomiting; attempted to advance cortrak tube however remains gastric. Started Reglan and trickle TF.  9/28 started CRRT, advancing TF.   Patient is currently intubated on ventilator support MV: 10.6 L/min Temp (24hrs), Avg:97.7 F (36.5 C), Min:96.6 F (35.9 C), Max:99.1 F (37.3 C)   Medications reviewed and include: 500 mg vitamin C daily, SSI, 6 units novolog every 4 hours, 10 units lantus BID, lactulose daily, tradjenta, solumedrol, MVI with minerals, miralax, senokot-s, 220 mg zinc daily  Reglan Fentanyl Versed Vecuronium  Labs reviewed: K+ 5.9 (lokelma given), BUN: 161, Cr: 3.41 CBG's: 226-212-202   Chest tube: 13 ml UOP: 307 ml   I&O: +9 L  NUTRITION - FOCUSED PHYSICAL EXAM:  WNL 9/22  Diet Order:   Diet Order            Diet NPO time specified  Diet effective now                 EDUCATION NEEDS:   Not appropriate for education at this time  Skin:  Skin Assessment: Skin Integrity Issues: Skin Integrity Issues:: Stage II Stage II: R lip  Last BM:  9/27  Height:   Ht Readings from Last 1 Encounters:  06/18/20 _0  (1.499 m)    Weight:   Wt Readings from Last 1 Encounters:  06/18/20 80.5 kg    Ideal Body Weight:     BMI:  Body mass index is 35.84 kg/m.  Estimated Nutritional Needs:   Kcal:  2100-2400  Protein:  155-194 grams  Fluid:  >/= 2 L  Izaiah Tabb P., RD, LDN, CNSC See AMiON for contact information

## 2020-06-20 ENCOUNTER — Inpatient Hospital Stay (HOSPITAL_COMMUNITY): Payer: 59

## 2020-06-20 DIAGNOSIS — Z9689 Presence of other specified functional implants: Secondary | ICD-10-CM

## 2020-06-20 DIAGNOSIS — J9311 Primary spontaneous pneumothorax: Secondary | ICD-10-CM

## 2020-06-20 DIAGNOSIS — L8990 Pressure ulcer of unspecified site, unspecified stage: Secondary | ICD-10-CM | POA: Insufficient documentation

## 2020-06-20 LAB — CBC
HCT: 36.2 % (ref 36.0–46.0)
Hemoglobin: 11.3 g/dL — ABNORMAL LOW (ref 12.0–15.0)
MCH: 28.8 pg (ref 26.0–34.0)
MCHC: 31.2 g/dL (ref 30.0–36.0)
MCV: 92.1 fL (ref 80.0–100.0)
Platelets: 152 10*3/uL (ref 150–400)
RBC: 3.93 MIL/uL (ref 3.87–5.11)
RDW: 12.6 % (ref 11.5–15.5)
WBC: 10.3 10*3/uL (ref 4.0–10.5)
nRBC: 0.7 % — ABNORMAL HIGH (ref 0.0–0.2)

## 2020-06-20 LAB — GLUCOSE, CAPILLARY
Glucose-Capillary: 149 mg/dL — ABNORMAL HIGH (ref 70–99)
Glucose-Capillary: 203 mg/dL — ABNORMAL HIGH (ref 70–99)
Glucose-Capillary: 256 mg/dL — ABNORMAL HIGH (ref 70–99)
Glucose-Capillary: 396 mg/dL — ABNORMAL HIGH (ref 70–99)
Glucose-Capillary: 332 mg/dL — ABNORMAL HIGH (ref 70–99)
Glucose-Capillary: 264 mg/dL — ABNORMAL HIGH (ref 70–99)

## 2020-06-20 LAB — COMPREHENSIVE METABOLIC PANEL
ALT: 90 U/L — ABNORMAL HIGH (ref 0–44)
AST: 33 U/L (ref 15–41)
Albumin: 2.6 g/dL — ABNORMAL LOW (ref 3.5–5.0)
Alkaline Phosphatase: 94 U/L (ref 38–126)
Anion gap: 11 (ref 5–15)
BUN: 88 mg/dL — ABNORMAL HIGH (ref 6–20)
CO2: 24 mmol/L (ref 22–32)
Calcium: 8.4 mg/dL — ABNORMAL LOW (ref 8.9–10.3)
Chloride: 99 mmol/L (ref 98–111)
Creatinine, Ser: 1.89 mg/dL — ABNORMAL HIGH (ref 0.44–1.00)
GFR calc Af Amer: 33 mL/min — ABNORMAL LOW (ref >60)
GFR calc non Af Amer: 28 mL/min — ABNORMAL LOW (ref >60)
Glucose, Bld: 139 mg/dL — ABNORMAL HIGH (ref 70–99)
Potassium: 4.9 mmol/L (ref 3.5–5.1)
Sodium: 134 mmol/L — ABNORMAL LOW (ref 135–145)
Total Bilirubin: 0.6 mg/dL (ref 0.3–1.2)
Total Protein: 6.2 g/dL — ABNORMAL LOW (ref 6.5–8.1)

## 2020-06-20 LAB — RENAL FUNCTION PANEL
Albumin: 2.4 g/dL — ABNORMAL LOW (ref 3.5–5.0)
Anion gap: 8 (ref 5–15)
BUN: 61 mg/dL — ABNORMAL HIGH (ref 6–20)
CO2: 24 mmol/L (ref 22–32)
Calcium: 7.7 mg/dL — ABNORMAL LOW (ref 8.9–10.3)
Chloride: 101 mmol/L (ref 98–111)
Creatinine, Ser: 1.49 mg/dL — ABNORMAL HIGH (ref 0.44–1.00)
GFR calc Af Amer: 44 mL/min — ABNORMAL LOW (ref >60)
GFR calc non Af Amer: 38 mL/min — ABNORMAL LOW (ref >60)
Glucose, Bld: 394 mg/dL — ABNORMAL HIGH (ref 70–99)
Phosphorus: 3.8 mg/dL (ref 2.5–4.6)
Potassium: 4.9 mmol/L (ref 3.5–5.1)
Sodium: 133 mmol/L — ABNORMAL LOW (ref 135–145)

## 2020-06-20 LAB — POCT I-STAT 7, (LYTES, BLD GAS, ICA,H+H)
Acid-base deficit: 2 mmol/L (ref 0.0–2.0)
Bicarbonate: 28.1 mmol/L — ABNORMAL HIGH (ref 20.0–28.0)
Calcium, Ion: 1.21 mmol/L (ref 1.15–1.40)
HCT: 31 % — ABNORMAL LOW (ref 36.0–46.0)
Hemoglobin: 10.5 g/dL — ABNORMAL LOW (ref 12.0–15.0)
O2 Saturation: 96 %
Patient temperature: 96.09
Potassium: 5.1 mmol/L (ref 3.5–5.1)
Sodium: 132 mmol/L — ABNORMAL LOW (ref 135–145)
TCO2: 30 mmol/L (ref 22–32)
pCO2 arterial: 72.6 mmHg (ref 32.0–48.0)
pH, Arterial: 7.189 — CL (ref 7.350–7.450)
pO2, Arterial: 99 mmHg (ref 83.0–108.0)

## 2020-06-20 LAB — PHOSPHORUS: Phosphorus: 4.8 mg/dL — ABNORMAL HIGH (ref 2.5–4.6)

## 2020-06-20 LAB — MAGNESIUM: Magnesium: 3.2 mg/dL — ABNORMAL HIGH (ref 1.7–2.4)

## 2020-06-20 MED ORDER — INSULIN GLARGINE 100 UNIT/ML ~~LOC~~ SOLN
18.0000 [IU] | Freq: Two times a day (BID) | SUBCUTANEOUS | Status: DC
  Administered 2020-06-20 – 2020-06-21 (×3): 18 [IU] via SUBCUTANEOUS
  Filled 2020-06-20 (×5): qty 0.18

## 2020-06-20 MED ORDER — HEPARIN SODIUM (PORCINE) 5000 UNIT/ML IJ SOLN
5000.0000 [IU] | Freq: Three times a day (TID) | INTRAMUSCULAR | Status: DC
  Administered 2020-06-20 – 2020-06-23 (×9): 5000 [IU] via SUBCUTANEOUS
  Filled 2020-06-20 (×9): qty 1

## 2020-06-20 MED ORDER — FREE WATER: 100.0000 mL | Freq: Four times a day (QID) | Status: DC

## 2020-06-20 MED ORDER — INSULIN ASPART 100 UNIT/ML ~~LOC~~ SOLN
0.0000 [IU] | SUBCUTANEOUS | Status: DC
  Administered 2020-06-20: 11 [IU] via SUBCUTANEOUS
  Administered 2020-06-20: 15 [IU] via SUBCUTANEOUS
  Administered 2020-06-20: 20 [IU] via SUBCUTANEOUS
  Administered 2020-06-21: 15 [IU] via SUBCUTANEOUS
  Administered 2020-06-21: 11 [IU] via SUBCUTANEOUS
  Administered 2020-06-21: 7 [IU] via SUBCUTANEOUS
  Administered 2020-06-21 (×2): 15 [IU] via SUBCUTANEOUS
  Administered 2020-06-21: 11 [IU] via SUBCUTANEOUS
  Administered 2020-06-22: 7 [IU] via SUBCUTANEOUS
  Administered 2020-06-22: 11 [IU] via SUBCUTANEOUS
  Administered 2020-06-22: 7 [IU] via SUBCUTANEOUS
  Administered 2020-06-22 (×3): 11 [IU] via SUBCUTANEOUS
  Administered 2020-06-23 (×2): 4 [IU] via SUBCUTANEOUS

## 2020-06-20 NOTE — Progress Notes (Signed)
Assisted tele visit to patient with husband.  Claudia Vega M Claudia Tamargo, RN   

## 2020-06-20 NOTE — Progress Notes (Signed)
Patient ID: Claudia Vega, female   DOB: 12-05-1960, 59 y.o.   MRN: [id number] [facility] Progress Note   Assessment/ Plan:   1.  Acute kidney injury: Oliguric. Suspect ischemic ATN from relative hypotension arising from hemodynamic effects of pneumothorax and worsening respiratory failure in setting of COVID-19 pneumonia- corroborated by urine electrolytes.    She has been tolerating CRRT overnight with significant improvement of labs and ultrafiltration ranging 50 to 100 cc/h.  Net -0.6 L.   2.  COVID-19 pneumonia with severe ARDS: Status post chest tube placement for pneumothorax with ongoing ventilator adjustments per CCM. 3.  Hypertension:  Blood pressures within acceptable range, continues to remain off of HCTZ.  On diltiazem 4.  Hyperkalemia:  Corrected with CRRT following poor response to furosemide and Lokelma. 5.  Anemia of critical illness: With some losses associated with chest tube, continue to monitor for PRBC transfusion needs. Subjective:   Without any significant acute events overnight, tolerating CRRT without problems.   Objective:   BP 128/67   Pulse 85   Temp (!) 96 F (35.6 C) (Rectal)   Resp (!) 35   Ht _0  (1.499 m)   Wt 90.4 kg   SpO2 98%   BMI 40.25 kg/m   Intake/Output Summary (Last 24 hours) at 06/20/2020 0820 Last data filed at 06/20/2020 0800 Gross per 24 hour  Intake 3215.42 ml  Output 3883 ml  Net -667.58 ml   Weight change:   Physical Exam: Patient not personally examined (to limit contact with COVID-19 patient and preserve PPE) and pertinent aspects of physical findings discussed with bedside RN.  Imaging: DG CHEST PORT 1 VIEW  Result Date: 06/19/2020 CLINICAL DATA:  Central line placement EXAM: PORTABLE CHEST 1 VIEW COMPARISON:  06/17/2020 FINDINGS: Endotracheal tube, feeding catheter, left jugular central line and right temporary dialysis catheter are noted in satisfactory position. No pneumothorax is noted on the right  following central line placement. Persistent bibasilar opacities are again seen. Pigtail catheter is again noted on the left with mild residual pneumothorax identified. The overall appearance is similar to that seen on the prior exam. The more inferior chest tube on the left has been removed in the interval. IMPRESSION: No new right-sided pneumothorax is noted dental fide following central line placement. Persistent bibasilar opacities. Chest tube removal on the left inferiorly. Small residual pneumothorax is noted. Electronically Signed   By: Inez Catalina M.D.   On: 06/19/2020 14:10    Labs: [employer] Recent Labs  Lab 06/15/20 0337 06/15/20 0919 06/15/20 2140 06/16/20 0746 06/16/20 0746 06/16/20 1840 06/17/20 0308 06/17/20 0745 06/17/20 1114 06/18/20 0331 06/18/20 0331 06/18/20 1516 06/18/20 1530 06/19/20 0409 06/19/20 0908 06/19/20 1532 06/19/20 1644 06/20/20 0424  NA 139   < >  --  143   < >  --   --  144   < > 141  --  139  --  138 136 135 137 134*  K 4.6   < >  --  5.7*   < >  --   --  4.7   < > 5.2*   < > 5.5* 5.5* 5.9* 5.4* 5.5* 5.5* 4.9  CL 104  --   --  105  --   --   --  107  --  107  --   --   --  102  --   --  103 99  CO2 22  --   --  20*  --   --   --  24  --  25  --   --   --  25  --   --  23 24  GLUCOSE 194*  --   --  212*  --   --   --  214*  --  263*  --   --   --  251*  --   --  258* 139*  BUN 34*  --   --  59*  --   --   --  105*  --  129*  --   --   --  161*  --   --  178* 88*  CREATININE 0.82  --   --  1.61*  --   --   --  2.40*  --  2.75*  --   --   --  3.41*  --   --  3.55* 1.89*  CALCIUM 9.2  --   --  8.7*  --   --   --  8.7*  --  8.7*  --   --   --  9.0  --   --  8.9 8.4*  PHOS 4.5  --  11.9* 9.6*  --  5.5* 6.5*  --   --   --   --   --   --   --   --   --  6.4* 4.8*   < > = values in this interval not displayed.   CBC Recent Labs  Lab 06/16/20 0746 06/16/20 0746 06/17/20 0745 06/17/20 1114 06/18/20 0331 06/18/20 1516 06/19/20 0409 06/19/20 0908  06/19/20 1532 06/20/20 0424  WBC 28.9*   < > 18.7*  --  14.8*  --  10.0  --   --  10.3  NEUTROABS 27.1*  --   --   --   --   --   --   --   --   --   HGB 14.2   < > 12.2   < > 11.5*   < > 10.3* 9.9* 9.5* 11.3*  HCT 46.7*   < > 40.5   < > 38.3   < > 33.2* 29.0* 28.0* 36.2  MCV 95.9   < > 96.4  --  96.2  --  96.2  --   --  92.1  PLT 307   < > 160  --  150  --  125*  --   --  152   < > = values in this interval not displayed.    Medications:    . artificial tears  1 application Both Eyes O4H  . vitamin C  500 mg Per Tube Daily  . chlorhexidine gluconate (MEDLINE KIT)  15 mL Mouth Rinse BID  . Chlorhexidine Gluconate Cloth  6 each Topical Daily  . diltiazem  30 mg Per Tube Q6H  . enoxaparin (LOVENOX) injection  30 mg Subcutaneous Q24H  . feeding supplement (PROSource TF)  90 mL Per Tube QID  . feeding supplement (VITAL 1.5 CAL)  1,000 mL Per Tube Q24H  . free water  100 mL Per Tube Q3H  . insulin aspart  0-15 Units Subcutaneous Q4H  . insulin aspart  6 Units Subcutaneous Q4H  . insulin glargine  12 Units Subcutaneous BID  . lactulose  20 g Per Tube Daily  . linagliptin  5 mg Per Tube Daily  . mouth rinse  15 mL Mouth Rinse 10 times per day  . methylPREDNISolone (SOLU-MEDROL) injection  50 mg Intravenous Q12H  . metoCLOPramide (REGLAN) injection  5 mg Intravenous Q8H  . montelukast  10 mg Per Tube QHS  . multivitamin with minerals  1 tablet Per Tube Daily  . polyethylene glycol  17 g Per Tube Daily  . pramipexole  1 mg Per Tube QHS  . senna-docusate  2 tablet Per Tube BID  . sodium chloride flush  10-40 mL Intracatheter Q12H  . zinc sulfate  220 mg Per Tube Daily   Elmarie Shiley, MD 06/20/2020, 8:20 AM

## 2020-06-20 NOTE — Progress Notes (Signed)
RT note. Pt head turned Lt to Rt without any complications, able to pass catheter ETT.

## 2020-06-20 NOTE — Progress Notes (Signed)
NAME:  Claudia Vega, MRN:  [id number], DOB:  07-15-1961, LOS: 24 ADMISSION DATE:  06/01/2020, CONSULTATION DATE: September 19 REFERRING MD: Dr. Noah Delaine, CHIEF COMPLAINT: Dyspnea  Brief History   59 year old female admitted for Covid pneumonia on May 27, 2020 treated with remdesivir, Solu-Medrol, baricitinib developed worsening hypoxemia on June 10, 2020 so pulmonary and critical care medicine was consulted.  Has been on CPAP alternating with high flow nasal cannula/nonrebreather mask. Intubated 9/24  Past Medical History  Asthma Migraine GERD  Significant Hospital Events   9/5 admission 9/17 epistaxis while on full dose anticoagulation, adjusted back to prophylactic dosing 9/19 transfer to ICU 9/24 intubated  Consults:  PCCM  Procedures:  ETT 9/24 > Left A-line 9/24 > Left IJ 9/25 > Left chest tube 9/26  Significant Diagnostic Tests:  9/15 CTA chest images independently reviewed showing no pulmonary embolism, diffuse bilateral airspace disease  Micro Data:  9/5 SARS-CoV-2 positive 9/5 blood culture negative  Antimicrobials:  9/5 remdesivir> 9/9 9/5 Solu-Medrol> tapered to prednisone current 9/5 baricitinib> 9/18  Interim history/subjective:  Proned overnight. Remains quite hypoxemic. Tolerating CRRT.   Objective   Blood pressure (!) 118/50, pulse 74, temperature (!) 96.3 F (35.7 C), temperature source Rectal, resp. rate (!) 35, height 4\' 11"  (1.499 m), weight 90.4 kg, SpO2 100 %.    Vent Mode: PRVC FiO2 (%):  [100 %] 100 % Set Rate:  [35 bmp] 35 bmp Vt Set:  [350 mL] 350 mL PEEP:  [12 cmH20] 12 cmH20 Plateau Pressure:  [31 cmH20-39 cmH20] 33 cmH20   Intake/Output Summary (Last 24 hours) at 06/20/2020 1144 Last data filed at 06/20/2020 1100 Gross per 24 hour  Intake 3559.01 ml  Output 4624 ml  Net -1064.99 ml   Filed Weights   06/17/20 0309 06/18/20 0331 06/20/20 0433  Weight: 77.4 kg 80.5 kg 90.4 kg    Examination: General: Middle aged  female, obese, on vent.  HEENT: ETT, secure and intact,  MM pink/moist, PERRL  Neuro: Deeply sedated and paralyzed CV: RRR, no MRG PULM: Clear bilateral breath sounds. GI: Soft, non-distended.  Extremities: warm/dry, nonpitting lower extremity edema  Skin: no rashes or lesions, warm dry and intact  Resolved Hospital Problem list   Epistaxis Circulatory vs Septic Shock - vasopressor stopped 9/25  Assessment & Plan:   Acute hypoxic/hypercapnic respiratory failure and sepsis (POA) due to severe ARDS from COVID19 pneumonia P: Continue ventilator support with lung protective strategies per ARDS protocol  Permissive hypercapnia, pH goal 7.2 or better. Head of bed elevated 30 degrees. Plateau pressures goal less than 30 cm H2O Driving pressure goal less than 15 cm H2O VAP bundle in place  Ongoing NMB with vecuronium and prone positioning for P/F ratio less than 150.  PAD protocol; continuous Fentanyl, Versed, and Vecuronium. CRRT for volume removal Remains quite hypercarbic, but rate and Vt are maximized already.    Left Apical Pneumothorax Chest tube placed 9/26 with sub-optimal resolution Minimal output 1 chamber leak P: Routine chest tube care  Flush pigtail chest tube  Follow CXR  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 P: Continue SSI (Moderate coverage) and Lantus and Tradjenta Increase tube feed coverage CBG q4hrs   HTN -Home medications include Diltiazem and HCTZ P: Continue oral diltiazem Hold home HCTZ  Acute Urinary retention with oliguric AKI -in the setting of likely ATN in the setting of COVID/shock . Creatine has doubled since 9/24 currently 3.41 with worsening renal output P: CRRT per nephrology Follow chemistries.  Best practice:  Diet: tube feeds Pain/Anxiety/Delirium protocol (if indicated): deep sedation as above VAP protocol (if indicated): n/a DVT prophylaxis: lovenox GI prophylaxis: n/a Glucose control: Lantus and  SSI Mobility: bed rest Code Status: full Family Communication: Will update husband again over the phone 9/28 Disposition: to ICU  Labs   CBC: Recent Labs  Lab 06/16/20 0746 06/16/20 0746 06/17/20 0745 06/17/20 1114 06/18/20 0331 06/18/20 1516 06/19/20 0409 06/19/20 0908 06/19/20 1532 06/20/20 0424 06/20/20 1113  WBC 28.9*  --  18.7*  --  14.8*  --  10.0  --   --  10.3  --   NEUTROABS 27.1*  --   --   --   --   --   --   --   --   --   --   HGB 14.2   < > 12.2   < > 11.5*   < > 10.3* 9.9* 9.5* 11.3* 10.5*  HCT 46.7*   < > 40.5   < > 38.3   < > 33.2* 29.0* 28.0* 36.2 31.0*  MCV 95.9  --  96.4  --  96.2  --  96.2  --   --  92.1  --   PLT 307  --  160  --  150  --  125*  --   --  152  --    < > = values in this interval not displayed.    Basic Metabolic Panel: Recent Labs  Lab 06/15/20 0337 06/15/20 2140 06/16/20 0746 06/16/20 0746 06/16/20 1840 06/17/20 0308 06/17/20 0745 06/17/20 1114 06/18/20 0331 06/18/20 1516 06/19/20 0409 06/19/20 0409 06/19/20 0908 06/19/20 1532 06/19/20 1644 06/20/20 0424 06/20/20 1113  NA   < >  --  143   < >  --   --  144   < > 141   < > 138   < > 136 135 137 134* 132*  K   < >  --  5.7*   < >  --   --  4.7   < > 5.2*   < > 5.9*   < > 5.4* 5.5* 5.5* 4.9 5.1  CL   < >  --  105   < >  --   --  107  --  107  --  102  --   --   --  103 99  --   CO2   < >  --  20*   < >  --   --  24  --  25  --  25  --   --   --  23 24  --   GLUCOSE   < >  --  212*   < >  --   --  214*  --  263*  --  251*  --   --   --  258* 139*  --   BUN   < >  --  59*   < >  --   --  105*  --  129*  --  161*  --   --   --  178* 88*  --   CREATININE   < >  --  1.61*   < >  --   --  2.40*  --  2.75*  --  3.41*  --   --   --  3.55* 1.89*  --   CALCIUM   < >  --  8.7*   < >  --   --  8.7*  --  8.7*  --  9.0  --   --   --  8.9 8.4*  --   MG  --  3.0* 2.9*  --  2.7* 3.3*  --   --   --   --   --   --   --   --   --  3.2*  --   PHOS   < > 11.9* 9.6*  --  5.5* 6.5*  --   --   --    --   --   --   --   --  6.4* 4.8*  --    < > = values in this interval not displayed.   GFR: Estimated Creatinine Clearance: 31 mL/min (A) (by C-G formula based on SCr of 1.89 mg/dL (H)). Recent Labs  Lab 06/17/20 0745 06/18/20 0331 06/19/20 0409 06/20/20 0424  WBC 18.7* 14.8* 10.0 10.3    Liver Function Tests: Recent Labs  Lab 06/15/20 0337 06/19/20 1644 06/20/20 0424  AST 24  --  33  ALT 34  --  90*  ALKPHOS 86  --  94  BILITOT 0.8  --  0.6  PROT 6.2*  --  6.2*  ALBUMIN 2.4* 2.2* 2.6*   No results for input(s): LIPASE, AMYLASE in the last 168 hours. No results for input(s): AMMONIA in the last 168 hours.  ABG    Component Value Date/Time   PHART 7.189 (LL) 06/20/2020 1113   PCO2ART 72.6 (HH) 06/20/2020 1113   PO2ART 99 06/20/2020 1113   HCO3 28.1 (H) 06/20/2020 1113   TCO2 30 06/20/2020 1113   ACIDBASEDEF 2.0 06/20/2020 1113   O2SAT 96.0 06/20/2020 1113     Coagulation Profile: No results for input(s): INR, PROTIME in the last 168 hours.  Cardiac Enzymes: No results for input(s): CKTOTAL, CKMB, CKMBINDEX, TROPONINI in the last 168 hours.  HbA1C: Hemoglobin A1C  Date/Time Value Ref Range Status  07/10/2012 06:44 PM 5.8  Final   Hgb A1c MFr Bld  Date/Time Value Ref Range Status  05/28/2020 04:00 AM 6.9 (H) 4.8 - 5.6 % Final    Comment:    (NOTE) Pre diabetes:          5.7%-6.4%  Diabetes:              >6.4%  Glycemic control for   <7.0% adults with diabetes     CBG: Recent Labs  Lab 06/19/20 1945 06/19/20 2345 06/20/20 0410 06/20/20 0751 06/20/20 1107  GLUCAP 206* 206* 149* 203* 256*    Critical care time 45 mins  Georgann Housekeeper, AGACNP-BC [place] for personal pager PCCM on call pager [phone]  06/20/2020 11:44 AM

## 2020-06-20 NOTE — Progress Notes (Signed)
Assisted tele visit to patient with family member.  Dreanna Kyllo D Jenesa Foresta, RN   

## 2020-06-20 NOTE — Progress Notes (Signed)
Patient's head and arms repositioned at this time without complications.

## 2020-06-20 NOTE — Progress Notes (Signed)
Patient's head and arms repositioned without complications.

## 2020-06-20 NOTE — Progress Notes (Signed)
Patient placed in supine position by RT x 1 and RN x 5 without complications.  ETT re-secured with a commercial tube holder 22 cm at the lip.

## 2020-06-20 NOTE — Progress Notes (Signed)
Patient placed in prone position by RT x 1 and RN x 4 without complications.  ETT secured with cloth tape 22 cm in the center with foam padding on the cheeks and above the upper lip prior to proning.

## 2020-06-20 NOTE — Progress Notes (Signed)
Inpatient Diabetes Program Recommendations  AACE/ADA: New Consensus Statement on Inpatient Glycemic Control (2015)  Target Ranges:  Prepandial:   less than 140 mg/dL      Peak postprandial:   less than 180 mg/dL (1-2 hours)      Critically ill patients:  140 - 180 mg/dL   Lab Results  Component Value Date   GLUCAP 256 (H) 06/20/2020   HGBA1C 6.9 (H) 05/28/2020    Review of Glycemic Control Results for Claudia Vega, Claudia Vega (MRN [id number]) as of 06/20/2020 11:48  Ref. Range 06/19/2020 19:45 06/19/2020 23:45 06/20/2020 04:10 06/20/2020 07:51 06/20/2020 11:07  Glucose-Capillary Latest Ref Range: 70 - 99 mg/dL 206 (H) 206 (H) 149 (H) 203 (H) 256 (H)   Current Orders: Lantus 12 units BID                            Novolog 0-15 units Q4H                            Novolog 6 units Q4H TF coverage                             Tradjenta 5 mg Daily                            Solumedrol 50 mg Q12H                            Vital 1.5 @ 26ml/hr  Inpatient Diabetes Program Recommendations:     Please consider,  Novolog 8 units q4h tube feed coverage.  Stop if feeds are held or discontinued.  Will continue to follow while inpatient.  Thank you, Reche Dixon, RN, BSN Diabetes Coordinator Inpatient Diabetes Program [phone] (team pager from 8a-5p)

## 2020-06-20 NOTE — Progress Notes (Signed)
RT note. Pt. Head turned Rt to Lt without any complications. Cloth tape is soiled with secretions/ dried blood.

## 2020-06-21 ENCOUNTER — Inpatient Hospital Stay (HOSPITAL_COMMUNITY): Payer: 59

## 2020-06-21 DIAGNOSIS — J939 Pneumothorax, unspecified: Secondary | ICD-10-CM

## 2020-06-21 LAB — RENAL FUNCTION PANEL
Albumin: 2.7 g/dL — ABNORMAL LOW (ref 3.5–5.0)
Albumin: 2.6 g/dL — ABNORMAL LOW (ref 3.5–5.0)
Anion gap: 9 (ref 5–15)
Anion gap: 7 (ref 5–15)
BUN: 43 mg/dL — ABNORMAL HIGH (ref 6–20)
BUN: 39 mg/dL — ABNORMAL HIGH (ref 6–20)
CO2: 26 mmol/L (ref 22–32)
CO2: 24 mmol/L (ref 22–32)
Calcium: 8.3 mg/dL — ABNORMAL LOW (ref 8.9–10.3)
Calcium: 8.4 mg/dL — ABNORMAL LOW (ref 8.9–10.3)
Chloride: 98 mmol/L (ref 98–111)
Chloride: 103 mmol/L (ref 98–111)
Creatinine, Ser: 1.22 mg/dL — ABNORMAL HIGH (ref 0.44–1.00)
Creatinine, Ser: 1.18 mg/dL — ABNORMAL HIGH (ref 0.44–1.00)
GFR calc Af Amer: 56 mL/min — ABNORMAL LOW (ref >60)
GFR calc Af Amer: 58 mL/min — ABNORMAL LOW (ref >60)
GFR calc non Af Amer: 48 mL/min — ABNORMAL LOW (ref >60)
GFR calc non Af Amer: 50 mL/min — ABNORMAL LOW (ref >60)
Glucose, Bld: 312 mg/dL — ABNORMAL HIGH (ref 70–99)
Glucose, Bld: 267 mg/dL — ABNORMAL HIGH (ref 70–99)
Phosphorus: 3.2 mg/dL (ref 2.5–4.6)
Phosphorus: 3.1 mg/dL (ref 2.5–4.6)
Potassium: 4.8 mmol/L (ref 3.5–5.1)
Potassium: 5.2 mmol/L — ABNORMAL HIGH (ref 3.5–5.1)
Sodium: 133 mmol/L — ABNORMAL LOW (ref 135–145)
Sodium: 134 mmol/L — ABNORMAL LOW (ref 135–145)

## 2020-06-21 LAB — POCT I-STAT 7, (LYTES, BLD GAS, ICA,H+H)
Acid-base deficit: 4 mmol/L — ABNORMAL HIGH (ref 0.0–2.0)
Bicarbonate: 27 mmol/L (ref 20.0–28.0)
Calcium, Ion: 1.24 mmol/L (ref 1.15–1.40)
HCT: 34 % — ABNORMAL LOW (ref 36.0–46.0)
Hemoglobin: 11.6 g/dL — ABNORMAL LOW (ref 12.0–15.0)
O2 Saturation: 73 %
Patient temperature: 97.8
Potassium: 5 mmol/L (ref 3.5–5.1)
Sodium: 133 mmol/L — ABNORMAL LOW (ref 135–145)
TCO2: 29 mmol/L (ref 22–32)
pCO2 arterial: 81.2 mmHg (ref 32.0–48.0)
pH, Arterial: 7.127 — CL (ref 7.350–7.450)
pO2, Arterial: 51 mmHg — ABNORMAL LOW (ref 83.0–108.0)

## 2020-06-21 LAB — GLUCOSE, CAPILLARY
Glucose-Capillary: 243 mg/dL — ABNORMAL HIGH (ref 70–99)
Glucose-Capillary: 285 mg/dL — ABNORMAL HIGH (ref 70–99)
Glucose-Capillary: 309 mg/dL — ABNORMAL HIGH (ref 70–99)
Glucose-Capillary: 301 mg/dL — ABNORMAL HIGH (ref 70–99)
Glucose-Capillary: 309 mg/dL — ABNORMAL HIGH (ref 70–99)

## 2020-06-21 LAB — CBC
HCT: 37.1 % (ref 36.0–46.0)
Hemoglobin: 11.3 g/dL — ABNORMAL LOW (ref 12.0–15.0)
MCH: 28.8 pg (ref 26.0–34.0)
MCHC: 30.5 g/dL (ref 30.0–36.0)
MCV: 94.4 fL (ref 80.0–100.0)
Platelets: 121 10*3/uL — ABNORMAL LOW (ref 150–400)
RBC: 3.93 MIL/uL (ref 3.87–5.11)
RDW: 12.9 % (ref 11.5–15.5)
WBC: 8.3 10*3/uL (ref 4.0–10.5)
nRBC: 1.1 % — ABNORMAL HIGH (ref 0.0–0.2)

## 2020-06-21 LAB — MAGNESIUM: Magnesium: 2.6 mg/dL — ABNORMAL HIGH (ref 1.7–2.4)

## 2020-06-21 MED ORDER — INSULIN ASPART 100 UNIT/ML ~~LOC~~ SOLN
10.0000 [IU] | SUBCUTANEOUS | Status: DC
  Administered 2020-06-21 – 2020-06-23 (×12): 10 [IU] via SUBCUTANEOUS

## 2020-06-21 MED ORDER — METHYLPREDNISOLONE SODIUM SUCC 125 MG IJ SOLR
50.0000 mg | INTRAMUSCULAR | Status: DC
  Administered 2020-06-22 – 2020-06-23 (×2): 50 mg via INTRAVENOUS
  Filled 2020-06-21 (×2): qty 2

## 2020-06-21 NOTE — Progress Notes (Signed)
ABG results w/ profound acidosis and poor O2 sat. FiO2 to 100% and Dr. Shearon Stalls updated>>pt to remain on 100% and d/c prone protocol as it is proving ineffective. Pt's husband updated by Georgann Housekeeper NP before ABG. Touched base with him and further update. He expresses concern that she was adamant before intubation r/t not being on life support. She felt persuaded that she should at least give the vent a chance; he feels she would not want continue indefinitely with heroic measures. He is giving thought to her code status. I have assured him that to convert to a DNR will not change the aggressiveness of her care, but allows her to pass peacefully if her heart fails despite our most sincere efforts.

## 2020-06-21 NOTE — Progress Notes (Signed)
RT note. Pt head turned from Lt to Rt without any complications. Secretions/blood soiled on cloth tape. VS stable.

## 2020-06-21 NOTE — Progress Notes (Signed)
Conversation w/ Dr. Posey Pronto this AM agreed to be aggressive as possible w/ fluid removal. As the day evolved, unable to escalate fluid removal as pt becoming more hemodynamically unstable and requiring restart and titration of levophed.

## 2020-06-21 NOTE — Progress Notes (Signed)
Will send link to husband for tomorrow family conversation at 90  [email or web address]

## 2020-06-21 NOTE — Progress Notes (Signed)
Patient placed in supine position by RT x 2 and RN x 4 without complications.  ETT re-secured at 22 cm on the left with a commercial tube holder.

## 2020-06-21 NOTE — Progress Notes (Signed)
Nutrition Follow-up  DOCUMENTATION CODES:   Not applicable  INTERVENTION:   Vital 1.5 @ 55 ml/hr (1320 ml/day) via Cortrak tube (tip gastric) 90 ml ProSource QID Provides: 2300 kcal, 177 grams protein, and 1003 ml free water.    NUTRITION DIAGNOSIS:   Increased nutrient needs related to catabolic illness as evidenced by estimated needs.  Ongoing.   GOAL:   Provide needs based on ASPEN/SCCM guidelines  Met with TF.   MONITOR:   TF tolerance, Skin, Vent status, Weight trends, Labs, I & O's  REASON FOR ASSESSMENT:   LOS    ASSESSMENT:   59 yo female admitted on 9/05 with COVID-19 pneumonia, devloped worsening respiratory failure due to ARDS requiring transfer to ICU on 9/19. PMH includes asthma, migraine, GERD, DM  Pt heavily sedated and continues to prone per schedule. Per MD poor prognosis. Pt now tolerated TF at goal rate.  Today's chest xray shows cortrak tube tip pointing up again. 9/29 chest xray shows tube tip in distal stomach.  Pt with skin breakdown on lip and ear due to proning.    9/5 admitted; pt treated for bronchitis x 2 weeks PTA 9/17 epistaxis 9/19 tx to ICU due to worsening hypoxemia  9/24 intubated for severe ARDS; Cortrak placed - tip gastric 9/25 prone/paralyzed 9/26 chest tube placed 9/27 pt with multiple episodes of vomiting; attempted to advance cortrak tube however remains gastric. Started Reglan and trickle TF.  9/28 started CRRT, advancing TF.   Patient is currently intubated on ventilator support MV: 12.4 L/min Temp (24hrs), Avg:97.9 F (36.6 C), Min:97.6 F (36.4 C), Max:98.9 F (37.2 C)   Medications reviewed and include: 500 mg vitamin C daily, SSI, 10 units novolog every 4 hours, 18 units lantus BID, lactulose daily, tradjenta, solumedrol, reglan 8 mg every 8 hours, MVI with minerals, miralax, senokot-s, 220 mg zinc daily  Fentanyl Versed Levophed @ 4 mcg Vecuronium  Labs reviewed: Na 133 CBG's: 285-309 - insulin adjusted     Chest tube: 30 ml UOP: 21 ml  I&O: -442 ml  UF: 5451 ml   NUTRITION - FOCUSED PHYSICAL EXAM:  WNL 9/22  Diet Order:   Diet Order            Diet NPO time specified  Diet effective now                 EDUCATION NEEDS:   Not appropriate for education at this time  Skin:  Skin Assessment: Skin Integrity Issues: Skin Integrity Issues:: Stage II Stage II: R lip and R ear  Last BM:  375 ml via rectal tube  Height:   Ht Readings from Last 1 Encounters:  06/21/20 4' 11"  (1.499 m)    Weight:   Wt Readings from Last 1 Encounters:  06/21/20 83.5 kg    Ideal Body Weight:     BMI:  Body mass index is 37.18 kg/m.  Estimated Nutritional Needs:   Kcal:  2100-2400  Protein:  155-194 grams  Fluid:  >/= 2 L  Jassica Zazueta P., RD, LDN, CNSC See AMiON for contact information

## 2020-06-21 NOTE — Progress Notes (Signed)
Assisted tele visit to patient with family member.  Claudia Gotay McEachran, RN  

## 2020-06-21 NOTE — Progress Notes (Signed)
Inpatient Diabetes Program Recommendations  AACE/ADA: New Consensus Statement on Inpatient Glycemic Control (2015)  Target Ranges:  Prepandial:   less than 140 mg/dL      Peak postprandial:   less than 180 mg/dL (1-2 hours)      Critically ill patients:  140 - 180 mg/dL   Lab Results  Component Value Date   GLUCAP 285 (H) 06/21/2020   HGBA1C 6.9 (H) 05/28/2020    Review of Glycemic Control Results for Claudia Vega, Claudia Vega (MRN [id number]) as of 06/21/2020 09:57  Ref. Range 06/20/2020 15:18 06/20/2020 19:27 06/20/2020 23:09 06/21/2020 03:00 06/21/2020 08:03  Glucose-Capillary Latest Ref Range: 70 - 99 mg/dL 396 (H) 332 (H) 264 (H) 243 (H) 285 (H)   Ordered:  Lantus 18 units bid Novolog 0-20 units q4h Novolog 6 units q4h Tradjenta 5 mg daily Solumedrol 50 mg q12h Vital 1.5 a@ 34ml/hr  Inpatient Diabetes Program Recommendations:     Noted MD increased Lantus and correction last evening.  CBG's still elevated.  Please consider,  Novolog 10 units Q4H tube feed coverage.  Stop if feeds are held or discontinued.    Will continue to follow while inpatient.  Thank you, Reche Dixon, RN, BSN Diabetes Coordinator Inpatient Diabetes Program [phone] (team pager from 8a-5p)

## 2020-06-21 NOTE — Progress Notes (Addendum)
NAME:  Claudia Vega, MRN:  [id number], DOB:  1961-08-02, LOS: 47 ADMISSION DATE:  06/14/2020, CONSULTATION DATE: September 19 REFERRING MD: Dr. Noah Delaine, CHIEF COMPLAINT: Dyspnea  Brief History   59 year old female admitted for Covid pneumonia on May 27, 2020 treated with remdesivir, Solu-Medrol, baricitinib developed worsening hypoxemia on June 10, 2020 so pulmonary and critical care medicine was consulted.  Has been on CPAP alternating with high flow nasal cannula/nonrebreather mask. Intubated 9/24  Past Medical History  Asthma Migraine GERD  Significant Hospital Events   9/5 admission 9/17 epistaxis while on full dose anticoagulation, adjusted back to prophylactic dosing 9/19 transfer to ICU 9/24 intubated  Consults:  PCCM  Procedures:  ETT 9/24 > Left A-line 9/24 > Left IJ 9/25 > Left chest tube 9/26  Significant Diagnostic Tests:  9/15 CTA chest images independently reviewed showing no pulmonary embolism, diffuse bilateral airspace disease  Micro Data:  9/5 SARS-CoV-2 positive 9/5 blood culture negative  Antimicrobials:  9/5 remdesivir> 9/9 9/5 Solu-Medrol> tapered to prednisone current 9/5 baricitinib> 9/18  Interim history/subjective:  No acute events overnight. Proning ongoing. Placed supine at 0950  Objective   Blood pressure (!) 123/55, pulse 89, temperature 97.6 F (36.4 C), temperature source Axillary, resp. rate (!) 35, height 4\' 11"  (1.499 m), weight 83.5 kg, SpO2 92 %.    Vent Mode: PRVC FiO2 (%):  [70 %-100 %] 70 % Set Rate:  [35 bmp] 35 bmp Vt Set:  [350 mL] 350 mL PEEP:  [12 cmH20] 12 cmH20 Plateau Pressure:  [26 cmH20-37 cmH20] 35 cmH20   Intake/Output Summary (Last 24 hours) at 06/21/2020 0944 Last data filed at 06/21/2020 0800 Gross per 24 hour  Intake 3184.99 ml  Output 5554 ml  Net -2369.01 ml   Filed Weights   06/18/20 0331 06/20/20 0433 06/21/20 0409  Weight: 80.5 kg 90.4 kg 83.5 kg    Examination:  General:  overweight middle aged female HEENT: [place]/AT, PERRL, no JVD Neuro: sedated, paralyzed CV: RRR, no MRG PULM: Clear bilateral breath sounds GI: Soft, non-distended.  Extremities: warm/dry, nonpitting lower extremity edema  Skin: no rashes or lesions, warm dry and intact  Resolved Hospital Problem list   Epistaxis Circulatory vs Septic Shock - vasopressor stopped 9/25  Assessment & Plan:   Acute hypoxic/hypercapnic respiratory failure and sepsis (POA) due to severe ARDS from COVID19 pneumonia  P: Continue ventilator support with lung protective strategies per ARDS protocol Permissive hypercapnia, pH goal 7.2 or better Head of bed elevated 30 degrees Plateau pressures goal less than 30 cm H2O Driving pressure goal less than 15 cm H2O VAP bundle in place Ongoing NMB with vecuronium and prone positioning for P/F ratio less than 150 PAD protocol; continuous Fentanyl, Versed, and Vecuronium. CRRT for volume removal Remains quite hypercarbic, but rate and Vt are maximized already.  Check ABG in supine position  Left Apical Pneumothorax Chest tube placed 9/26 with sub-optimal resolution Minimal output 2 chamber leak P: Routine chest tube care  Flush pigtail chest tube  Check CXR as some bleeding near CT site, perhaps migrated out.   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 P: Continue SSI (Moderate coverage) and Lantus and Tradjenta Tube feed coverage increased CBG q4hrs  Wean steroids substantially should help  HTN -Home medications include Diltiazem and HCTZ P: Continue oral diltiazem Hold home HCTZ  Acute Urinary retention with oliguric AKI -in the setting of likely ATN in the setting of COVID/shock . Creatine has doubled since 9/24 currently 3.41 with  worsening renal output P: CRRT per nephrology Follow chemistries.  Best practice:  Diet: tube feeds Pain/Anxiety/Delirium protocol (if indicated): deep sedation as above VAP protocol (if  indicated): n/a DVT prophylaxis: lovenox GI prophylaxis: n/a Glucose control: Lantus and SSI Mobility: bed rest Code Status: full Family Communication: Will update husband Disposition: to ICU  Labs   CBC: Recent Labs  Lab 06/16/20 0746 06/16/20 0746 06/17/20 0745 06/17/20 1114 06/18/20 0331 06/18/20 1516 06/19/20 0409 06/19/20 0409 06/19/20 0908 06/19/20 1532 06/20/20 0424 06/20/20 1113 06/21/20 0425  WBC 28.9*   < > 18.7*  --  14.8*  --  10.0  --   --   --  10.3  --  8.3  NEUTROABS 27.1*  --   --   --   --   --   --   --   --   --   --   --   --   HGB 14.2   < > 12.2   < > 11.5*   < > 10.3*   < > 9.9* 9.5* 11.3* 10.5* 11.3*  HCT 46.7*   < > 40.5   < > 38.3   < > 33.2*   < > 29.0* 28.0* 36.2 31.0* 37.1  MCV 95.9   < > 96.4  --  96.2  --  96.2  --   --   --  92.1  --  94.4  PLT 307   < > 160  --  150  --  125*  --   --   --  152  --  121*   < > = values in this interval not displayed.    Basic Metabolic Panel: Recent Labs  Lab 06/16/20 0746 06/16/20 0746 06/16/20 1840 06/16/20 1840 06/17/20 0308 06/17/20 0745 06/19/20 0409 06/19/20 0908 06/19/20 1644 06/20/20 0424 06/20/20 1113 06/20/20 1644 06/21/20 0425 06/21/20 0426  NA 143  --   --   --   --    < > 138   < > 137 134* 132* 133*  --  133*  K 5.7*  --   --   --   --    < > 5.9*   < > 5.5* 4.9 5.1 4.9  --  4.8  CL 105  --   --   --   --    < > 102  --  103 99  --  101  --  98  CO2 20*  --   --   --   --    < > 25  --  23 24  --  24  --  26  GLUCOSE 212*  --   --   --   --    < > 251*  --  258* 139*  --  394*  --  312*  BUN 59*  --   --   --   --    < > 161*  --  178* 88*  --  61*  --  43*  CREATININE 1.61*  --   --   --   --    < > 3.41*  --  3.55* 1.89*  --  1.49*  --  1.22*  CALCIUM 8.7*  --   --   --   --    < > 9.0  --  8.9 8.4*  --  7.7*  --  8.3*  MG 2.9*  --  2.7*  --  3.3*  --   --   --   --  3.2*  --   --  2.6*  --   PHOS 9.6*   < > 5.5*   < > 6.5*  --   --   --  6.4* 4.8*  --  3.8  --  3.2   < >  = values in this interval not displayed.   GFR: Estimated Creatinine Clearance: 45.9 mL/min (A) (by C-G formula based on SCr of 1.22 mg/dL (H)). Recent Labs  Lab 06/18/20 0331 06/19/20 0409 06/20/20 0424 06/21/20 0425  WBC 14.8* 10.0 10.3 8.3    Liver Function Tests: Recent Labs  Lab 06/15/20 0337 06/19/20 1644 06/20/20 0424 06/20/20 1644 06/21/20 0426  AST 24  --  33  --   --   ALT 34  --  90*  --   --   ALKPHOS 86  --  94  --   --   BILITOT 0.8  --  0.6  --   --   PROT 6.2*  --  6.2*  --   --   ALBUMIN 2.4* 2.2* 2.6* 2.4* 2.7*   No results for input(s): LIPASE, AMYLASE in the last 168 hours. No results for input(s): AMMONIA in the last 168 hours.  ABG    Component Value Date/Time   PHART 7.189 (LL) 06/20/2020 1113   PCO2ART 72.6 (HH) 06/20/2020 1113   PO2ART 99 06/20/2020 1113   HCO3 28.1 (H) 06/20/2020 1113   TCO2 30 06/20/2020 1113   ACIDBASEDEF 2.0 06/20/2020 1113   O2SAT 96.0 06/20/2020 1113     Coagulation Profile: No results for input(s): INR, PROTIME in the last 168 hours.  Cardiac Enzymes: No results for input(s): CKTOTAL, CKMB, CKMBINDEX, TROPONINI in the last 168 hours.  HbA1C: Hemoglobin A1C  Date/Time Value Ref Range Status  07/10/2012 06:44 PM 5.8  Final   Hgb A1c MFr Bld  Date/Time Value Ref Range Status  05/28/2020 04:00 AM 6.9 (H) 4.8 - 5.6 % Final    Comment:    (NOTE) Pre diabetes:          5.7%-6.4%  Diabetes:              >6.4%  Glycemic control for   <7.0% adults with diabetes     CBG: Recent Labs  Lab 06/20/20 1518 06/20/20 1927 06/20/20 2309 06/21/20 0300 06/21/20 0803  GLUCAP 396* 332* 264* 243* 285*    Critical care time 45 mins  Georgann Housekeeper, AGACNP-BC [place] for personal pager PCCM on call pager [phone]  06/21/2020 9:44 AM

## 2020-06-21 NOTE — Progress Notes (Signed)
Patient ID: Claudia Vega, female   DOB: 1961-07-17, 59 y.o.   MRN: [id number] [facility] Progress Note   Assessment/ Plan:   1.  Acute kidney injury: Anuric overnight. Suspect ischemic ATN from relative hypotension arising from hemodynamic effects of pneumothorax and worsening respiratory failure in setting of COVID-19 pneumonia- corroborated by urine electrolytes. Continue current prescription of CRRT with efforts at increasing net ultrafiltration goal to 150 cc/h. 2.  COVID-19 pneumonia with severe ARDS: Status post chest tube placement for pneumothorax with ongoing ventilator adjustments per CCM.  Intermittent prone ventilation. 3.  Hypertension:  Blood pressures within acceptable range, continues to remain off of HCTZ.  On diltiazem 4.  Hyperkalemia:  Corrected with CRRT following poor response to furosemide and Lokelma. 5.  Anemia of critical illness: With some losses associated with chest tube, continue to monitor for PRBC transfusion needs. Subjective:   Without any significant acute events overnight, off pressors and now with decreasing oxygenation requirements with ultrafiltration-we will attempt to increase this to net -159m an hour.   Objective:   BP (!) 123/55   Pulse 89   Temp 97.6 F (36.4 C) (Axillary)   Resp (!) 35   Ht 4' 11"  (1.499 m)   Wt 83.5 kg   SpO2 92%   BMI 37.18 kg/m   Intake/Output Summary (Last 24 hours) at 06/21/2020 0901 Last data filed at 06/21/2020 0800 Gross per 24 hour  Intake 3184.99 ml  Output 5554 ml  Net -2369.01 ml   Weight change: -6.9 kg  Physical Exam: Patient not personally examined (to limit contact with COVID-19 patient and preserve PPE) and pertinent aspects of physical findings discussed with bedside RN.  Imaging: DG CHEST PORT 1 VIEW  Result Date: 06/20/2020 CLINICAL DATA:  Respiratory failure. EXAM: PORTABLE CHEST 1 VIEW COMPARISON:  June 19, 2020. FINDINGS: Stable cardiomediastinal silhouette. Endotracheal  and feeding tubes are unchanged in position. Bilateral jugular catheters are unchanged. Stable position of left-sided chest tube with minimal left apical pneumothorax. Stable subcutaneous emphysema is seen overlying the left supraclavicular and lateral chest wall regions. Stable bibasilar subsegmental atelectasis or infiltrates are noted. Bony thorax is unremarkable. IMPRESSION: Stable support apparatus. Stable position of left-sided chest tube with minimal left apical pneumothorax. Stable bibasilar subsegmental atelectasis or infiltrates. Electronically Signed   By: JMarijo ConceptionM.D.   On: 06/20/2020 09:34   DG CHEST PORT 1 VIEW  Result Date: 06/19/2020 CLINICAL DATA:  Central line placement EXAM: PORTABLE CHEST 1 VIEW COMPARISON:  06/17/2020 FINDINGS: Endotracheal tube, feeding catheter, left jugular central line and right temporary dialysis catheter are noted in satisfactory position. No pneumothorax is noted on the right following central line placement. Persistent bibasilar opacities are again seen. Pigtail catheter is again noted on the left with mild residual pneumothorax identified. The overall appearance is similar to that seen on the prior exam. The more inferior chest tube on the left has been removed in the interval. IMPRESSION: No new right-sided pneumothorax is noted dental fide following central line placement. Persistent bibasilar opacities. Chest tube removal on the left inferiorly. Small residual pneumothorax is noted. Electronically Signed   By: MInez CatalinaM.D.   On: 06/19/2020 14:10    Labs: BDIRECTVRecent Labs  Lab 06/16/20 0746 06/16/20 0746 06/16/20 1840 06/17/20 0308 06/17/20 0745 06/17/20 1114 06/18/20 0331 06/18/20 1516 06/19/20 0409 06/19/20 0409 06/19/20 0908 06/19/20 1532 06/19/20 1644 06/20/20 0424 06/20/20 1113 06/20/20 1644 06/21/20 0426  NA 143   < >  --   --  144   < > 141   < > 138   < > 136 135 137 134* 132* 133* 133*  K 5.7*   < >  --   --  4.7   <  > 5.2*   < > 5.9*   < > 5.4* 5.5* 5.5* 4.9 5.1 4.9 4.8  CL 105   < >  --   --  107  --  107  --  102  --   --   --  103 99  --  101 98  CO2 20*   < >  --   --  24  --  25  --  25  --   --   --  23 24  --  24 26  GLUCOSE 212*   < >  --   --  214*  --  263*  --  251*  --   --   --  258* 139*  --  394* 312*  BUN 59*   < >  --   --  105*  --  129*  --  161*  --   --   --  178* 88*  --  61* 43*  CREATININE 1.61*   < >  --   --  2.40*  --  2.75*  --  3.41*  --   --   --  3.55* 1.89*  --  1.49* 1.22*  CALCIUM 8.7*   < >  --   --  8.7*  --  8.7*  --  9.0  --   --   --  8.9 8.4*  --  7.7* 8.3*  PHOS 9.6*  --  5.5* 6.5*  --   --   --   --   --   --   --   --  6.4* 4.8*  --  3.8 3.2   < > = values in this interval not displayed.   CBC Recent Labs  Lab 06/16/20 0746 06/17/20 0745 06/18/20 0331 06/18/20 1516 06/19/20 0409 06/19/20 0908 06/19/20 1532 06/20/20 0424 06/20/20 1113 06/21/20 0425  WBC 28.9*   < > 14.8*  --  10.0  --   --  10.3  --  8.3  NEUTROABS 27.1*  --   --   --   --   --   --   --   --   --   HGB 14.2   < > 11.5*   < > 10.3*   < > 9.5* 11.3* 10.5* 11.3*  HCT 46.7*   < > 38.3   < > 33.2*   < > 28.0* 36.2 31.0* 37.1  MCV 95.9   < > 96.2  --  96.2  --   --  92.1  --  94.4  PLT 307   < > 150  --  125*  --   --  152  --  121*   < > = values in this interval not displayed.    Medications:    . artificial tears  1 application Both Eyes T7G  . vitamin C  500 mg Per Tube Daily  . chlorhexidine gluconate (MEDLINE KIT)  15 mL Mouth Rinse BID  . Chlorhexidine Gluconate Cloth  6 each Topical Daily  . diltiazem  30 mg Per Tube Q6H  . feeding supplement (PROSource TF)  90 mL Per Tube QID  . feeding supplement (VITAL 1.5 CAL)  1,000 mL Per Tube Q24H  . heparin  injection (subcutaneous)  5,000 Units Subcutaneous Q8H  . insulin aspart  0-20 Units Subcutaneous Q4H  . insulin aspart  6 Units Subcutaneous Q4H  . insulin glargine  18 Units Subcutaneous BID  . lactulose  20 g Per Tube Daily   . linagliptin  5 mg Per Tube Daily  . mouth rinse  15 mL Mouth Rinse 10 times per day  . methylPREDNISolone (SOLU-MEDROL) injection  50 mg Intravenous Q12H  . metoCLOPramide (REGLAN) injection  5 mg Intravenous Q8H  . montelukast  10 mg Per Tube QHS  . multivitamin with minerals  1 tablet Per Tube Daily  . polyethylene glycol  17 g Per Tube Daily  . pramipexole  1 mg Per Tube QHS  . senna-docusate  2 tablet Per Tube BID  . sodium chloride flush  10-40 mL Intracatheter Q12H  . zinc sulfate  220 mg Per Tube Daily   Elmarie Shiley, MD 06/21/2020, 9:01 AM

## 2020-06-21 NOTE — Progress Notes (Signed)
PT Cancellation Note  Patient Details Name: Claudia Vega MRN: [id number] DOB: Aug 30, 1961   Cancelled Treatment:    Reason Eval/Treat Not Completed: Medical issues which prohibited therapy;Patient not medically ready.  06/21/2020  Ginger Carne., PT Acute Rehabilitation Services [phone]  (pager) [phone]  (office)   Tessie Fass [name] 06/21/2020, 11:47 AM

## 2020-06-22 DIAGNOSIS — J80 Acute respiratory distress syndrome: Secondary | ICD-10-CM

## 2020-06-22 DIAGNOSIS — U071 COVID-19: Secondary | ICD-10-CM

## 2020-06-22 LAB — RENAL FUNCTION PANEL
Albumin: 2.7 g/dL — ABNORMAL LOW (ref 3.5–5.0)
Albumin: 2.9 g/dL — ABNORMAL LOW (ref 3.5–5.0)
Anion gap: 8 (ref 5–15)
Anion gap: 10 (ref 5–15)
BUN: 41 mg/dL — ABNORMAL HIGH (ref 6–20)
BUN: 33 mg/dL — ABNORMAL HIGH (ref 6–20)
CO2: 23 mmol/L (ref 22–32)
CO2: 24 mmol/L (ref 22–32)
Calcium: 8.6 mg/dL — ABNORMAL LOW (ref 8.9–10.3)
Calcium: 8.5 mg/dL — ABNORMAL LOW (ref 8.9–10.3)
Chloride: 101 mmol/L (ref 98–111)
Chloride: 101 mmol/L (ref 98–111)
Creatinine, Ser: 1.15 mg/dL — ABNORMAL HIGH (ref 0.44–1.00)
Creatinine, Ser: 0.97 mg/dL (ref 0.44–1.00)
GFR calc Af Amer: 60 mL/min — ABNORMAL LOW (ref >60)
GFR calc Af Amer: >60 mL/min (ref >60)
GFR calc non Af Amer: 52 mL/min — ABNORMAL LOW (ref >60)
GFR calc non Af Amer: >60 mL/min (ref >60)
Glucose, Bld: 267 mg/dL — ABNORMAL HIGH (ref 70–99)
Glucose, Bld: 279 mg/dL — ABNORMAL HIGH (ref 70–99)
Phosphorus: 2.7 mg/dL (ref 2.5–4.6)
Phosphorus: 3.9 mg/dL (ref 2.5–4.6)
Potassium: 5.1 mmol/L (ref 3.5–5.1)
Potassium: 5.1 mmol/L (ref 3.5–5.1)
Sodium: 132 mmol/L — ABNORMAL LOW (ref 135–145)
Sodium: 135 mmol/L (ref 135–145)

## 2020-06-22 LAB — GLUCOSE, CAPILLARY
Glucose-Capillary: 277 mg/dL — ABNORMAL HIGH (ref 70–99)
Glucose-Capillary: 215 mg/dL — ABNORMAL HIGH (ref 70–99)
Glucose-Capillary: 274 mg/dL — ABNORMAL HIGH (ref 70–99)
Glucose-Capillary: 260 mg/dL — ABNORMAL HIGH (ref 70–99)
Glucose-Capillary: 272 mg/dL — ABNORMAL HIGH (ref 70–99)
Glucose-Capillary: 224 mg/dL — ABNORMAL HIGH (ref 70–99)

## 2020-06-22 LAB — POCT I-STAT 7, (LYTES, BLD GAS, ICA,H+H)
Acid-base deficit: 4 mmol/L — ABNORMAL HIGH (ref 0.0–2.0)
Bicarbonate: 26.3 mmol/L (ref 20.0–28.0)
Calcium, Ion: 1.23 mmol/L (ref 1.15–1.40)
HCT: 35 % — ABNORMAL LOW (ref 36.0–46.0)
Hemoglobin: 11.9 g/dL — ABNORMAL LOW (ref 12.0–15.0)
O2 Saturation: 93 %
Patient temperature: 97.9
Potassium: 5.2 mmol/L — ABNORMAL HIGH (ref 3.5–5.1)
Sodium: 131 mmol/L — ABNORMAL LOW (ref 135–145)
TCO2: 29 mmol/L (ref 22–32)
pCO2 arterial: 77.6 mmHg (ref 32.0–48.0)
pH, Arterial: 7.136 — CL (ref 7.350–7.450)
pO2, Arterial: 86 mmHg (ref 83.0–108.0)

## 2020-06-22 LAB — MAGNESIUM: Magnesium: 2.6 mg/dL — ABNORMAL HIGH (ref 1.7–2.4)

## 2020-06-22 MED ORDER — INSULIN GLARGINE 100 UNIT/ML ~~LOC~~ SOLN
20.0000 [IU] | Freq: Two times a day (BID) | SUBCUTANEOUS | Status: DC
  Administered 2020-06-22 (×2): 20 [IU] via SUBCUTANEOUS
  Filled 2020-06-22 (×4): qty 0.2

## 2020-06-22 MED ORDER — VASOPRESSIN 20 UNITS/100 ML INFUSION FOR SHOCK
0.0000 [IU]/min | INTRAVENOUS | Status: DC
  Administered 2020-06-22 – 2020-06-23 (×2): 0.03 [IU]/min via INTRAVENOUS
  Filled 2020-06-22 (×2): qty 100

## 2020-06-22 MED ORDER — PRISMASOL BGK 0/2.5 32-2.5 MEQ/L IV SOLN
INTRAVENOUS | Status: DC
  Filled 2020-06-22 (×11): qty 5000

## 2020-06-22 NOTE — Progress Notes (Addendum)
[facility] Progress Note Patient Name: Claudia Vega DOB: 1961-02-29 MRN: [id number]   Date of Service  06/22/2020  HPI/Events of Note  Some interval increase in levophed requirement, now at 0.42 mcg/kg/min. Escalating requirements have been the pattern for this patient over the past 3 days. On CRRT but not able to pull volume due to pressures at this point. CVP 10. ABG with severe respiratory acidosis despite VT 8cc/kg and RR 35.   eICU Interventions  Ordered vasopressin drip for BP. Prognosis has been discussed with family and they are planning to come in and see her. Code status was changed to DNR earlier.   ADDENDUM: - Further worsening of hypotension. Levophed now at 40 mcg/min + vaso. - Check STAT ABG and CXR (has left chest tube in place for prior PTX).  ADDENDUM: - ABG without significant change. CXR without reaccumulation of PTX. - Added empiric vanc/cefepime. Draw blood cultures. - Will notify the family that she is declining more rapidly at this point.  ADDENDUM: - Spoke to patient's husband Quillian Quince. Informed him of the patient's clinical deterioration and advised him to come to see her early today if possible. He understood and appreciated the call/update.  Intervention Category Intermediate Interventions: Hypotension - evaluation and management  Marily Lente Alfie Rideaux 06/22/2020, 11:11 PM

## 2020-06-22 NOTE — Progress Notes (Signed)
PCCM INTERVAL PROGRESS NOTE   Long discussion with Zoie's family including her husband Linna Hoff, mother, father, and sister. They understand she will not likely survive the admission and possibly not even the next 24-48 hours. They have elected to transition code status to DNR, but continue aggressive medical therapies for the time being. They have requested to come visit, and as she is outside her 21 day COVID precautions window, I have placed a transfer order to alternate ICU to allow patient visitation. Family is aware this may be difficult due to bed availability.     Georgann Housekeeper, AGACNP-BC [place]  See Am[place] for personal pager PCCM on call pager [phone]  06/22/2020 12:04 PM

## 2020-06-22 NOTE — Progress Notes (Signed)
Assisted tele visit to patient with husband.  Aymara Sassi McEachran, RN  

## 2020-06-22 NOTE — Progress Notes (Signed)
Patient ID: Claudia Vega, female   DOB: December 26, 1960, 59 y.o.   MRN: [id number]  [facility] Progress Note   Assessment/ Plan:   1.  Acute kidney injury: Anuric overnight.  Suspected to be from ATN from relative hypotension arising from hemodynamic effects of pneumothorax and worsening respiratory failure in setting of COVID-19 pneumonia- corroborated by urine electrolytes.  I will continue her on the current prescription of CRRT with efforts at increasing net ultrafiltration goal to 150 cc/h and adjust prefilter/post filter fluids to help manage hyperkalemia. 2.  COVID-19 pneumonia with severe ARDS: Status post chest tube placement for pneumothorax with ongoing ventilator adjustments per CCM.  Intermittent prone ventilation. 3.  Hypertension:  Blood pressures soft with ongoing CRRT/ultrafiltration 4.  Hyperkalemia:  Mild, adjust CRRT prescription. 5.  Anemia of critical illness: With some losses associated with chest tube, continue to monitor for PRBC transfusion needs. Subjective:   No acute events reported overnight.  Able to tolerate UF ranging 100-150 cc/hour on CRRT.  Family meeting today to address goals of care.   Objective:   BP 90/68   Pulse 92   Temp 97.7 F (36.5 C) (Oral)   Resp (!) 35   Ht 4' 11"  (1.499 m)   Wt 83 kg   SpO2 99%   BMI 36.96 kg/m   Intake/Output Summary (Last 24 hours) at 06/22/2020 1102 Last data filed at 06/22/2020 1000 Gross per 24 hour  Intake 2853.68 ml  Output 4638 ml  Net -1784.32 ml   Weight change: -0.5 kg  Physical Exam: Patient not personally examined (to limit contact with COVID-19 patient and preserve PPE) and pertinent aspects of physical findings discussed with bedside RN.  Imaging: DG CHEST PORT 1 VIEW  Result Date: 06/21/2020 CLINICAL DATA:  History of pneumothorax. EXAM: PORTABLE CHEST 1 VIEW COMPARISON:  06/20/2020. FINDINGS: Endotracheal tube tip is at the lower portion of the carina. Proximal repositioning  approximately 3 cm suggested. Feeding tube, right and left IJ lines, and left chest tube in stable position. Heart size stable. Persistent bibasilar infiltrates. No pleural effusion. No pneumothorax noted on today's exam. Improved left chest wall subcutaneous emphysema. IMPRESSION: 1. Endotracheal tube tip is at the lower portion of the carina. Proximal repositioning of approximately 3 cm suggested. 2. Remaining lines and tubes including left chest tube in stable position. No pneumothorax noted on today's exam. Improved left chest wall subcutaneous emphysema. 3. Persistent bibasilar infiltrates. These results will be called to the ordering clinician or representative by the Radiologist Assistant, and communication documented in the PACS or [employer]. Electronically Signed   By: Marcello Moores  Register   On: 06/21/2020 10:22    Labs: BMET Recent Labs  Lab 06/17/20 0308 06/17/20 0745 06/19/20 0409 06/19/20 0908 06/19/20 1644 06/19/20 1644 06/20/20 0424 06/20/20 0424 06/20/20 1113 06/20/20 1644 06/21/20 0426 06/21/20 1149 06/21/20 1600 06/22/20 0415 06/22/20 1009  NA  --    < > 138   < > 137   < > 134*   < > 132* 133* 133* 133* 134* 132* 131*  K  --    < > 5.9*   < > 5.5*   < > 4.9   < > 5.1 4.9 4.8 5.0 5.2* 5.1 5.2*  CL  --    < > 102  --  103  --  99  --   --  101 98  --  103 101  --   CO2  --    < > 25  --  23  --  24  --   --  24 26  --  24 23  --   GLUCOSE  --    < > 251*  --  258*  --  139*  --   --  394* 312*  --  267* 267*  --   BUN  --    < > 161*  --  178*  --  88*  --   --  61* 43*  --  39* 41*  --   CREATININE  --    < > 3.41*  --  3.55*  --  1.89*  --   --  1.49* 1.22*  --  1.18* 1.15*  --   CALCIUM  --    < > 9.0  --  8.9  --  8.4*  --   --  7.7* 8.3*  --  8.4* 8.6*  --   PHOS 6.5*  --   --   --  6.4*  --  4.8*  --   --  3.8 3.2  --  3.1 2.7  --    < > = values in this interval not displayed.   CBC Recent Labs  Lab 06/16/20 0746 06/17/20 0745 06/18/20 0331  06/18/20 1516 06/19/20 0409 06/19/20 0908 06/20/20 0424 06/20/20 0424 06/20/20 1113 06/21/20 0425 06/21/20 1149 06/22/20 1009  WBC 28.9*   < > 14.8*  --  10.0  --  10.3  --   --  8.3  --   --   NEUTROABS 27.1*  --   --   --   --   --   --   --   --   --   --   --   HGB 14.2   < > 11.5*   < > 10.3*   < > 11.3*   < > 10.5* 11.3* 11.6* 11.9*  HCT 46.7*   < > 38.3   < > 33.2*   < > 36.2   < > 31.0* 37.1 34.0* 35.0*  MCV 95.9   < > 96.2  --  96.2  --  92.1  --   --  94.4  --   --   PLT 307   < > 150  --  125*  --  152  --   --  121*  --   --    < > = values in this interval not displayed.    Medications:    . artificial tears  1 application Both Eyes J0K  . vitamin C  500 mg Per Tube Daily  . chlorhexidine gluconate (MEDLINE KIT)  15 mL Mouth Rinse BID  . Chlorhexidine Gluconate Cloth  6 each Topical Daily  . feeding supplement (PROSource TF)  90 mL Per Tube QID  . feeding supplement (VITAL 1.5 CAL)  1,000 mL Per Tube Q24H  . heparin injection (subcutaneous)  5,000 Units Subcutaneous Q8H  . insulin aspart  0-20 Units Subcutaneous Q4H  . insulin aspart  10 Units Subcutaneous Q4H  . insulin glargine  20 Units Subcutaneous BID  . linagliptin  5 mg Per Tube Daily  . mouth rinse  15 mL Mouth Rinse 10 times per day  . methylPREDNISolone (SOLU-MEDROL) injection  50 mg Intravenous Q24H  . montelukast  10 mg Per Tube QHS  . multivitamin with minerals  1 tablet Per Tube Daily  . polyethylene glycol  17 g Per Tube Daily  . pramipexole  1 mg Per Tube QHS  .  senna-docusate  2 tablet Per Tube BID  . sodium chloride flush  10-40 mL Intracatheter Q12H  . zinc sulfate  220 mg Per Tube Daily   Elmarie Shiley, MD 06/22/2020, 11:02 AM

## 2020-06-22 NOTE — Progress Notes (Addendum)
NAME:  Claudia Vega, MRN:  [id number], DOB:  02-26-1961, LOS: 2 ADMISSION DATE:  06/19/2020, CONSULTATION DATE: September 19 REFERRING MD: Dr. Noah Delaine, CHIEF COMPLAINT: Dyspnea  Brief History   59 year old female admitted for Covid pneumonia on May 27, 2020 treated with remdesivir, Solu-Medrol, baricitinib developed worsening hypoxemia on June 10, 2020 so pulmonary and critical care medicine was consulted.  Has been on CPAP alternating with high flow nasal cannula/nonrebreather mask. Intubated 9/24  Past Medical History  Asthma Migraine GERD  Significant Hospital Events   9/5 admission 9/17 epistaxis while on full dose anticoagulation, adjusted back to prophylactic dosing 9/19 transfer to ICU 9/24 intubated  Consults:  PCCM  Procedures:  ETT 9/24 > Left A-line 9/24 > Left IJ 9/25 > Left chest tube 9/26  Significant Diagnostic Tests:  9/15 CTA chest images independently reviewed showing no pulmonary embolism, diffuse bilateral airspace disease  Micro Data:  9/5 SARS-CoV-2 positive 9/5 blood culture negative  Antimicrobials:  9/5 remdesivir> 9/9 9/5 Solu-Medrol> tapered to prednisone current 9/5 baricitinib> 9/18  Interim history/subjective:  No acute events overnight. No benefit from prone positioning.   Objective   Blood pressure 92/66, pulse 68, temperature 97.7 F (36.5 C), temperature source Oral, resp. rate (!) 35, height 4\' 11"  (1.499 m), weight 83 kg, SpO2 100 %.    Vent Mode: PRVC FiO2 (%):  [70 %-100 %] 90 % Set Rate:  [35 bmp] 35 bmp Vt Set:  [350 mL] 350 mL PEEP:  [12 cmH20] 12 cmH20 Plateau Pressure:  [31 cmH20-37 cmH20] 36 cmH20   Intake/Output Summary (Last 24 hours) at 06/22/2020 0914 Last data filed at 06/22/2020 0900 Gross per 24 hour  Intake 2895.62 ml  Output 4661 ml  Net -1765.38 ml   Filed Weights   06/20/20 0433 06/21/20 0409 06/22/20 0414  Weight: 90.4 kg 83.5 kg 83 kg    Examination:   General: overweight middle  aged female on vent HEENT: [place]/AT, PERRL, no JVD Neuro: sedated, paralyzed CV: RRR, no MRG PULM: Clear, CT chamber with 3/6 air leak GI: Soft, non-distended.  Extremities: warm/dry, non-pitting lower extremity edema Skin: no rashes or lesions, warm dry and intact  Resolved Hospital Problem list   Epistaxis Circulatory vs Septic Shock  Assessment & Plan:   Acute hypoxic/hypercapnic respiratory failure and sepsis (POA) due to severe ARDS from COVID19 pneumonia  P: Continue ventilator support with lung protective strategies per ARDS protocol Plateau pressures goal less than 30 cm H2O Driving pressure goal less than 15 cm H2O VAP bundle in place Ongoing NMB with vecuronium and prone positioning for P/F ratio less than 150 PAD protocol; continuous Fentanyl, Versed, and Vecuronium. CRRT for volume removal Remains quite hypercarbic, but rate and Vt are maximized already.  Check ABG in supine position  Left Apical Pneumothorax P: Routine chest tube care  Flush pigtail chest tube    Shock: septic related to viral infection vs sedative medication effect.  At this point the latter is far more likely.  P: Levophed for MAP goal 65  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 P: Continue SSI (resistant coverage) Tradjenta Tube feed coverage  Increase lantus to 20 units BID CBG q4hrs  Weaning steroids substantially   HTN -Home medications include Diltiazem and HCTZ P: Diltiazem DC Hold home HCTZ  Acute Urinary retention with oliguric AKI -in the setting of likely ATN in the setting of COVID/shock . Creatine has doubled since 9/24 currently 3.41 with worsening renal output P: CRRT per nephrology,  pulling roughly 161mL / hr Follow chemistries.  Best practice:  Diet: tube feeds Pain/Anxiety/Delirium protocol (if indicated): deep sedation as above VAP protocol (if indicated): n/a DVT prophylaxis: lovenox GI prophylaxis: n/a Glucose control: Lantus and  SSI Mobility: bed rest Code Status: full Family Communication: Family conference call today at 1130 Disposition: to ICU  Labs   CBC: Recent Labs  Lab 06/16/20 0746 06/16/20 0746 06/17/20 0745 06/17/20 1114 06/18/20 0331 06/18/20 1516 06/19/20 0409 06/19/20 0908 06/19/20 1532 06/20/20 0424 06/20/20 1113 06/21/20 0425 06/21/20 1149  WBC 28.9*   < > 18.7*  --  14.8*  --  10.0  --   --  10.3  --  8.3  --   NEUTROABS 27.1*  --   --   --   --   --   --   --   --   --   --   --   --   HGB 14.2   < > 12.2   < > 11.5*   < > 10.3*   < > 9.5* 11.3* 10.5* 11.3* 11.6*  HCT 46.7*   < > 40.5   < > 38.3   < > 33.2*   < > 28.0* 36.2 31.0* 37.1 34.0*  MCV 95.9   < > 96.4  --  96.2  --  96.2  --   --  92.1  --  94.4  --   PLT 307   < > 160  --  150  --  125*  --   --  152  --  121*  --    < > = values in this interval not displayed.    Basic Metabolic Panel: Recent Labs  Lab 06/16/20 1840 06/16/20 1840 06/17/20 0308 06/17/20 0745 06/20/20 0424 06/20/20 1113 06/20/20 1644 06/21/20 0425 06/21/20 0426 06/21/20 1149 06/21/20 1600 06/22/20 0415  NA  --   --   --    < > 134*   < > 133*  --  133* 133* 134* 132*  K  --   --   --    < > 4.9   < > 4.9  --  4.8 5.0 5.2* 5.1  CL  --   --   --    < > 99  --  101  --  98  --  103 101  CO2  --   --   --    < > 24  --  24  --  26  --  24 23  GLUCOSE  --   --   --    < > 139*  --  394*  --  312*  --  267* 267*  BUN  --   --   --    < > 88*  --  61*  --  43*  --  39* 41*  CREATININE  --   --   --    < > 1.89*  --  1.49*  --  1.22*  --  1.18* 1.15*  CALCIUM  --   --   --    < > 8.4*  --  7.7*  --  8.3*  --  8.4* 8.6*  MG 2.7*  --  3.3*  --  3.2*  --   --  2.6*  --   --   --  2.6*  PHOS 5.5*   < > 6.5*   < > 4.8*  --  3.8  --  3.2  --  3.1 2.7   < > = values in this interval not displayed.   GFR: Estimated Creatinine Clearance: 48.5 mL/min (A) (by C-G formula based on SCr of 1.15 mg/dL (H)). Recent Labs  Lab 06/18/20 0331 06/19/20 0409  06/20/20 0424 06/21/20 0425  WBC 14.8* 10.0 10.3 8.3    Liver Function Tests: Recent Labs  Lab 06/20/20 0424 06/20/20 1644 06/21/20 0426 06/21/20 1600 06/22/20 0415  AST 33  --   --   --   --   ALT 90*  --   --   --   --   ALKPHOS 94  --   --   --   --   BILITOT 0.6  --   --   --   --   PROT 6.2*  --   --   --   --   ALBUMIN 2.6* 2.4* 2.7* 2.6* 2.7*   No results for input(s): LIPASE, AMYLASE in the last 168 hours. No results for input(s): AMMONIA in the last 168 hours.  ABG    Component Value Date/Time   PHART 7.127 (LL) 06/21/2020 1149   PCO2ART 81.2 (HH) 06/21/2020 1149   PO2ART 51 (L) 06/21/2020 1149   HCO3 27.0 06/21/2020 1149   TCO2 29 06/21/2020 1149   ACIDBASEDEF 4.0 (H) 06/21/2020 1149   O2SAT 73.0 06/21/2020 1149     Coagulation Profile: No results for input(s): INR, PROTIME in the last 168 hours.  Cardiac Enzymes: No results for input(s): CKTOTAL, CKMB, CKMBINDEX, TROPONINI in the last 168 hours.  HbA1C: Hemoglobin A1C  Date/Time Value Ref Range Status  07/10/2012 06:44 PM 5.8  Final   Hgb A1c MFr Bld  Date/Time Value Ref Range Status  05/28/2020 04:00 AM 6.9 (H) 4.8 - 5.6 % Final    Comment:    (NOTE) Pre diabetes:          5.7%-6.4%  Diabetes:              >6.4%  Glycemic control for   <7.0% adults with diabetes     CBG: Recent Labs  Lab 06/21/20 1142 06/21/20 1939 06/21/20 2303 06/22/20 0342 06/22/20 0724  GLUCAP 309* 301* 309* 277* 215*    Critical care time 50 mins  Georgann Housekeeper, AGACNP-BC [place] for personal pager PCCM on call pager [phone]  06/22/2020 9:14 AM

## 2020-06-22 NOTE — Progress Notes (Signed)
OT Cancellation Note  Patient Details Name: Claudia Vega MRN: [id number] DOB: June 26, 1961   Cancelled Treatment:    Reason Eval/Treat Not Completed: Patient not medically ready (Continues to be intubated, sedated, and on CRRT. Will sign off at this time. Please re-consult as pt medically ready. Thank you.)  [place], OTR/L Acute Rehab Pager: [phone] Office: [phone] 06/22/2020, 9:15 AM

## 2020-06-22 DEATH — deceased

## 2020-06-23 ENCOUNTER — Inpatient Hospital Stay (HOSPITAL_COMMUNITY): Payer: 59

## 2020-06-23 LAB — RENAL FUNCTION PANEL
Albumin: 2.8 g/dL — ABNORMAL LOW (ref 3.5–5.0)
Anion gap: 14 (ref 5–15)
BUN: 27 mg/dL — ABNORMAL HIGH (ref 6–20)
CO2: 22 mmol/L (ref 22–32)
Calcium: 8.7 mg/dL — ABNORMAL LOW (ref 8.9–10.3)
Chloride: 100 mmol/L (ref 98–111)
Creatinine, Ser: 0.81 mg/dL (ref 0.44–1.00)
GFR calc Af Amer: >60 mL/min (ref >60)
GFR calc non Af Amer: >60 mL/min (ref >60)
Glucose, Bld: 200 mg/dL — ABNORMAL HIGH (ref 70–99)
Phosphorus: 4.4 mg/dL (ref 2.5–4.6)
Potassium: 4.6 mmol/L (ref 3.5–5.1)
Sodium: 136 mmol/L (ref 135–145)

## 2020-06-23 LAB — POCT I-STAT 7, (LYTES, BLD GAS, ICA,H+H)
Acid-base deficit: 7 mmol/L — ABNORMAL HIGH (ref 0.0–2.0)
Bicarbonate: 24.9 mmol/L (ref 20.0–28.0)
Calcium, Ion: 1.24 mmol/L (ref 1.15–1.40)
HCT: 39 % (ref 36.0–46.0)
Hemoglobin: 13.3 g/dL (ref 12.0–15.0)
O2 Saturation: 95 %
Patient temperature: 90.6
Potassium: 4.6 mmol/L (ref 3.5–5.1)
Sodium: 135 mmol/L (ref 135–145)
TCO2: 28 mmol/L (ref 22–32)
pCO2 arterial: 72.1 mmHg (ref 32.0–48.0)
pH, Arterial: 7.118 — CL (ref 7.350–7.450)
pO2, Arterial: 89 mmHg (ref 83.0–108.0)

## 2020-06-23 LAB — CBC
HCT: 42 % (ref 36.0–46.0)
Hemoglobin: 13.3 g/dL (ref 12.0–15.0)
MCH: 29.4 pg (ref 26.0–34.0)
MCHC: 31.7 g/dL (ref 30.0–36.0)
MCV: 92.9 fL (ref 80.0–100.0)
Platelets: 90 10*3/uL — ABNORMAL LOW (ref 150–400)
RBC: 4.52 MIL/uL (ref 3.87–5.11)
RDW: 13.2 % (ref 11.5–15.5)
WBC: 12.6 10*3/uL — ABNORMAL HIGH (ref 4.0–10.5)
nRBC: 9.1 % — ABNORMAL HIGH (ref 0.0–0.2)

## 2020-06-23 LAB — PHOSPHORUS: Phosphorus: 4.4 mg/dL (ref 2.5–4.6)

## 2020-06-23 LAB — GLUCOSE, CAPILLARY
Glucose-Capillary: 170 mg/dL — ABNORMAL HIGH (ref 70–99)
Glucose-Capillary: 170 mg/dL — ABNORMAL HIGH (ref 70–99)

## 2020-06-23 LAB — MAGNESIUM: Magnesium: 3 mg/dL — ABNORMAL HIGH (ref 1.7–2.4)

## 2020-06-23 MED ORDER — VANCOMYCIN HCL 750 MG/150ML IV SOLN: 750.0000 mg | INTRAVENOUS | Status: DC

## 2020-06-23 MED ORDER — VANCOMYCIN HCL 1500 MG/300ML IV SOLN
1500.0000 mg | Freq: Once | INTRAVENOUS | Status: AC
  Administered 2020-06-23: 1500 mg via INTRAVENOUS
  Filled 2020-06-23: qty 300

## 2020-06-23 MED ORDER — SODIUM CHLORIDE 0.9 % IV SOLN: 2.0000 g | Freq: Two times a day (BID) | INTRAVENOUS | Status: DC

## 2020-06-23 MED ORDER — SODIUM CHLORIDE 0.9 % IV SOLN
2.0000 g | Freq: Once | INTRAVENOUS | Status: AC
  Administered 2020-06-23: 2 g via INTRAVENOUS
  Filled 2020-06-23: qty 2

## 2020-06-26 LAB — PATHOLOGIST SMEAR REVIEW

## 2020-07-09 ENCOUNTER — Ambulatory Visit: Payer: Managed Care, Other (non HMO) | Admitting: Allergy and Immunology

## 2020-07-23 NOTE — Progress Notes (Addendum)
After discussion with this RN and Dr. Tacy Learn husband Tauni Sanks has decided to change to comfort care approach at this time.

## 2020-07-23 NOTE — Progress Notes (Signed)
Patient ID: Claudia Vega, female   DOB: 1961/05/31, 59 y.o.   MRN: [id number] As I was getting ready to enter the COVID-19 unit, I was informed by the RN that the patient had critical hypotension/hemodynamic instability earlier this morning and is now transitioned to comfort measures only.  Renal service will be available as needed. Elmarie Shiley MD [facility]. Office # [phone] Pager # [phone] 10:11 AM

## 2020-07-23 NOTE — Progress Notes (Signed)
Pharmacy Antibiotic Note  Claudia Vega is a 59 y.o. female admitted on 06/18/2020 with sepsis.  Pharmacy has been consulted for Vancomycin and Cefepime dosing. Noted pt on CRRT.  Plan: Cefepime 2gm IV q12h Vancomycin 1500mg  now then 750 mg IV Q 24 hrs.  Will f/u CRRT tolerance, micro data, and pt's clinical condition Vanc levels prn   Height: 4\' 11"  (149.9 cm) Weight: 83 kg (182 lb 15.7 oz) IBW/kg (Calculated) : 43.2  Temp (24hrs), Avg:95.3 F (35.2 C), Min:90.7 F (32.6 C), Max:100.1 F (37.8 C)  Recent Labs  Lab 06/17/20 0745 06/17/20 0745 06/18/20 0331 06/18/20 0331 06/19/20 0409 06/19/20 1644 06/20/20 0424 06/20/20 0424 06/20/20 1644 06/21/20 0425 06/21/20 0426 06/21/20 1600 06/22/20 0415 06/22/20 1605  WBC 18.7*  --  14.8*  --  10.0  --  10.3  --   --  8.3  --   --   --   --   CREATININE 2.40*   < > 2.75*   < > 3.41*   < > 1.89*   < > 1.49*  --  1.22* 1.18* 1.15* 0.97   < > = values in this interval not displayed.    Estimated Creatinine Clearance: 57.5 mL/min (by C-G formula based on SCr of 0.97 mg/dL).    Allergies  Allergen Reactions  . Latex Rash and Anaphylaxis  . Other     BEE STINGS  . Avelox [Moxifloxacin Hcl In Nacl] Itching and Rash    Antimicrobials this admission: Vanc 10/2 >>  Cefepime 9/19>> 9/24; restart 10/2>> Remdesivir 9/5>>9/9 Baricitinib 9/7>9/18  9/5 BCx: negative 9/7 MRSA PCR negative 10/2 BCx:   Thank you for allowing pharmacy to be a part of this patient's care.  Sherlon Handing, PharmD, BCPS Please see amion for complete clinical pharmacist phone list 2020/06/26 2:17 AM

## 2020-07-23 NOTE — Progress Notes (Signed)
Patient husband, Brucha Ahlquist would like no escalation of care. Per husband, he will like his wife to be comfortable and does not want to "prolong the inevitable." He wants to keep her on current medications and treatments. He would like to be notified if his wife blood pressure drops significantly or has a significant change in medical condition. All questions answered at this time.

## 2020-07-23 NOTE — Death Summary Note (Signed)
DEATH SUMMARY   Patient Details  Name: Claudia Vega MRN: [id number] DOB: Feb 28, 1961  Admission/Discharge Information   Admit Date:  June 14, 2020  Date of Death: Date of Death: 07-11-20  Time of Death: Time of Death: 12  Length of Stay: January 04, 2023  Referring Physician: Greig Right, MD   Reason(s) for Hospitalization  Acute hypoxic/hypercapnic respiratory failure due to ARDS from COVID-19 pneumonia AKI requiring CRRT Hyponatremia Left apical pneumothorax s/p chest tube placement Undifferentiated shock Poorly controlled diabetes type 2  Diagnoses  Preliminary cause of death:  Secondary Diagnoses (including complications and co-morbidities):  Principal Problem:   COVID-19 virus infection Active Problems:   HTN (hypertension)   Asthma, allergic   AKI (acute kidney injury) ([place])   Chest tube in place   Primary spontaneous pneumothorax   Pressure injury of skin   Pneumothorax   Brief Hospital Course (including significant findings, care, treatment, and services provided and events leading to death)  Claudia Vega is a 59 y.o. year old female who was admitted for Covid pneumonia on June 14, 2020 treated with remdesivir, Solu-Medrol, baricitinib developed worsening hypoxemia on June 10, 2020 so pulmonary and critical care medicine was consulted.  Has been on CPAP alternating with high flow nasal cannula/nonrebreather mask. Intubated 9/24 Patient's condition slowly deteriorated, requiring maximum ventilatory setting, she developed respiratory acidosis despite adjustment to the ventilatory setting, course was complicated by development of AKI and metabolic acidosis requiring CRRT.  Over the last few days patient's pressors requirement has increased, due to respiratory acidosis as she was not able to ventilate likely due to fibroproliferative stage of ARDS.  Patient's family was contacted, she was made DNR and she died on 07/11/2020 at 10:09 AM.  Patient's family was at  bedside    Pertinent Labs and Studies  Significant Diagnostic Studies DG Chest 1 View  Result Date: 06/17/2020 CLINICAL DATA:  New left chest tube. EXAM: CHEST  1 VIEW COMPARISON:  June 16, 2020 FINDINGS: Endotracheal tube terminates in the left mainstem bronchus. Retraction by approximately 4 cm is recommended. A second chest tube has been placed on the left with decreased in volume left pneumothorax. Left IJ approach central venous catheter is in stable position. Cardiomediastinal silhouette is normal. Mediastinal contours appear intact. Persistent diffuse airspace consolidation in bilateral lungs with lower lobe predominance. Osseous structures are without acute abnormality. Soft tissues are grossly normal. IMPRESSION: 1. Endotracheal tube terminates in the left mainstem bronchus. Retraction by approximately 4 cm is recommended. 2. Interval placement of a second left chest tube with decreased in volume left pneumothorax. 3. Persistent diffuse airspace consolidation in bilateral lungs. These results were called by telephone at the time of interpretation on 06/17/2020 at 5:13 pm to provider [facility] , who verbally acknowledged these results. 1. Electronically Signed   By: Fidela Salisbury M.D.   On: 06/17/2020 17:11   CT ANGIO CHEST PE W OR WO CONTRAST  Result Date: 06/06/2020 CLINICAL DATA:  COVID-19, with hypoxia and shortness of breath, positive D dimer, suspected pulmonary embolus. EXAM: CT ANGIOGRAPHY CHEST WITH CONTRAST TECHNIQUE: Multidetector CT imaging of the chest was performed using the standard protocol during bolus administration of intravenous contrast. Multiplanar CT image reconstructions and MIPs were obtained to evaluate the vascular anatomy. CONTRAST:  27mL OMNIPAQUE IOHEXOL 350 MG/ML SOLN COMPARISON:  Chest x-ray 06/05/2020. CT abdomen pelvis 10/21/2019 FINDINGS: Cardiovascular: Satisfactory opacification of the pulmonary arteries to the segmental level. The main pulmonary  artery is normal in caliber. No evidence of pulmonary embolism.  Normal heart size. No significant pericardial effusion. Small to moderate volume hiatal hernia. Mediastinum/Nodes: Prominent mediastinal lymph nodes. No enlarged mediastinal, hilar, or axillary lymph nodes. Thyroid gland, trachea, and esophagus demonstrate no significant findings. Lungs/Pleura: Slightly expiratory phase of respiration. Diffuse ground-glass airspace opacity with more consolidative opacity in bilateral lower lobes. Mosaic attenuation of the lung best appreciated in the right upper lung zone. Superimposed mild septal wall thickening as well as bilateral lower lobe mild tubular bronchiectasis. Upper Abdomen: No acute abnormality. Musculoskeletal: Nonspecific 7 mm soft tissue density within the right breast likely representing an intramammary lymph node (6:46). Nonspecific 1 cm soft tissue density within the left breast (6:36) in a patient with little glandular tissue. No acute or significant osseous findings. Review of the MIP images confirms the above findings. IMPRESSION: 1. No pulmonary embolus. 2. Mosaic attenuation and ground-glass airspace opacity with more consolidative bibasilar findings as well as tubular bronchiectasis is consistent with known COVID pneumonia with superimposed ARDS not excluded. In the setting of protracted COVID pneumonia, presence of infectious or inflammatory fibrosis is possible. Consider long-term CT follow-up. 3. Other imaging findings of potential clinical significance: Couple of soft tissue densities within bilateral breast in a patient with minimal glandular tissue; correlate with prior mammography or if clinically indicated follow-up with non-emergent mammographic evaluation. Small to moderate volume hiatal hernia. Electronically Signed   By: Iven Finn M.D.   On: 06/06/2020 16:54   DG CHEST PORT 1 VIEW  Result Date: 07/05/2020 CLINICAL DATA:  Pneumothorax EXAM: PORTABLE CHEST 1 VIEW COMPARISON:   06/21/2020 FINDINGS: Unchanged position of endotracheal tube just above the carina. Bilateral IJ approach central venous catheters are unchanged position. The enteric tube courses below the field of view. There is bibasilar atelectasis. Cardiomediastinal contours are normal. There is a small left apical pneumothorax. Unchanged position of left pleural catheter. IMPRESSION: 1. Small left apical pneumothorax. 2. ET tube tip remains near the carina and may need to be retracted. Electronically Signed   By: Ulyses Jarred M.D.   On: July 05, 2020 02:09   DG CHEST PORT 1 VIEW  Result Date: 06/21/2020 CLINICAL DATA:  History of pneumothorax. EXAM: PORTABLE CHEST 1 VIEW COMPARISON:  06/20/2020. FINDINGS: Endotracheal tube tip is at the lower portion of the carina. Proximal repositioning approximately 3 cm suggested. Feeding tube, right and left IJ lines, and left chest tube in stable position. Heart size stable. Persistent bibasilar infiltrates. No pleural effusion. No pneumothorax noted on today's exam. Improved left chest wall subcutaneous emphysema. IMPRESSION: 1. Endotracheal tube tip is at the lower portion of the carina. Proximal repositioning of approximately 3 cm suggested. 2. Remaining lines and tubes including left chest tube in stable position. No pneumothorax noted on today's exam. Improved left chest wall subcutaneous emphysema. 3. Persistent bibasilar infiltrates. These results will be called to the ordering clinician or representative by the Radiologist Assistant, and communication documented in the PACS or [employer]. Electronically Signed   By: Marcello Moores  Register   On: 06/21/2020 10:22   DG CHEST PORT 1 VIEW  Result Date: 06/20/2020 CLINICAL DATA:  Respiratory failure. EXAM: PORTABLE CHEST 1 VIEW COMPARISON:  June 19, 2020. FINDINGS: Stable cardiomediastinal silhouette. Endotracheal and feeding tubes are unchanged in position. Bilateral jugular catheters are unchanged. Stable position of  left-sided chest tube with minimal left apical pneumothorax. Stable subcutaneous emphysema is seen overlying the left supraclavicular and lateral chest wall regions. Stable bibasilar subsegmental atelectasis or infiltrates are noted. Bony thorax is unremarkable. IMPRESSION: Stable support apparatus. Stable  position of left-sided chest tube with minimal left apical pneumothorax. Stable bibasilar subsegmental atelectasis or infiltrates. Electronically Signed   By: Marijo Conception M.D.   On: 06/20/2020 09:34   DG CHEST PORT 1 VIEW  Result Date: 06/19/2020 CLINICAL DATA:  Central line placement EXAM: PORTABLE CHEST 1 VIEW COMPARISON:  06/17/2020 FINDINGS: Endotracheal tube, feeding catheter, left jugular central line and right temporary dialysis catheter are noted in satisfactory position. No pneumothorax is noted on the right following central line placement. Persistent bibasilar opacities are again seen. Pigtail catheter is again noted on the left with mild residual pneumothorax identified. The overall appearance is similar to that seen on the prior exam. The more inferior chest tube on the left has been removed in the interval. IMPRESSION: No new right-sided pneumothorax is noted dental fide following central line placement. Persistent bibasilar opacities. Chest tube removal on the left inferiorly. Small residual pneumothorax is noted. Electronically Signed   By: Inez Catalina M.D.   On: 06/19/2020 14:10   DG CHEST PORT 1 VIEW  Result Date: 06/16/2020 CLINICAL DATA:  History of COVID-19, chest tube in place EXAM: PORTABLE CHEST 1 VIEW COMPARISON:  Radiograph 06/16/2020 FINDINGS: Endotracheal tube terminates in the mid trachea 3.6 cm from the carina. Transesophageal tube tip terminates below the margins of imaging, beyond the GE junction. Left IJ approach catheter tip terminates at the superior cavoatrial junction. A partially coiled left pigtail pleural drain is in place. Telemetry leads overlie the chest.  There is a residual small left apical pneumothorax despite the placement of a left chest tube/pleural drain. Additional lucency is seen along the mediastinal borders which could indicate some developing pneumomediastinum with additional lucencies at the base of the neck further supporting this hypothesis. Subcutaneous emphysema seen across the left chest wall with this is less specific and possibly related to the chest tube placement. Redemonstration of the multifocal areas of mixed consolidative and interstitial opacity throughout both lungs, slightly increased in the left lung base though possibly related to atelectatic change. Opacities otherwise unchanged from comparison. Cardiomediastinal contours are stable. No acute osseous abnormalities. IMPRESSION: 1. Residual small left apical pneumothorax despite placement of a left chest tube/pleural drain. 2. Additional lucency along the mediastinal borders and base of the neck may suggest pneumomediastinum. 3. Subcutaneous emphysema along the left chest wall is less specific. Possibly related to tube placement. 4. Persistent multifocal areas of mixed consolidative and interstitial opacity throughout both lungs, slightly increased in the left lung base compatible with history of COVID 19 positivity. Electronically Signed   By: Lovena Le M.D.   On: 06/16/2020 19:34   DG CHEST PORT 1 VIEW  Result Date: 06/16/2020 CLINICAL DATA:  Status post central line placement EXAM: PORTABLE CHEST 1 VIEW COMPARISON:  06/15/2020 FINDINGS: Cardiac shadow is stable. Endotracheal tube, feeding catheter and left jugular central line are noted in satisfactory position. A new left-sided pneumothorax is noted however likely related to the catheter placement. Diffuse bilateral airspace opacities are again seen consistent with the given clinical history of COVID-19 positivity. IMPRESSION: New left-sided pneumothorax with approximately 2 cm excursion on frontal film. New left-sided central  line in satisfactory position. Stable bilateral airspace opacities consistent with the given clinical history of COVID-19 positivity. Critical Value/emergent results were called by telephone at the time of interpretation on 06/16/2020 at 4:16 pm to [facility], the pts nurse, who verbally acknowledged these results and will contact the pts doctor. Electronically Signed   By: Linus Mako.D.  On: 06/16/2020 16:18   DG CHEST PORT 1 VIEW  Result Date: 06/15/2020 CLINICAL DATA:  Endotracheal tube and feeding tube placement. EXAM: PORTABLE CHEST 1 VIEW COMPARISON:  Chest radiograph 06/11/2020.  Chest CT 06/07/2019 FINDINGS: Endotracheal tube tip 2.9 cm from the carina. Enteric tube below the diaphragm not included on this chest field of view. Lung volumes are low. Diffuse hazy and ground-glass opacity appears similar to prior exam, with more streaky opacity at the lung bases. No visualized pneumothorax or pneumomediastinum. There may be subcutaneous emphysema in the right neck versus artifact. Overlying artifacts. IMPRESSION: 1. Endotracheal tube tip 2.9 cm from the carina. Enteric tube below the diaphragm not included on this chest field of view. 2. Unchanged diffuse hazy and ground-glass opacity with more streaky bibasilar opacity unchanged from prior exam. Electronically Signed   By: Keith Rake M.D.   On: 06/15/2020 17:58   DG Chest Port 1 View  Result Date: 06/11/2020 CLINICAL DATA:  Respiratory failure, COVID-19 pneumonia EXAM: PORTABLE CHEST 1 VIEW COMPARISON:  06/10/2020 FINDINGS: Lung volumes are small, but are symmetric. Pulmonary insufflation remain stable since prior examination. Diffuse ground-glass pulmonary infiltrate has progressed slightly in the interval since prior examination. No pneumothorax or pleural effusion. Cardiac size within normal limits. No acute bone abnormality. IMPRESSION: Progressive pulmonary infiltrate.  Stable pulmonary hypoinflation. Electronically Signed   By: Fidela Salisbury  MD   On: 06/11/2020 05:43   DG CHEST PORT 1 VIEW  Result Date: 06/10/2020 CLINICAL DATA:  59 year old female COVID-59.  Respiratory failure. EXAM: PORTABLE CHEST 1 VIEW COMPARISON:  Chest CTA 06/06/2020 and earlier. FINDINGS: Portable AP upright view at 1736 hours. Stable lung volumes with mild elevation of the right hemidiaphragm. Mediastinal contours remain normal. Visualized tracheal air column is within normal limits. No pneumothorax or pleural effusion. Increased bilateral pulmonary interstitial markings are stable since 07-04-20 and most pronounced at the lung bases. No areas of worsening ventilation. Negative visible bowel gas pattern. No acute osseous abnormality identified. IMPRESSION: 1. Stable bilateral COVID-19 pneumonia since 04-Jul-2020. 2. No new cardiopulmonary abnormality. Electronically Signed   By: Genevie Ann M.D.   On: 06/10/2020 17:59   DG Chest Port 1 View  Result Date: 07-04-2020 CLINICAL DATA:  COVID, hypoxia, shortness of breath EXAM: PORTABLE CHEST 1 VIEW COMPARISON:  06/03/2020 FINDINGS: Improving aeration bilaterally. Mild residual interstitial prominence and airspace disease bilaterally, left greater than right. Low lung volumes. Heart is borderline in size. No effusions or pneumothorax. IMPRESSION: Improving aeration with continued mild interstitial and alveolar opacities, left greater than right. Electronically Signed   By: Rolm Baptise M.D.   On: 07/04/2020 08:43   DG Chest Port 1 View  Result Date: 06/03/2020 CLINICAL DATA:  Follow-up COVID-19 pneumonia. EXAM: PORTABLE CHEST 1 VIEW COMPARISON:  05/26/2020 and earlier. FINDINGS: Cardiac silhouette upper normal in size for AP portable technique. Patchy ground-glass opacities throughout the LEFT lung and in the RIGHT mid lung and RIGHT lung base are unchanged since the examination 1 week ago. No new pulmonary parenchymal abnormalities. No visible pleural effusions. IMPRESSION: Stable pneumonia throughout the LEFT lung and in  the RIGHT mid lung and RIGHT lung base since the examination 1 week ago. No new abnormalities. Electronically Signed   By: Evangeline Dakin M.D.   On: 06/03/2020 09:28   DG Chest Port 1 View  Result Date: 06/10/2020 CLINICAL DATA:  Cough, COVID, hypoxia EXAM: PORTABLE CHEST 1 VIEW COMPARISON:  11/07/2018 FINDINGS: Lung volumes are small. Bibasilar asymmetric moderate pulmonary infiltrates are  present in keeping with atypical infection and compatible with the given history of COVID-19 pneumonia. No pneumothorax or pleural effusion. Cardiac size within normal limits. Pulmonary vascularity is normal. No acute bone abnormality. IMPRESSION: Bibasilar asymmetric moderate pulmonary infiltrates compatible with the given history of COVID-19 pneumonia. Electronically Signed   By: Fidela Salisbury MD   On: 05/30/2020 20:23   DG Abd Portable 1V  Result Date: 06/15/2020 CLINICAL DATA:  Feeding tube placement. EXAM: PORTABLE ABDOMEN - 1 VIEW COMPARISON:  None. FINDINGS: Tip of the weighted enteric tube in the right upper quadrant in the region of the distal stomach or proximal duodenum. There is gaseous gastric distension. Overlying monitoring device in place. Mild gaseous distention of bowel loops in the upper abdomen. IMPRESSION: Tip of the weighted enteric tube in the right upper quadrant in the region of the distal stomach or proximal duodenum. Gaseous gastric distension. Electronically Signed   By: Keith Rake M.D.   On: 06/15/2020 17:59   VAS [place] LOWER EXTREMITY VENOUS (DVT)  Result Date: 06/06/2020  Lower Venous DVTStudy Indications: D-dimer.  Limitations: Patient intolerant of compressions mid-dist thigh. Comparison Study: No prior studies. Performing Technologist: Darlin Coco  Examination Guidelines: A complete evaluation includes B-mode imaging, spectral Doppler, color Doppler, and power Doppler as needed of all accessible portions of each vessel. Bilateral testing is considered an integral part of a  complete examination. Limited examinations for reoccurring indications may be performed as noted. The reflux portion of the exam is performed with the patient in reverse Trendelenburg.  +---------+---------------+---------+-----------+----------+--------------+ RIGHT    CompressibilityPhasicitySpontaneityPropertiesThrombus Aging +---------+---------------+---------+-----------+----------+--------------+ CFV      Full           Yes      Yes                                 +---------+---------------+---------+-----------+----------+--------------+ SFJ      Full                                                        +---------+---------------+---------+-----------+----------+--------------+ FV Prox  Full                                                        +---------+---------------+---------+-----------+----------+--------------+ FV Mid   Full                                                        +---------+---------------+---------+-----------+----------+--------------+ FV DistalFull                                                        +---------+---------------+---------+-----------+----------+--------------+ PFV      Full                                                        +---------+---------------+---------+-----------+----------+--------------+  POP      Full           Yes      Yes                                 +---------+---------------+---------+-----------+----------+--------------+ PTV      Full                                                        +---------+---------------+---------+-----------+----------+--------------+ PERO     Full                                                        +---------+---------------+---------+-----------+----------+--------------+   +---------+---------------+---------+-----------+----------+--------------+ LEFT     CompressibilityPhasicitySpontaneityPropertiesThrombus Aging  +---------+---------------+---------+-----------+----------+--------------+ CFV      Full           Yes      Yes                                 +---------+---------------+---------+-----------+----------+--------------+ SFJ      Full                                                        +---------+---------------+---------+-----------+----------+--------------+ FV Prox  Full                                                        +---------+---------------+---------+-----------+----------+--------------+ FV Mid   Full                                                        +---------+---------------+---------+-----------+----------+--------------+ FV Distal               Yes      Yes                                 +---------+---------------+---------+-----------+----------+--------------+ PFV      Full                                                        +---------+---------------+---------+-----------+----------+--------------+ POP      Full           Yes      Yes                                 +---------+---------------+---------+-----------+----------+--------------+  PTV      Full                                                        +---------+---------------+---------+-----------+----------+--------------+ PERO     Full                                                        +---------+---------------+---------+-----------+----------+--------------+     Summary: RIGHT: - There is no evidence of deep vein thrombosis in the lower extremity.  - No cystic structure found in the popliteal fossa.  LEFT: - There is no evidence of deep vein thrombosis in the lower extremity.  - No cystic structure found in the popliteal fossa.  *See table(s) above for measurements and observations. Electronically signed by Harold Barban MD on 06/06/2020 at 5:20:35 PM.    Final     Microbiology No results found for this or any previous visit (from the past 240  hour(s)).  Lab Basic Metabolic Panel: Recent Labs  Lab 06/17/20 0308 06/17/20 0745 06/20/20 0424 06/20/20 1113 06/20/20 1644 06/21/20 0425 06/21/20 0426 06/21/20 1149 06/21/20 1600 06/21/20 1600 06/22/20 0415 06/22/20 1009 06/22/20 1605 2020-07-11 0148 July 11, 2020 0253  NA  --    < > 134*   < >   < >  --  133*   < > 134*   < > 132* 131* 135 135 136  K  --    < > 4.9   < >   < >  --  4.8   < > 5.2*   < > 5.1 5.2* 5.1 4.6 4.6  CL  --    < > 99   < >   < >  --  98  --  103  --  101  --  101  --  100  CO2  --    < > 24   < >   < >  --  26  --  24  --  23  --  24  --  22  GLUCOSE  --    < > 139*   < >   < >  --  312*  --  267*  --  267*  --  279*  --  200*  BUN  --    < > 88*   < >   < >  --  43*  --  39*  --  41*  --  33*  --  27*  CREATININE  --    < > 1.89*   < >   < >  --  1.22*  --  1.18*  --  1.15*  --  0.97  --  0.81  CALCIUM  --    < > 8.4*   < >   < >  --  8.3*  --  8.4*  --  8.6*  --  8.5*  --  8.7*  MG 3.3*  --  3.2*  --   --  2.6*  --   --   --   --  2.6*  --   --   --  3.0*  PHOS  6.5*   < > 4.8*   < >   < >  --  3.2  --  3.1  --  2.7  --  3.9  --  4.4  4.4   < > = values in this interval not displayed.   Liver Function Tests: Recent Labs  Lab 06/20/20 0424 06/20/20 1644 06/21/20 0426 06/21/20 1600 06/22/20 0415 06/22/20 1605 07-07-20 0253  AST 33  --   --   --   --   --   --   ALT 90*  --   --   --   --   --   --   ALKPHOS 94  --   --   --   --   --   --   BILITOT 0.6  --   --   --   --   --   --   PROT 6.2*  --   --   --   --   --   --   ALBUMIN 2.6*   < > 2.7* 2.6* 2.7* 2.9* 2.8*   < > = values in this interval not displayed.   No results for input(s): LIPASE, AMYLASE in the last 168 hours. No results for input(s): AMMONIA in the last 168 hours. CBC: Recent Labs  Lab 06/18/20 0331 06/18/20 1516 06/19/20 0409 06/19/20 0908 06/20/20 0424 06/20/20 1113 06/21/20 0425 06/21/20 1149 06/22/20 1009 07-Jul-2020 0148 07/07/20 0253  WBC 14.8*  --  10.0  --   10.3  --  8.3  --   --   --  12.6*  HGB 11.5*   < > 10.3*   < > 11.3*   < > 11.3* 11.6* 11.9* 13.3 13.3  HCT 38.3   < > 33.2*   < > 36.2   < > 37.1 34.0* 35.0* 39.0 42.0  MCV 96.2  --  96.2  --  92.1  --  94.4  --   --   --  92.9  PLT 150  --  125*  --  152  --  121*  --   --   --  90*   < > = values in this interval not displayed.   Cardiac Enzymes: No results for input(s): CKTOTAL, CKMB, CKMBINDEX, TROPONINI in the last 168 hours. Sepsis Labs: Recent Labs  Lab 06/19/20 0409 06/20/20 0424 06/21/20 0425 2020-07-07 0253  WBC 10.0 10.3 8.3 12.6*    Procedures/Operations     Acadia Thammavong 2020-07-07, 10:27 AM

## 2020-07-23 DEATH — deceased

## 2020-08-03 ENCOUNTER — Other Ambulatory Visit: Payer: Self-pay | Admitting: Allergy and Immunology

## 2020-08-15 ENCOUNTER — Ambulatory Visit: Payer: Managed Care, Other (non HMO) | Admitting: Neurology

## 2020-08-21 ENCOUNTER — Ambulatory Visit: Payer: Managed Care, Other (non HMO) | Admitting: Neurology

## 2022-01-21 IMAGING — DX DG CHEST 1V PORT
1 series · 1 of 1 positions shown · non-contrast
Comparison: 11/07/2018

CLINICAL DATA: Cough, COVID, hypoxia

EXAM:
PORTABLE CHEST 1 VIEW

[chest]
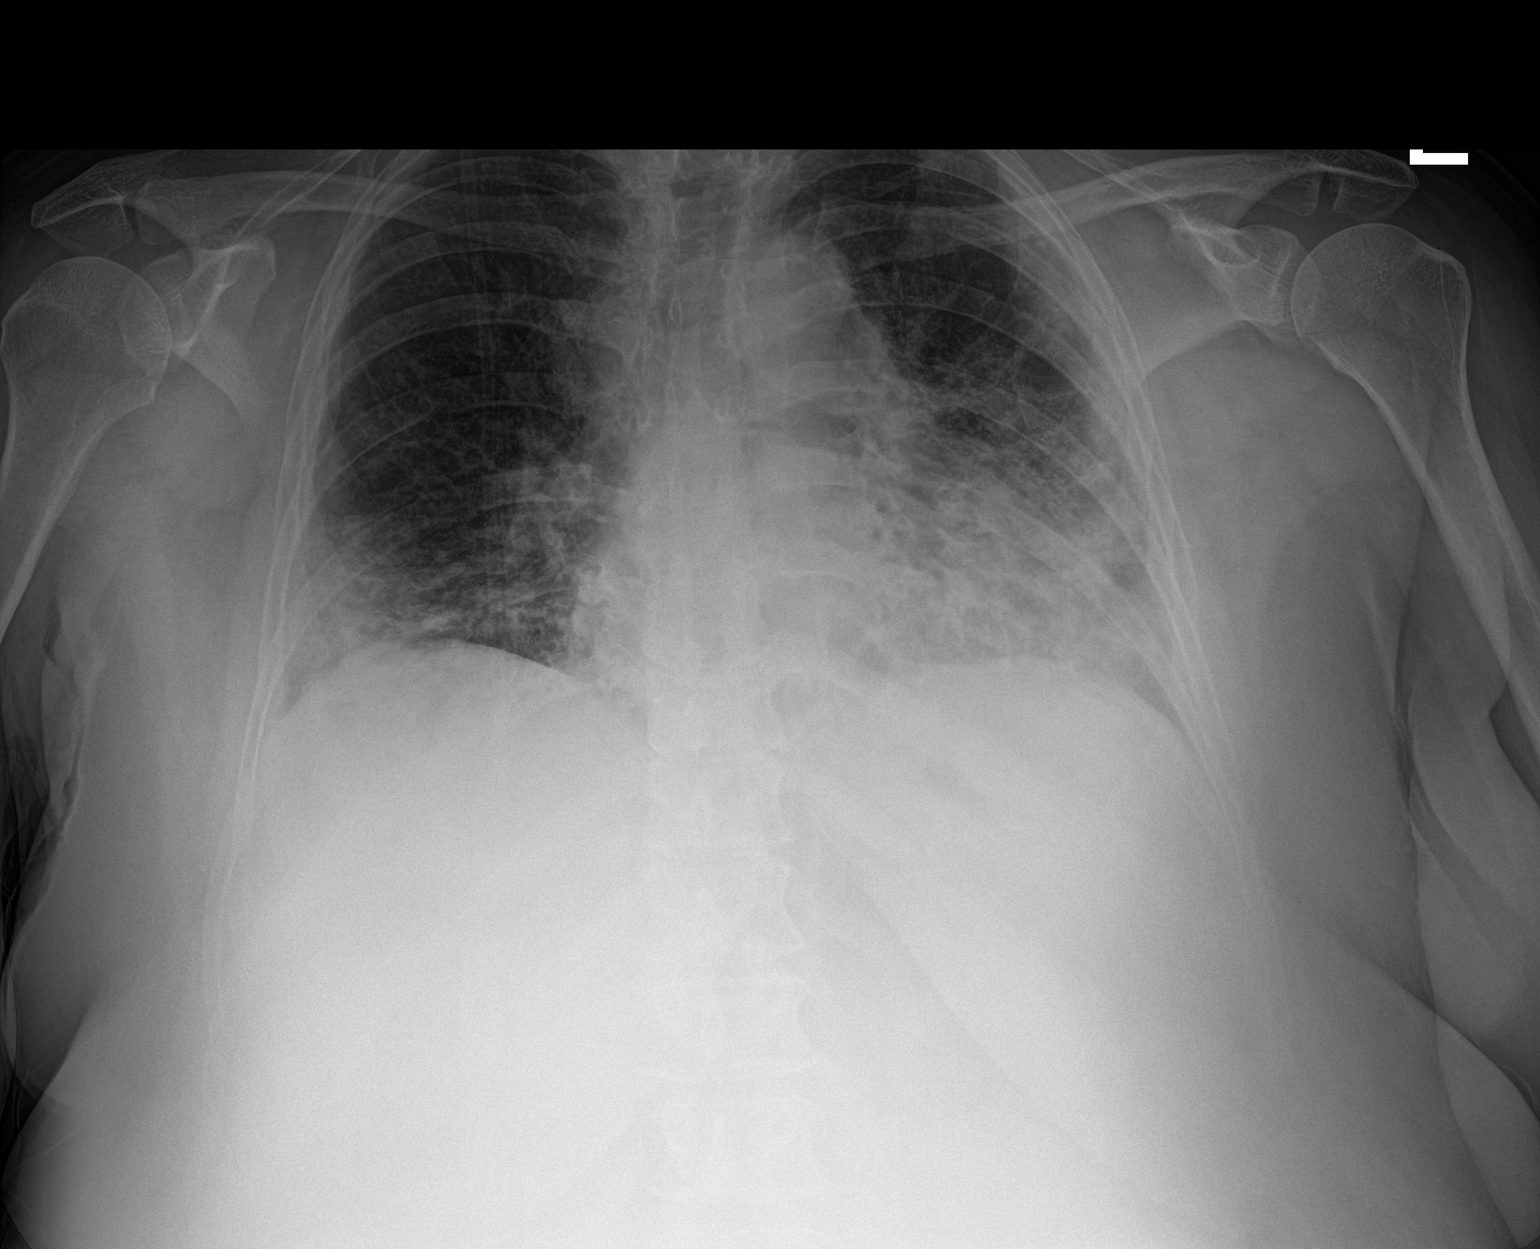

[1 of 1 positions shown; findings below may reference images not displayed]

FINDINGS: Lung volumes are small. Bibasilar asymmetric moderate pulmonary
infiltrates are present in keeping with atypical infection and
compatible with the given history of [id number] pneumonia. No
pneumothorax or pleural effusion. Cardiac size within normal limits.
Pulmonary vascularity is normal. No acute bone abnormality.
IMPRESSION: Bibasilar asymmetric moderate pulmonary infiltrates compatible with
the given history of [id number] pneumonia.

## 2022-01-30 IMAGING — DX DG CHEST 1V PORT
1 series · 1 of 1 positions shown · non-contrast
Comparison: 06/03/2020

CLINICAL DATA: COVID, hypoxia, shortness of breath

EXAM:
PORTABLE CHEST 1 VIEW

[chest ap]
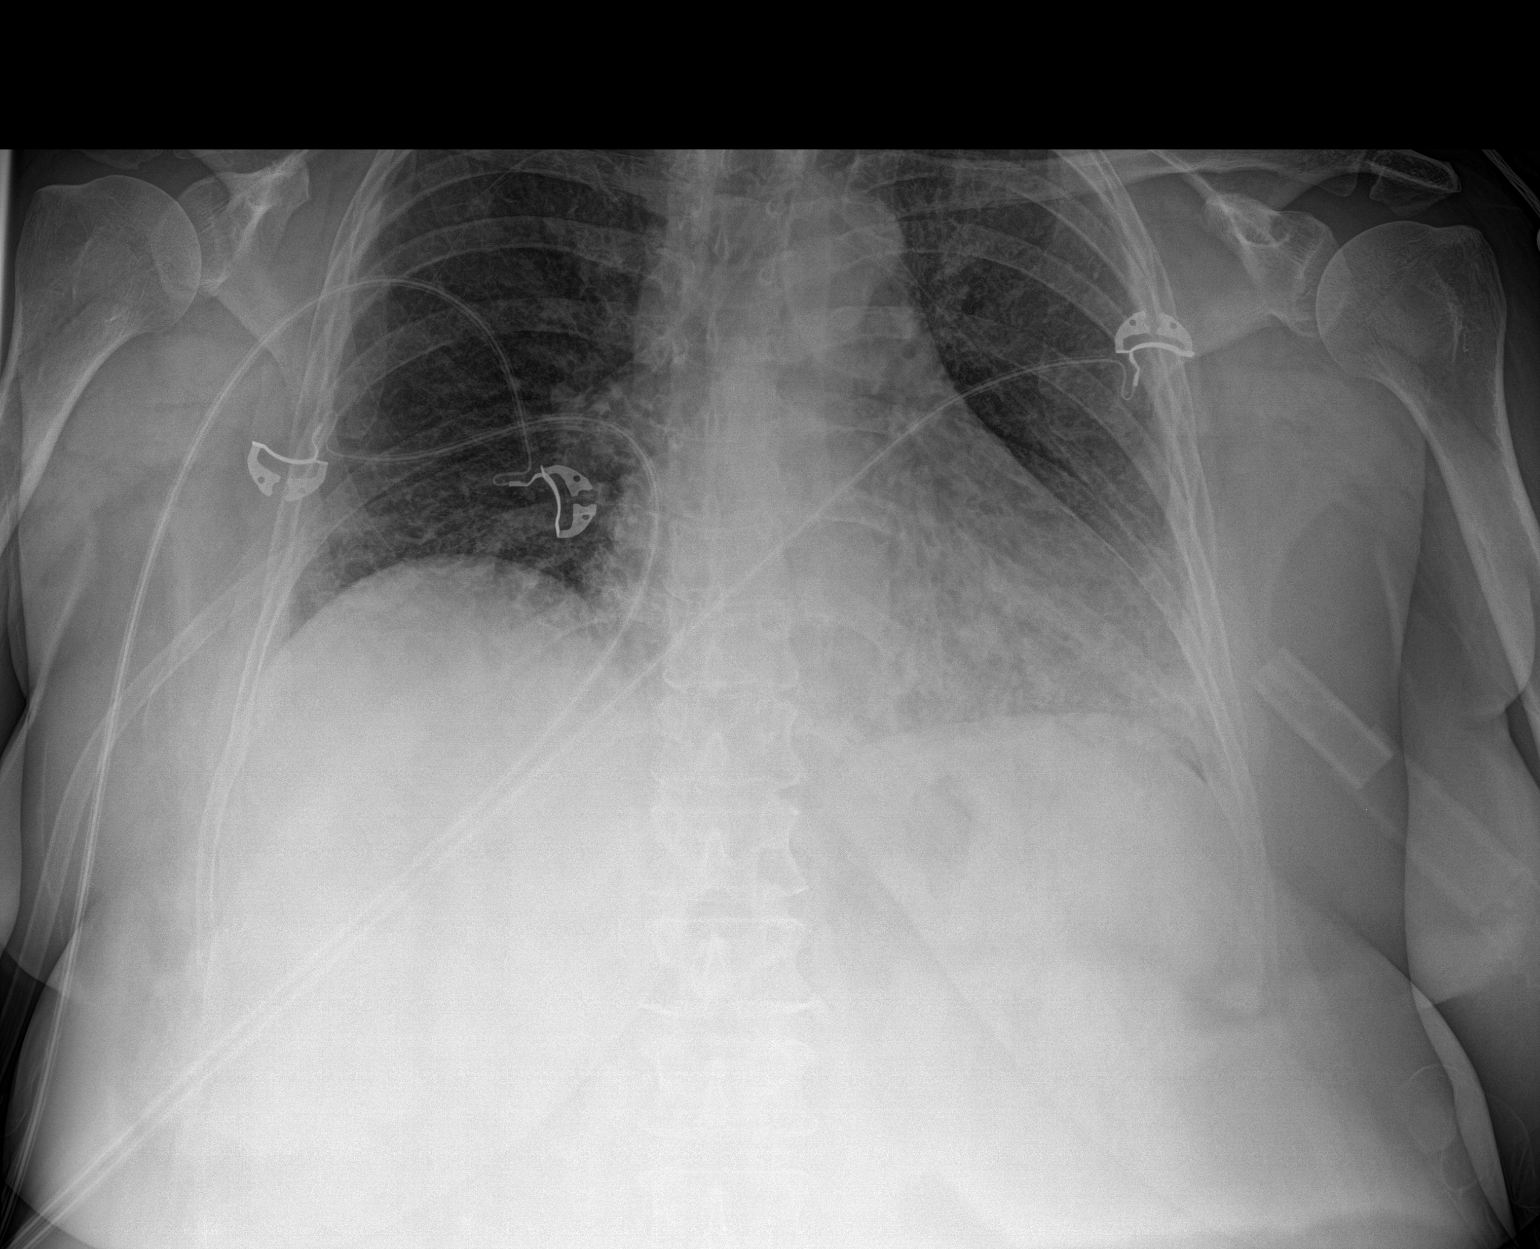

[1 of 1 positions shown; findings below may reference images not displayed]

FINDINGS: Improving aeration bilaterally. Mild residual interstitial
prominence and airspace disease bilaterally, left greater than
right. Low lung volumes. Heart is borderline in size. No effusions
or pneumothorax.
IMPRESSION: Improving aeration with continued mild interstitial and alveolar
opacities, left greater than right.
